# Patient Record
Sex: Male | Born: 1950
Health system: Southern US, Community
[De-identification: ages and names within clinical notes are randomized; demographics above are authoritative.]

## PROBLEM LIST (undated history)

## (undated) DIAGNOSIS — M1990 Unspecified osteoarthritis, unspecified site: Secondary | ICD-10-CM

## (undated) DIAGNOSIS — M7540 Impingement syndrome of unspecified shoulder: Secondary | ICD-10-CM

## (undated) DIAGNOSIS — E890 Postprocedural hypothyroidism: Secondary | ICD-10-CM

## (undated) DIAGNOSIS — G20 Parkinson's disease: Secondary | ICD-10-CM

## (undated) DIAGNOSIS — I10 Essential (primary) hypertension: Secondary | ICD-10-CM

## (undated) DIAGNOSIS — C73 Malignant neoplasm of thyroid gland: Secondary | ICD-10-CM

## (undated) DIAGNOSIS — E785 Hyperlipidemia, unspecified: Secondary | ICD-10-CM

## (undated) DIAGNOSIS — G3185 Corticobasal degeneration: Secondary | ICD-10-CM

## (undated) DIAGNOSIS — G20A1 Parkinson's disease without dyskinesia, without mention of fluctuations: Secondary | ICD-10-CM

## (undated) HISTORY — DX: Postprocedural hypothyroidism: E89.0

## (undated) HISTORY — PX: RETINAL DETACHMENT SURGERY: SHX105

## (undated) HISTORY — PX: CATARACT EXTRACTION: SUR2

## (undated) HISTORY — PX: APPENDECTOMY: SHX54

## (undated) HISTORY — DX: Impingement syndrome of unspecified shoulder: M75.40

## (undated) HISTORY — DX: Malignant neoplasm of thyroid gland: C73

## (undated) HISTORY — DX: Essential (primary) hypertension: I10

## (undated) HISTORY — DX: Hyperlipidemia, unspecified: E78.5

## (undated) HISTORY — DX: Corticobasal degeneration: G31.85

---

## 2010-10-13 ENCOUNTER — Ambulatory Visit: Payer: Self-pay | Admitting: Family Medicine

## 2012-01-24 ENCOUNTER — Ambulatory Visit: Payer: Self-pay | Admitting: Gastroenterology

## 2012-01-24 HISTORY — PX: COLONOSCOPY: SHX174

## 2012-01-25 LAB — PATHOLOGY REPORT

## 2012-03-20 ENCOUNTER — Ambulatory Visit: Payer: Self-pay | Admitting: Family Medicine

## 2012-03-21 ENCOUNTER — Ambulatory Visit: Payer: Self-pay | Admitting: Family Medicine

## 2012-04-03 ENCOUNTER — Ambulatory Visit: Payer: Self-pay | Admitting: Family Medicine

## 2012-04-04 ENCOUNTER — Ambulatory Visit: Payer: Self-pay | Admitting: Family Medicine

## 2014-06-09 ENCOUNTER — Ambulatory Visit: Payer: Self-pay | Admitting: Physician Assistant

## 2014-06-09 ENCOUNTER — Ambulatory Visit: Payer: Self-pay | Admitting: Family Medicine

## 2014-12-26 ENCOUNTER — Other Ambulatory Visit: Payer: Self-pay | Admitting: Family Medicine

## 2014-12-26 DIAGNOSIS — E785 Hyperlipidemia, unspecified: Secondary | ICD-10-CM

## 2014-12-26 DIAGNOSIS — I10 Essential (primary) hypertension: Secondary | ICD-10-CM

## 2014-12-27 ENCOUNTER — Encounter: Payer: Self-pay | Admitting: Family Medicine

## 2014-12-31 ENCOUNTER — Ambulatory Visit (INDEPENDENT_AMBULATORY_CARE_PROVIDER_SITE_OTHER): Payer: Managed Care, Other (non HMO) | Admitting: Family Medicine

## 2014-12-31 ENCOUNTER — Encounter: Payer: Self-pay | Admitting: Family Medicine

## 2014-12-31 VITALS — BP 120/70 | HR 78 | Ht 70.0 in | Wt 175.0 lb

## 2014-12-31 DIAGNOSIS — Z87438 Personal history of other diseases of male genital organs: Secondary | ICD-10-CM

## 2014-12-31 DIAGNOSIS — I10 Essential (primary) hypertension: Secondary | ICD-10-CM | POA: Diagnosis not present

## 2014-12-31 DIAGNOSIS — E785 Hyperlipidemia, unspecified: Secondary | ICD-10-CM

## 2014-12-31 DIAGNOSIS — R58 Hemorrhage, not elsewhere classified: Secondary | ICD-10-CM

## 2014-12-31 DIAGNOSIS — R69 Illness, unspecified: Secondary | ICD-10-CM

## 2014-12-31 DIAGNOSIS — Z1212 Encounter for screening for malignant neoplasm of rectum: Secondary | ICD-10-CM | POA: Diagnosis not present

## 2014-12-31 DIAGNOSIS — Z23 Encounter for immunization: Secondary | ICD-10-CM | POA: Diagnosis not present

## 2014-12-31 LAB — HEMOCCULT GUIAC POC 1CARD (OFFICE): FECAL OCCULT BLD: NEGATIVE

## 2014-12-31 MED ORDER — ATORVASTATIN CALCIUM 20 MG PO TABS: 20.0000 mg | ORAL_TABLET | Freq: Every day | ORAL | Status: DC

## 2014-12-31 MED ORDER — METOPROLOL SUCCINATE ER 100 MG PO TB24: 100.0000 mg | ORAL_TABLET | Freq: Every day | ORAL | Status: DC

## 2014-12-31 MED ORDER — VALSARTAN-HYDROCHLOROTHIAZIDE 80-12.5 MG PO TABS: 1.0000 | ORAL_TABLET | Freq: Every day | ORAL | Status: DC

## 2014-12-31 NOTE — Progress Notes (Signed)
Name: Travis Robertson   MRN: [id number]    DOB: 03/21/1951   Date:12/31/2014       Progress Note  Subjective  Chief Complaint  Chief Complaint  Patient presents with  . Annual Exam  . Hypertension  . Hyperlipidemia    Hypertension This is a recurrent problem. The current episode started more than 1 year ago. The problem has been gradually improving since onset. The problem is controlled. Pertinent negatives include no anxiety, blurred vision, chest pain, headaches, malaise/fatigue, neck pain, orthopnea, palpitations, peripheral edema, PND, shortness of breath or sweats. There are no associated agents to hypertension. Risk factors for coronary artery disease include dyslipidemia, obesity, male gender and smoking/tobacco exposure. Past treatments include angiotensin blockers, beta blockers and diuretics. The current treatment provides moderate improvement. There are no compliance problems.  There is no history of angina, kidney disease, CAD/MI, CVA, heart failure, left ventricular hypertrophy, PVD, renovascular disease or retinopathy. There is no history of chronic renal disease.  Hyperlipidemia This is a recurrent problem. The current episode started more than 1 year ago. The problem is controlled. Recent lipid tests were reviewed and are normal. Exacerbating diseases include obesity. He has no history of chronic renal disease, diabetes, hypothyroidism or liver disease. Factors aggravating his hyperlipidemia include thiazides. Pertinent negatives include no chest pain, focal sensory loss, focal weakness, leg pain, myalgias or shortness of breath. Current antihyperlipidemic treatment includes statins. The current treatment provides mild improvement of lipids. There are no compliance problems.     No problem-specific assessment & plan notes found for this encounter.   Past Medical History  Diagnosis Date  . Hyperlipidemia   . Hypertension     Past Surgical History  Procedure Laterality  Date  . Appendectomy    . Retinal detachment surgery Bilateral   . Cataract extraction Bilateral   . Colonoscopy  2015    cleared for 5 yrs- [facility]    History reviewed. No pertinent family history.  Social History   Social History  . Marital Status: Married    Spouse Name: N/A  . Number of Children: N/A  . Years of Education: N/A   Occupational History  . Not on file.   Social History Main Topics  . Smoking status: Former Research scientist (life sciences)  . Smokeless tobacco: Not on file  . Alcohol Use: 0.0 oz/week    0 Standard drinks or equivalent per week  . Drug Use: No  . Sexual Activity: No   Other Topics Concern  . Not on file   Social History Narrative  . No narrative on file    No Known Allergies   Review of Systems  Constitutional: Negative for fever, chills, weight loss and malaise/fatigue.  HENT: Negative for ear discharge, ear pain and sore throat.   Eyes: Negative for blurred vision.  Respiratory: Negative for cough, sputum production, shortness of breath and wheezing.   Cardiovascular: Negative for chest pain, palpitations, orthopnea, leg swelling and PND.  Gastrointestinal: Negative for heartburn, nausea, abdominal pain, diarrhea, constipation, blood in stool and melena.  Genitourinary: Negative for dysuria, urgency, frequency and hematuria.  Musculoskeletal: Negative for myalgias, back pain, joint pain and neck pain.  Skin: Negative for rash.  Neurological: Negative for dizziness, tingling, sensory change, focal weakness and headaches.  Endo/Heme/Allergies: Negative for environmental allergies and polydipsia. Does not bruise/bleed easily.  Psychiatric/Behavioral: Negative for depression and suicidal ideas. The patient is not nervous/anxious and does not have insomnia.      Objective  Filed Vitals:  12/31/14 0837  BP: 120/70  Pulse: 78  Height: 5\' 10"  (1.778 m)  Weight: 175 lb (79.379 kg)    Physical Exam  Constitutional: He is oriented to person, place, and  time and well-developed, well-nourished, and in no distress.  HENT:  Head: Normocephalic.  Right Ear: External ear normal.  Left Ear: External ear normal.  Nose: Nose normal.  Mouth/Throat: Oropharynx is clear and moist.  Eyes: Conjunctivae and EOM are normal. Pupils are equal, round, and reactive to light. Right eye exhibits no discharge. Left eye exhibits no discharge. No scleral icterus.  Neck: Normal range of motion. Neck supple. No JVD present. No tracheal deviation present. No thyromegaly present.  Cardiovascular: Normal rate, regular rhythm, normal heart sounds and intact distal pulses.  Exam reveals no gallop and no friction rub.   No murmur heard. Pulmonary/Chest: Breath sounds normal. No respiratory distress. He has no wheezes. He has no rales.  Abdominal: Soft. Bowel sounds are normal. He exhibits no mass. There is no hepatosplenomegaly. There is no tenderness. There is no rebound, no guarding and no CVA tenderness.  Genitourinary: Prostate normal. Guaiac positive stool.  Musculoskeletal: Normal range of motion. He exhibits no edema or tenderness.  Lymphadenopathy:    He has no cervical adenopathy.  Neurological: He is alert and oriented to person, place, and time. He has normal sensation, normal strength, normal reflexes and intact cranial nerves. No cranial nerve deficit.  Skin: Skin is warm. Ecchymosis noted. No rash noted.  Psychiatric: Mood and affect normal.      Assessment & Plan  Problem List Items Addressed This Visit      Cardiovascular and Mediastinum   Essential hypertension - Primary   Relevant Medications   aspirin 81 MG tablet   atorvastatin (LIPITOR) 20 MG tablet   metoprolol succinate (TOPROL-XL) 100 MG 24 hr tablet   valsartan-hydrochlorothiazide (DIOVAN-HCT) 80-12.5 MG per tablet   Other Relevant Orders   Renal Function Panel     Other   Hyperlipidemia   Relevant Medications   aspirin 81 MG tablet   atorvastatin (LIPITOR) 20 MG tablet    metoprolol succinate (TOPROL-XL) 100 MG 24 hr tablet   valsartan-hydrochlorothiazide (DIOVAN-HCT) 80-12.5 MG per tablet   Other Relevant Orders   Lipid Profile   Taking multiple medications for chronic disease    Other Visit Diagnoses    Ecchymosis        Relevant Orders    CBC w/Diff/Platelet    History of BPH        Relevant Orders    PSA    Need for influenza vaccination        Relevant Orders    Flu Vaccine QUAD 36+ mos PF IM (Fluarix & Fluzone Quad PF) (Completed)    Screening for rectal cancer        Relevant Orders    POCT Occult Blood Stool (Completed)         Dr. Hashim Eichhorst [facility]  12/31/2014

## 2015-01-01 LAB — RENAL FUNCTION PANEL
Albumin: 4.6 g/dL (ref 3.6–4.8)
BUN / CREAT RATIO: 17 (ref 10–22)
BUN: 16 mg/dL (ref 8–27)
CALCIUM: 9.4 mg/dL (ref 8.6–10.2)
CHLORIDE: 95 mmol/L — AB (ref 97–108)
CO2: 27 mmol/L (ref 18–29)
CREATININE: 0.93 mg/dL (ref 0.76–1.27)
GFR calc Af Amer: 100 mL/min/{1.73_m2} (ref >59)
GFR calc non Af Amer: 86 mL/min/{1.73_m2} (ref >59)
Glucose: 88 mg/dL (ref 65–99)
PHOSPHORUS: 3 mg/dL (ref 2.5–4.5)
POTASSIUM: 4.4 mmol/L (ref 3.5–5.2)
SODIUM: 138 mmol/L (ref 134–144)

## 2015-01-01 LAB — LIPID PANEL
CHOLESTEROL TOTAL: 166 mg/dL (ref 100–199)
Chol/HDL Ratio: 2.4 ratio units (ref 0.0–5.0)
HDL: 68 mg/dL (ref >39)
LDL CALC: 85 mg/dL (ref 0–99)
TRIGLYCERIDES: 63 mg/dL (ref 0–149)
VLDL Cholesterol Cal: 13 mg/dL (ref 5–40)

## 2015-01-01 LAB — CBC WITH DIFFERENTIAL/PLATELET
BASOS ABS: 0.1 10*3/uL (ref 0.0–0.2)
Basos: 1 %
EOS (ABSOLUTE): 0.5 10*3/uL — AB (ref 0.0–0.4)
Eos: 6 %
HEMATOCRIT: 41 % (ref 37.5–51.0)
Hemoglobin: 13.9 g/dL (ref 12.6–17.7)
IMMATURE GRANULOCYTES: 0 %
Immature Grans (Abs): 0 10*3/uL (ref 0.0–0.1)
LYMPHS ABS: 1.8 10*3/uL (ref 0.7–3.1)
Lymphs: 22 %
MCH: 33.3 pg — ABNORMAL HIGH (ref 26.6–33.0)
MCHC: 33.9 g/dL (ref 31.5–35.7)
MCV: 98 fL — ABNORMAL HIGH (ref 79–97)
MONOS ABS: 0.5 10*3/uL (ref 0.1–0.9)
Monocytes: 6 %
NEUTROS PCT: 65 %
Neutrophils Absolute: 5.3 10*3/uL (ref 1.4–7.0)
PLATELETS: 257 10*3/uL (ref 150–379)
RBC: 4.18 x10E6/uL (ref 4.14–5.80)
RDW: 13.3 % (ref 12.3–15.4)
WBC: 8.2 10*3/uL (ref 3.4–10.8)

## 2015-01-01 LAB — PSA: Prostate Specific Ag, Serum: 0.2 ng/mL (ref 0.0–4.0)

## 2015-06-25 ENCOUNTER — Other Ambulatory Visit: Payer: Self-pay | Admitting: Family Medicine

## 2015-06-26 ENCOUNTER — Other Ambulatory Visit: Payer: Self-pay

## 2015-07-15 ENCOUNTER — Encounter: Payer: Self-pay | Admitting: Family Medicine

## 2015-07-15 ENCOUNTER — Ambulatory Visit (INDEPENDENT_AMBULATORY_CARE_PROVIDER_SITE_OTHER): Payer: Managed Care, Other (non HMO) | Admitting: Family Medicine

## 2015-07-15 VITALS — BP 120/70 | HR 64 | Ht 70.0 in | Wt 171.0 lb

## 2015-07-15 DIAGNOSIS — I10 Essential (primary) hypertension: Secondary | ICD-10-CM

## 2015-07-15 DIAGNOSIS — E785 Hyperlipidemia, unspecified: Secondary | ICD-10-CM | POA: Diagnosis not present

## 2015-07-15 DIAGNOSIS — J0100 Acute maxillary sinusitis, unspecified: Secondary | ICD-10-CM

## 2015-07-15 MED ORDER — AMOXICILLIN 500 MG PO CAPS: 500.0000 mg | ORAL_CAPSULE | Freq: Three times a day (TID) | ORAL | Status: DC

## 2015-07-15 MED ORDER — METOPROLOL SUCCINATE ER 100 MG PO TB24: 100.0000 mg | ORAL_TABLET | Freq: Every day | ORAL | Status: DC

## 2015-07-15 MED ORDER — ATORVASTATIN CALCIUM 20 MG PO TABS: 20.0000 mg | ORAL_TABLET | Freq: Every day | ORAL | Status: DC

## 2015-07-15 MED ORDER — VALSARTAN-HYDROCHLOROTHIAZIDE 80-12.5 MG PO TABS: 1.0000 | ORAL_TABLET | Freq: Every day | ORAL | Status: DC

## 2015-07-15 NOTE — Progress Notes (Signed)
Name: Travis Robertson   MRN: [id number]    DOB: 1951/04/09   Date:07/15/2015       Progress Note  Subjective  Chief Complaint  Chief Complaint  Patient presents with  . Hypertension  . Hyperlipidemia  . Sinusitis    nasal production, cough with brown production    Hypertension This is a chronic problem. The current episode started more than 1 year ago. The problem is unchanged. The problem is controlled. Pertinent negatives include no anxiety, blurred vision, chest pain, headaches, malaise/fatigue, neck pain, orthopnea, palpitations, peripheral edema, PND, shortness of breath or sweats. There are no associated agents to hypertension. Past treatments include angiotensin blockers, beta blockers and diuretics. The current treatment provides moderate improvement. There are no compliance problems.  There is no history of angina, kidney disease, CAD/MI, CVA, heart failure, left ventricular hypertrophy, PVD, renovascular disease or retinopathy. There is no history of chronic renal disease or a hypertension causing med.  Hyperlipidemia This is a chronic problem. The problem is controlled. Recent lipid tests were reviewed and are normal. He has no history of chronic renal disease. Pertinent negatives include no chest pain, focal weakness, myalgias or shortness of breath. The current treatment provides moderate improvement of lipids. There are no compliance problems.  Risk factors for coronary artery disease include dyslipidemia and hypertension.  Sinusitis This is a new problem. The current episode started 1 to 4 weeks ago. The problem has been gradually improving since onset. There has been no fever. He is experiencing no pain. Associated symptoms include congestion, coughing and sinus pressure. Pertinent negatives include no chills, diaphoresis, ear pain, headaches, neck pain, shortness of breath, sneezing, sore throat or swollen glands. (Brownish discharge) Past treatments include nothing. The  treatment provided mild relief.    No problem-specific assessment & plan notes found for this encounter.   Past Medical History  Diagnosis Date  . Hyperlipidemia   . Hypertension     Past Surgical History  Procedure Laterality Date  . Appendectomy    . Retinal detachment surgery Bilateral   . Cataract extraction Bilateral   . Colonoscopy  2015    cleared for 5 yrs- [facility]    History reviewed. No pertinent family history.  Social History   Social History  . Marital Status: Married    Spouse Name: N/A  . Number of Children: N/A  . Years of Education: N/A   Occupational History  . Not on file.   Social History Main Topics  . Smoking status: Former Research scientist (life sciences)  . Smokeless tobacco: Not on file  . Alcohol Use: 0.0 oz/week    0 Standard drinks or equivalent per week  . Drug Use: No  . Sexual Activity: No   Other Topics Concern  . Not on file   Social History Narrative    No Known Allergies   Review of Systems  Constitutional: Negative for fever, chills, weight loss, malaise/fatigue and diaphoresis.  HENT: Positive for congestion and sinus pressure. Negative for ear discharge, ear pain, sneezing and sore throat.   Eyes: Negative for blurred vision.  Respiratory: Positive for cough. Negative for sputum production, shortness of breath and wheezing.   Cardiovascular: Negative for chest pain, palpitations, orthopnea, leg swelling and PND.  Gastrointestinal: Negative for heartburn, nausea, abdominal pain, diarrhea, constipation, blood in stool and melena.  Genitourinary: Negative for dysuria, urgency, frequency and hematuria.  Musculoskeletal: Negative for myalgias, back pain, joint pain and neck pain.  Skin: Negative for rash.  Neurological: Negative  for dizziness, tingling, sensory change, focal weakness and headaches.  Endo/Heme/Allergies: Negative for environmental allergies and polydipsia. Does not bruise/bleed easily.  Psychiatric/Behavioral: Negative for  depression and suicidal ideas. The patient is not nervous/anxious and does not have insomnia.      Objective  Filed Vitals:   07/15/15 0856  BP: 120/70  Pulse: 64  Height: 5\' 10"  (1.778 m)  Weight: 171 lb (77.565 kg)    Physical Exam  Constitutional: He is oriented to person, place, and time and well-developed, well-nourished, and in no distress.  HENT:  Head: Normocephalic.  Right Ear: External ear normal.  Left Ear: External ear normal.  Nose: Nose normal.  Mouth/Throat: Oropharynx is clear and moist.  Eyes: Conjunctivae and EOM are normal. Pupils are equal, round, and reactive to light. Right eye exhibits no discharge. Left eye exhibits no discharge. No scleral icterus.  Neck: Normal range of motion. Neck supple. No JVD present. No tracheal deviation present. No thyromegaly present.  Cardiovascular: Normal rate, regular rhythm, normal heart sounds and intact distal pulses.  Exam reveals no gallop and no friction rub.   No murmur heard. Pulmonary/Chest: Breath sounds normal. No respiratory distress. He has no wheezes. He has no rales.  Abdominal: Soft. Bowel sounds are normal. He exhibits no mass. There is no hepatosplenomegaly. There is no tenderness. There is no rebound, no guarding and no CVA tenderness.  Musculoskeletal: Normal range of motion. He exhibits no edema or tenderness.  Lymphadenopathy:    He has no cervical adenopathy.  Neurological: He is alert and oriented to person, place, and time. He has normal sensation, normal strength, normal reflexes and intact cranial nerves. No cranial nerve deficit.  Skin: Skin is warm. No rash noted.  Psychiatric: Mood and affect normal.  Nursing note and vitals reviewed.     Assessment & Plan  Problem List Items Addressed This Visit      Cardiovascular and Mediastinum   Essential hypertension - Primary   Relevant Medications   metoprolol succinate (TOPROL-XL) 100 MG 24 hr tablet   valsartan-hydrochlorothiazide  (DIOVAN-HCT) 80-12.5 MG tablet   atorvastatin (LIPITOR) 20 MG tablet   Other Relevant Orders   Renal Function Panel     Other   Hyperlipidemia   Relevant Medications   metoprolol succinate (TOPROL-XL) 100 MG 24 hr tablet   valsartan-hydrochlorothiazide (DIOVAN-HCT) 80-12.5 MG tablet   atorvastatin (LIPITOR) 20 MG tablet   Other Relevant Orders   Lipid Profile    Other Visit Diagnoses    Acute maxillary sinusitis, recurrence not specified        Relevant Medications    amoxicillin (AMOXIL) 500 MG capsule         Dr. Deanna Jones [facility]  07/15/2015

## 2015-07-16 LAB — RENAL FUNCTION PANEL
Albumin: 4.3 g/dL (ref 3.6–4.8)
BUN / CREAT RATIO: 14 (ref 10–22)
BUN: 14 mg/dL (ref 8–27)
CHLORIDE: 97 mmol/L (ref 96–106)
CO2: 20 mmol/L (ref 18–29)
CREATININE: 0.97 mg/dL (ref 0.76–1.27)
Calcium: 8.9 mg/dL (ref 8.6–10.2)
GFR, EST AFRICAN AMERICAN: 95 mL/min/{1.73_m2} (ref >59)
GFR, EST NON AFRICAN AMERICAN: 82 mL/min/{1.73_m2} (ref >59)
Glucose: 74 mg/dL (ref 65–99)
Phosphorus: 3.6 mg/dL (ref 2.5–4.5)
Potassium: 5 mmol/L (ref 3.5–5.2)
Sodium: 141 mmol/L (ref 134–144)

## 2015-07-16 LAB — LIPID PANEL
CHOLESTEROL TOTAL: 152 mg/dL (ref 100–199)
Chol/HDL Ratio: 2.8 ratio units (ref 0.0–5.0)
HDL: 54 mg/dL (ref >39)
LDL CALC: 84 mg/dL (ref 0–99)
Triglycerides: 68 mg/dL (ref 0–149)
VLDL Cholesterol Cal: 14 mg/dL (ref 5–40)

## 2015-09-09 ENCOUNTER — Other Ambulatory Visit: Payer: Self-pay | Admitting: Family Medicine

## 2016-01-27 ENCOUNTER — Ambulatory Visit (INDEPENDENT_AMBULATORY_CARE_PROVIDER_SITE_OTHER): Payer: Managed Care, Other (non HMO) | Admitting: Family Medicine

## 2016-01-27 ENCOUNTER — Encounter: Payer: Self-pay | Admitting: Family Medicine

## 2016-01-27 VITALS — BP 122/74 | HR 54 | Ht 68.0 in | Wt 169.0 lb

## 2016-01-27 DIAGNOSIS — Z0000 Encounter for general adult medical examination without abnormal findings: Secondary | ICD-10-CM

## 2016-01-27 DIAGNOSIS — Z23 Encounter for immunization: Secondary | ICD-10-CM

## 2016-01-27 DIAGNOSIS — I10 Essential (primary) hypertension: Secondary | ICD-10-CM | POA: Diagnosis not present

## 2016-01-27 DIAGNOSIS — Z1211 Encounter for screening for malignant neoplasm of colon: Secondary | ICD-10-CM | POA: Diagnosis not present

## 2016-01-27 DIAGNOSIS — E785 Hyperlipidemia, unspecified: Secondary | ICD-10-CM

## 2016-01-27 DIAGNOSIS — L570 Actinic keratosis: Secondary | ICD-10-CM | POA: Diagnosis not present

## 2016-01-27 LAB — HEMOCCULT GUIAC POC 1CARD (OFFICE): Fecal Occult Blood, POC: NEGATIVE

## 2016-01-27 MED ORDER — METOPROLOL SUCCINATE ER 100 MG PO TB24: 100.0000 mg | ORAL_TABLET | Freq: Every day | ORAL | 1 refills | Status: DC

## 2016-01-27 MED ORDER — VALSARTAN-HYDROCHLOROTHIAZIDE 80-12.5 MG PO TABS: 1.0000 | ORAL_TABLET | Freq: Every day | ORAL | 1 refills | Status: DC

## 2016-01-27 NOTE — Addendum Note (Signed)
Addended by: Fredderick Severance on: 01/27/2016 02:17 PM   Modules accepted: Orders

## 2016-01-27 NOTE — Progress Notes (Signed)
Name: Travis Robertson   MRN: [id number]    DOB: 1950/06/06   Date:01/27/2016       Progress Note  Subjective  Chief Complaint  Chief Complaint  Patient presents with  . Annual Exam    no issues    Patient presents for annual physical exam.   Hypertension  This is a chronic problem. The current episode started more than 1 year ago. The problem has been gradually improving since onset. The problem is controlled. Pertinent negatives include no anxiety, blurred vision, chest pain, headaches, malaise/fatigue, neck pain, orthopnea, palpitations, peripheral edema, PND, shortness of breath or sweats. There are no associated agents to hypertension. Risk factors for coronary artery disease include dyslipidemia, male gender, smoking/tobacco exposure and post-menopausal state. Past treatments include beta blockers, angiotensin blockers and diuretics. The current treatment provides moderate improvement. There are no compliance problems.  There is no history of angina, kidney disease, CAD/MI, CVA, heart failure, left ventricular hypertrophy, PVD, renovascular disease or retinopathy. There is no history of chronic renal disease or a hypertension causing med.  Hyperlipidemia  This is a chronic problem. The current episode started more than 1 year ago. The problem is controlled. Recent lipid tests were reviewed and are normal. He has no history of chronic renal disease, diabetes, hypothyroidism, liver disease or obesity. There are no known factors aggravating his hyperlipidemia. Pertinent negatives include no chest pain, focal weakness, myalgias or shortness of breath. He is currently on no antihyperlipidemic treatment. The current treatment provides significant improvement of lipids. There are no compliance problems.  Risk factors for coronary artery disease include dyslipidemia, hypertension and male sex.    No problem-specific Assessment & Plan notes found for this encounter.   Past Medical History:   Diagnosis Date  . Hyperlipidemia   . Hypertension     Past Surgical History:  Procedure Laterality Date  . APPENDECTOMY    . CATARACT EXTRACTION Bilateral   . COLONOSCOPY  2015   cleared for 5 yrs- [facility]  . RETINAL DETACHMENT SURGERY Bilateral     History reviewed. No pertinent family history.  Social History   Social History  . Marital status: Married    Spouse name: N/A  . Number of children: N/A  . Years of education: N/A   Occupational History  . Not on file.   Social History Main Topics  . Smoking status: Former Research scientist (life sciences)  . Smokeless tobacco: Not on file  . Alcohol use 0.0 oz/week  . Drug use: No  . Sexual activity: No   Other Topics Concern  . Not on file   Social History Narrative  . No narrative on file    No Known Allergies   Review of Systems  Constitutional: Negative for chills, diaphoresis, fever, malaise/fatigue and weight loss.  HENT: Negative for congestion, ear discharge, ear pain, nosebleeds and sore throat.   Eyes: Negative for blurred vision, double vision, photophobia, pain, discharge and redness.  Respiratory: Negative for cough, hemoptysis, sputum production, shortness of breath, wheezing and stridor.   Cardiovascular: Negative for chest pain, palpitations, orthopnea, claudication, leg swelling and PND.  Gastrointestinal: Negative for abdominal pain, blood in stool, constipation, diarrhea, heartburn, melena, nausea and vomiting.  Genitourinary: Negative for dysuria, frequency, hematuria and urgency.  Musculoskeletal: Negative for back pain, joint pain, myalgias and neck pain.  Skin: Negative for itching and rash.  Neurological: Negative for dizziness, tingling, tremors, sensory change, speech change, focal weakness, seizures, loss of consciousness, weakness and headaches.  Endo/Heme/Allergies: Negative  for environmental allergies and polydipsia. Does not bruise/bleed easily.  Psychiatric/Behavioral: Negative for depression and suicidal  ideas. The patient is not nervous/anxious and does not have insomnia.      Objective  Vitals:   01/27/16 0822  BP: 122/74  Pulse: (!) 54  Weight: 169 lb (76.7 kg)  Height: 5\' 8"  (1.727 m)    Physical Exam  Constitutional: He is oriented to person, place, and time and well-developed, well-nourished, and in no distress.  HENT:  Head: Normocephalic.  Right Ear: Tympanic membrane, external ear and ear canal normal.  Left Ear: Tympanic membrane, external ear and ear canal normal.  Nose: Nose normal.  Mouth/Throat: Uvula is midline and oropharynx is clear and moist. No posterior oropharyngeal edema or posterior oropharyngeal erythema.  Eyes: Conjunctivae and EOM are normal. Pupils are equal, round, and reactive to light. Right eye exhibits no discharge. Left eye exhibits no discharge. No scleral icterus.  Neck: Normal range of motion. Neck supple. No JVD present. No tracheal deviation present. No thyromegaly present.  Cardiovascular: Normal rate, regular rhythm, S1 normal, S2 normal and intact distal pulses.  PMI is not displaced.  Exam reveals no gallop, no S3, no S4 and no friction rub.   No murmur heard. Pulmonary/Chest: Breath sounds normal. No respiratory distress. He has no wheezes. He has no rales.  Abdominal: Soft. Bowel sounds are normal. He exhibits no mass. There is no hepatosplenomegaly. There is no tenderness. There is no rebound, no guarding and no CVA tenderness.  Genitourinary: Rectum normal, prostate normal, testes/scrotum normal and penis normal.  Genitourinary Comments: Testicular atrophy  Musculoskeletal: Normal range of motion. He exhibits no edema or tenderness.  Lymphadenopathy:    He has no cervical adenopathy.  Neurological: He is alert and oriented to person, place, and time. He has normal sensation, normal strength, normal reflexes and intact cranial nerves. No cranial nerve deficit.  Skin: Skin is warm, dry and intact. No rash noted.  Psychiatric: Mood and  affect normal.  Nursing note and vitals reviewed.     Assessment & Plan  Problem List Items Addressed This Visit      Cardiovascular and Mediastinum   Essential hypertension   Relevant Medications   metoprolol succinate (TOPROL-XL) 100 MG 24 hr tablet   valsartan-hydrochlorothiazide (DIOVAN-HCT) 80-12.5 MG tablet     Other   Hyperlipidemia   Relevant Medications   metoprolol succinate (TOPROL-XL) 100 MG 24 hr tablet   valsartan-hydrochlorothiazide (DIOVAN-HCT) 80-12.5 MG tablet    Other Visit Diagnoses    Annual physical exam    -  Primary   lab 30 days/lipid/renal   Relevant Orders   Pneumococcal conjugate vaccine 13-valent (Completed)   POCT Occult Blood Stool (Completed)   Colon cancer screening       Relevant Orders   POCT Occult Blood Stool (Completed)     I spent 30 minutes with this patient, More than 50% of that time was spent in face to face education, counseling and care coordination. We gave a written Rx for Zostavax to pt to take to pharmacy, pt was told to stop Atorvastatin and will have chol rechecked in 30 days. As well as he will receive the TDAP at that time.   Dr. [facility]  01/27/16

## 2016-03-08 ENCOUNTER — Other Ambulatory Visit (INDEPENDENT_AMBULATORY_CARE_PROVIDER_SITE_OTHER): Payer: Managed Care, Other (non HMO)

## 2016-03-08 DIAGNOSIS — R351 Nocturia: Secondary | ICD-10-CM

## 2016-03-08 DIAGNOSIS — E785 Hyperlipidemia, unspecified: Secondary | ICD-10-CM

## 2016-03-08 DIAGNOSIS — Z23 Encounter for immunization: Secondary | ICD-10-CM | POA: Diagnosis not present

## 2016-03-09 ENCOUNTER — Other Ambulatory Visit: Payer: Self-pay

## 2016-03-09 DIAGNOSIS — E785 Hyperlipidemia, unspecified: Secondary | ICD-10-CM

## 2016-03-09 DIAGNOSIS — I10 Essential (primary) hypertension: Secondary | ICD-10-CM

## 2016-03-09 LAB — RENAL FUNCTION PANEL
ALBUMIN: 4.7 g/dL (ref 3.6–4.8)
BUN/Creatinine Ratio: 20 (ref 10–24)
BUN: 20 mg/dL (ref 8–27)
CALCIUM: 9.1 mg/dL (ref 8.6–10.2)
CHLORIDE: 97 mmol/L (ref 96–106)
CO2: 26 mmol/L (ref 18–29)
Creatinine, Ser: 0.99 mg/dL (ref 0.76–1.27)
GFR calc Af Amer: 92 mL/min/{1.73_m2} (ref >59)
GFR calc non Af Amer: 80 mL/min/{1.73_m2} (ref >59)
Glucose: 94 mg/dL (ref 65–99)
PHOSPHORUS: 3.8 mg/dL (ref 2.5–4.5)
Potassium: 4.7 mmol/L (ref 3.5–5.2)
Sodium: 138 mmol/L (ref 134–144)

## 2016-03-09 LAB — LIPID PANEL
CHOLESTEROL TOTAL: 255 mg/dL — AB (ref 100–199)
Chol/HDL Ratio: 3.8 ratio units (ref 0.0–5.0)
HDL: 68 mg/dL (ref >39)
LDL Calculated: 169 mg/dL — ABNORMAL HIGH (ref 0–99)
TRIGLYCERIDES: 91 mg/dL (ref 0–149)
VLDL Cholesterol Cal: 18 mg/dL (ref 5–40)

## 2016-03-09 LAB — PSA: PROSTATE SPECIFIC AG, SERUM: 0.1 ng/mL (ref 0.0–4.0)

## 2016-03-09 MED ORDER — METOPROLOL SUCCINATE ER 100 MG PO TB24: 100.0000 mg | ORAL_TABLET | Freq: Every day | ORAL | 1 refills | Status: DC

## 2016-03-09 MED ORDER — ATORVASTATIN CALCIUM 10 MG PO TABS: 10.0000 mg | ORAL_TABLET | Freq: Every day | ORAL | 1 refills | Status: DC

## 2016-03-09 MED ORDER — VALSARTAN-HYDROCHLOROTHIAZIDE 80-12.5 MG PO TABS: 1.0000 | ORAL_TABLET | Freq: Every day | ORAL | 1 refills | Status: DC

## 2016-04-05 ENCOUNTER — Other Ambulatory Visit: Payer: Self-pay

## 2016-09-01 ENCOUNTER — Ambulatory Visit (INDEPENDENT_AMBULATORY_CARE_PROVIDER_SITE_OTHER): Payer: Medicare Other | Admitting: Family Medicine

## 2016-09-01 ENCOUNTER — Encounter: Payer: Self-pay | Admitting: Family Medicine

## 2016-09-01 VITALS — BP 138/80 | HR 80 | Ht 68.0 in | Wt 163.0 lb

## 2016-09-01 DIAGNOSIS — E785 Hyperlipidemia, unspecified: Secondary | ICD-10-CM | POA: Diagnosis not present

## 2016-09-01 DIAGNOSIS — Z1159 Encounter for screening for other viral diseases: Secondary | ICD-10-CM

## 2016-09-01 DIAGNOSIS — R270 Ataxia, unspecified: Secondary | ICD-10-CM

## 2016-09-01 DIAGNOSIS — I10 Essential (primary) hypertension: Secondary | ICD-10-CM | POA: Diagnosis not present

## 2016-09-01 DIAGNOSIS — H6123 Impacted cerumen, bilateral: Secondary | ICD-10-CM

## 2016-09-01 DIAGNOSIS — Z114 Encounter for screening for human immunodeficiency virus [HIV]: Secondary | ICD-10-CM

## 2016-09-01 DIAGNOSIS — C44319 Basal cell carcinoma of skin of other parts of face: Secondary | ICD-10-CM

## 2016-09-01 DIAGNOSIS — C4401 Basal cell carcinoma of skin of lip: Secondary | ICD-10-CM

## 2016-09-01 MED ORDER — CARBAMIDE PEROXIDE 6.5 % OT SOLN: 5.0000 [drp] | Freq: Two times a day (BID) | OTIC | 0 refills | Status: DC

## 2016-09-01 MED ORDER — ATORVASTATIN CALCIUM 10 MG PO TABS: 10.0000 mg | ORAL_TABLET | Freq: Every day | ORAL | 1 refills | Status: DC

## 2016-09-01 MED ORDER — METOPROLOL SUCCINATE ER 100 MG PO TB24: 100.0000 mg | ORAL_TABLET | Freq: Every day | ORAL | 1 refills | Status: DC

## 2016-09-01 MED ORDER — VALSARTAN-HYDROCHLOROTHIAZIDE 80-12.5 MG PO TABS: 1.0000 | ORAL_TABLET | Freq: Every day | ORAL | 1 refills | Status: DC

## 2016-09-01 NOTE — Progress Notes (Signed)
Name: Travis Robertson   MRN: [id number]    DOB: June 13, 1950   Date:09/01/2016       Progress Note  Subjective  Chief Complaint  Chief Complaint  Patient presents with  . Hypertension  . Hyperlipidemia    1 Place on left cheek for several years no change color or size.  2 placr on right lower lip/noted 1.5 years 3    Hypertension  This is a chronic problem. The current episode started more than 1 year ago. The problem is unchanged. The problem is controlled. Pertinent negatives include no anxiety, blurred vision, chest pain, headaches, malaise/fatigue, neck pain, orthopnea, palpitations, peripheral edema, PND, shortness of breath or sweats. There are no associated agents to hypertension. Risk factors for coronary artery disease include dyslipidemia, male gender and smoking/tobacco exposure. There is no history of chronic renal disease, a hypertension causing med or renovascular disease.  Hyperlipidemia  This is a chronic problem. The current episode started more than 1 year ago. The problem is controlled. Recent lipid tests were reviewed and are normal. He has no history of chronic renal disease, diabetes, hypothyroidism, liver disease, obesity or nephrotic syndrome. Factors aggravating his hyperlipidemia include thiazides. Pertinent negatives include no chest pain, focal sensory loss, focal weakness, leg pain, myalgias or shortness of breath. Current antihyperlipidemic treatment includes statins. There are no compliance problems.  Risk factors for coronary artery disease include hypertension, dyslipidemia and male sex.  Neurologic Problem  The patient's primary symptoms include a loss of balance. The patient's pertinent negatives include no altered mental status, clumsiness, focal sensory loss, focal weakness, memory loss, near-syncope, slurred speech, syncope, visual change or weakness. This is a recurrent problem. The current episode started more than 1 year ago. The neurological problem  developed gradually. The problem has been gradually worsening since onset. Pertinent negatives include no abdominal pain, auditory change, aura, back pain, bladder incontinence, bowel incontinence, chest pain, confusion, diaphoresis, dizziness, fatigue, fever, headaches, light-headedness, nausea, neck pain, palpitations, shortness of breath, vertigo or vomiting. Past treatments include nothing. The treatment provided moderate relief. There is no history of a bleeding disorder, a clotting disorder, a CVA, dementia, head trauma, liver disease, mood changes or seizures.    No problem-specific Assessment & Plan notes found for this encounter.   Past Medical History:  Diagnosis Date  . Hyperlipidemia   . Hypertension     Past Surgical History:  Procedure Laterality Date  . APPENDECTOMY    . CATARACT EXTRACTION Bilateral   . COLONOSCOPY  2015   cleared for 5 yrs- [facility]  . RETINAL DETACHMENT SURGERY Bilateral     No family history on file.  Social History   Social History  . Marital status: Married    Spouse name: N/A  . Number of children: N/A  . Years of education: N/A   Occupational History  . Not on file.   Social History Main Topics  . Smoking status: Former Research scientist (life sciences)  . Smokeless tobacco: Never Used  . Alcohol use 0.0 oz/week  . Drug use: No  . Sexual activity: No   Other Topics Concern  . Not on file   Social History Narrative  . No narrative on file    No Known Allergies  Outpatient Medications Prior to Visit  Medication Sig Dispense Refill  . aspirin 81 MG tablet Take 81 mg by mouth daily.    Marland Kitchen atorvastatin (LIPITOR) 10 MG tablet Take 1 tablet (10 mg total) by mouth daily. 90 tablet 1  .  metoprolol succinate (TOPROL-XL) 100 MG 24 hr tablet Take 1 tablet (100 mg total) by mouth daily. Take with or immediately following a meal. 90 tablet 1  . valsartan-hydrochlorothiazide (DIOVAN-HCT) 80-12.5 MG tablet Take 1 tablet by mouth daily. 90 tablet 1   No  facility-administered medications prior to visit.     Review of Systems  Constitutional: Negative for chills, diaphoresis, fatigue, fever, malaise/fatigue and weight loss.  HENT: Negative for ear discharge, ear pain and sore throat.   Eyes: Negative for blurred vision.  Respiratory: Negative for cough, sputum production, shortness of breath and wheezing.   Cardiovascular: Negative for chest pain, palpitations, orthopnea, leg swelling, PND and near-syncope.  Gastrointestinal: Negative for abdominal pain, blood in stool, bowel incontinence, constipation, diarrhea, heartburn, melena, nausea and vomiting.  Genitourinary: Negative for bladder incontinence, dysuria, frequency, hematuria and urgency.  Musculoskeletal: Negative for back pain, joint pain, myalgias and neck pain.  Skin: Negative for rash.  Neurological: Positive for loss of balance. Negative for dizziness, vertigo, tingling, tremors, sensory change, speech change, focal weakness, seizures, loss of consciousness, syncope, weakness, light-headedness and headaches.  Endo/Heme/Allergies: Negative for environmental allergies and polydipsia. Does not bruise/bleed easily.  Psychiatric/Behavioral: Negative for confusion, depression, memory loss and suicidal ideas. The patient is not nervous/anxious and does not have insomnia.      Objective  Vitals:   09/01/16 0815  BP: 138/80  Pulse: 80  Weight: 163 lb (73.9 kg)  Height: 5\' 8"  (1.727 m)    Physical Exam  Constitutional: He is oriented to person, place, and time and well-developed, well-nourished, and in no distress.  HENT:  Head: Normocephalic.  Right Ear: External ear normal.  Left Ear: External ear normal.  Nose: Nose normal.  Mouth/Throat: Oropharynx is clear and moist.  Bilateral cerumen impaction  Eyes: Conjunctivae and EOM are normal. Pupils are equal, round, and reactive to light. Right eye exhibits no discharge. Left eye exhibits no discharge. No scleral icterus.  Neck:  Normal range of motion. Neck supple. No JVD present. No tracheal deviation present. No thyromegaly present.  Cardiovascular: Normal rate, regular rhythm, normal heart sounds and intact distal pulses.  Exam reveals no gallop and no friction rub.   No murmur heard. Pulmonary/Chest: Breath sounds normal. No respiratory distress. He has no wheezes. He has no rales. He exhibits no tenderness.  Abdominal: Soft. Bowel sounds are normal. He exhibits no mass. There is no hepatosplenomegaly. There is no tenderness. There is no rebound, no guarding and no CVA tenderness.  Musculoskeletal: Normal range of motion. He exhibits no edema or tenderness.  Lymphadenopathy:    He has no cervical adenopathy.  Neurological: He is alert and oriented to person, place, and time. He has normal sensation, normal strength, normal reflexes and intact cranial nerves. No cranial nerve deficit.  Wide base gait/ unable to step up  Skin: Skin is warm. Lesion noted. No rash noted. No erythema. No pallor.  keritinization left face/ papular lesion right lower lip  Psychiatric: Mood and affect normal.  Nursing note and vitals reviewed.     Assessment & Plan  Problem List Items Addressed This Visit      Cardiovascular and Mediastinum   Essential hypertension   Relevant Medications   atorvastatin (LIPITOR) 10 MG tablet   metoprolol succinate (TOPROL-XL) 100 MG 24 hr tablet   valsartan-hydrochlorothiazide (DIOVAN-HCT) 80-12.5 MG tablet   Other Relevant Orders   Renal function panel     Other   Hyperlipidemia   Relevant Medications   atorvastatin (  LIPITOR) 10 MG tablet   metoprolol succinate (TOPROL-XL) 100 MG 24 hr tablet   valsartan-hydrochlorothiazide (DIOVAN-HCT) 80-12.5 MG tablet   Other Relevant Orders   Lipid Profile    Other Visit Diagnoses    Basal cell carcinoma (BCC) of skin of other part of face    -  Primary   Relevant Orders   Ambulatory referral to Dermatology   Basal cell carcinoma (BCC) of lower  lip       Relevant Orders   Ambulatory referral to Dermatology   Ataxia       Relevant Orders   Ambulatory referral to Neurology   Bilateral impacted cerumen       2 wks/ irragation   Relevant Medications   carbamide peroxide (DEBROX) 6.5 % otic solution   Need for hepatitis C screening test       Relevant Orders   Hepatitis C antibody   Encounter for screening for HIV       Relevant Orders   HIV antibody      Meds ordered this encounter  Medications  . atorvastatin (LIPITOR) 10 MG tablet    Sig: Take 1 tablet (10 mg total) by mouth daily.    Dispense:  90 tablet    Refill:  1  . metoprolol succinate (TOPROL-XL) 100 MG 24 hr tablet    Sig: Take 1 tablet (100 mg total) by mouth daily. Take with or immediately following a meal.    Dispense:  90 tablet    Refill:  1  . valsartan-hydrochlorothiazide (DIOVAN-HCT) 80-12.5 MG tablet    Sig: Take 1 tablet by mouth daily.    Dispense:  90 tablet    Refill:  1  . carbamide peroxide (DEBROX) 6.5 % otic solution    Sig: Place 5 drops into both ears 2 (two) times daily.    Dispense:  15 mL    Refill:  0      Dr. Macon [facility]  09/01/16

## 2016-09-02 DIAGNOSIS — D485 Neoplasm of uncertain behavior of skin: Secondary | ICD-10-CM | POA: Diagnosis not present

## 2016-09-02 DIAGNOSIS — L570 Actinic keratosis: Secondary | ICD-10-CM | POA: Diagnosis not present

## 2016-09-02 DIAGNOSIS — C44329 Squamous cell carcinoma of skin of other parts of face: Secondary | ICD-10-CM | POA: Diagnosis not present

## 2016-09-02 LAB — RENAL FUNCTION PANEL
Albumin: 4.6 g/dL (ref 3.6–4.8)
BUN / CREAT RATIO: 24 (ref 10–24)
BUN: 21 mg/dL (ref 8–27)
CALCIUM: 9.5 mg/dL (ref 8.6–10.2)
CO2: 24 mmol/L (ref 18–29)
CREATININE: 0.89 mg/dL (ref 0.76–1.27)
Chloride: 98 mmol/L (ref 96–106)
GFR calc Af Amer: 104 mL/min/{1.73_m2} (ref >59)
GFR calc non Af Amer: 90 mL/min/{1.73_m2} (ref >59)
Glucose: 93 mg/dL (ref 65–99)
Phosphorus: 4 mg/dL (ref 2.5–4.5)
Potassium: 4.3 mmol/L (ref 3.5–5.2)
Sodium: 141 mmol/L (ref 134–144)

## 2016-09-02 LAB — LIPID PANEL
CHOLESTEROL TOTAL: 168 mg/dL (ref 100–199)
Chol/HDL Ratio: 2.8 ratio (ref 0.0–5.0)
HDL: 60 mg/dL (ref >39)
LDL CALC: 92 mg/dL (ref 0–99)
TRIGLYCERIDES: 80 mg/dL (ref 0–149)
VLDL CHOLESTEROL CAL: 16 mg/dL (ref 5–40)

## 2016-09-02 LAB — HIV ANTIBODY (ROUTINE TESTING W REFLEX): HIV Screen 4th Generation wRfx: NONREACTIVE

## 2016-09-02 LAB — HEPATITIS C ANTIBODY

## 2016-09-09 ENCOUNTER — Ambulatory Visit (INDEPENDENT_AMBULATORY_CARE_PROVIDER_SITE_OTHER): Payer: Medicare Other | Admitting: Family Medicine

## 2016-09-09 ENCOUNTER — Encounter: Payer: Self-pay | Admitting: Family Medicine

## 2016-09-09 VITALS — BP 134/80 | HR 72 | Ht 68.0 in | Wt 164.0 lb

## 2016-09-09 DIAGNOSIS — H6123 Impacted cerumen, bilateral: Secondary | ICD-10-CM

## 2016-09-09 NOTE — Progress Notes (Signed)
Name: Travis Robertson   MRN: [id number]    DOB: [birth year]   Date:09/09/2016       Progress Note  Subjective  Chief Complaint  Chief Complaint  Patient presents with  . decrease hearing    needs ears washed out    Patient presents for irragation to remove cerumen.    No problem-specific Assessment & Plan notes found for this encounter.   Past Medical History:  Diagnosis Date  . Hyperlipidemia   . Hypertension     Past Surgical History:  Procedure Laterality Date  . APPENDECTOMY    . CATARACT EXTRACTION Bilateral   . COLONOSCOPY  2015   cleared for 5 yrs- [facility]  . RETINAL DETACHMENT SURGERY Bilateral     No family history on file.  Social History   Social History  . Marital status: Married    Spouse name: N/A  . Number of children: N/A  . Years of education: N/A   Occupational History  . Not on file.   Social History Main Topics  . Smoking status: Former Research scientist (life sciences)  . Smokeless tobacco: Never Used  . Alcohol use 0.0 oz/week  . Drug use: No  . Sexual activity: No   Other Topics Concern  . Not on file   Social History Narrative  . No narrative on file    No Known Allergies  Outpatient Medications Prior to Visit  Medication Sig Dispense Refill  . aspirin 81 MG tablet Take 81 mg by mouth daily.    Marland Kitchen atorvastatin (LIPITOR) 10 MG tablet Take 1 tablet (10 mg total) by mouth daily. 90 tablet 1  . carbamide peroxide (DEBROX) 6.5 % otic solution Place 5 drops into both ears 2 (two) times daily. 15 mL 0  . metoprolol succinate (TOPROL-XL) 100 MG 24 hr tablet Take 1 tablet (100 mg total) by mouth daily. Take with or immediately following a meal. 90 tablet 1  . valsartan-hydrochlorothiazide (DIOVAN-HCT) 80-12.5 MG tablet Take 1 tablet by mouth daily. 90 tablet 1   No facility-administered medications prior to visit.     Review of Systems  Constitutional: Negative for chills and fever.  HENT: Positive for hearing loss. Negative for tinnitus.    Neurological: Negative for dizziness.     Objective  Vitals:   09/09/16 1401  BP: 134/80  Pulse: 72  Weight: 164 lb (74.4 kg)  Height: 5\' 8"  (1.727 m)    Physical Exam  Constitutional: He is oriented to person, place, and time and well-developed, well-nourished, and in no distress.  HENT:  Head: Normocephalic.  Right Ear: External ear normal. A foreign body is present.  Left Ear: External ear normal. A foreign body is present.  Nose: Nose normal.  Mouth/Throat: Oropharynx is clear and moist.  Cerumen removed with irragation  Eyes: Conjunctivae and EOM are normal. Pupils are equal, round, and reactive to light.  Neck: Normal range of motion. Neck supple.  Cardiovascular: Normal rate, regular rhythm, normal heart sounds and intact distal pulses.   Pulmonary/Chest: Effort normal and breath sounds normal.  Abdominal: Soft. Bowel sounds are normal. There is no hepatosplenomegaly. There is no CVA tenderness.  Musculoskeletal: Normal range of motion.  Neurological: He is alert and oriented to person, place, and time. He has normal sensation, normal strength, normal reflexes and intact cranial nerves.  Skin: Skin is warm. No rash noted.  Psychiatric: Mood and affect normal.  Nursing note and vitals reviewed.     Assessment & Plan  Problem List  Items Addressed This Visit    None    Visit Diagnoses    Bilateral impacted cerumen    -  Primary      No orders of the defined types were placed in this encounter.     Dr. [facility]  09/09/16

## 2016-09-22 DIAGNOSIS — C44329 Squamous cell carcinoma of skin of other parts of face: Secondary | ICD-10-CM | POA: Diagnosis not present

## 2016-09-30 DIAGNOSIS — L570 Actinic keratosis: Secondary | ICD-10-CM | POA: Diagnosis not present

## 2016-10-11 DIAGNOSIS — F0280 Dementia in other diseases classified elsewhere without behavioral disturbance: Secondary | ICD-10-CM | POA: Diagnosis not present

## 2016-10-11 DIAGNOSIS — R42 Dizziness and giddiness: Secondary | ICD-10-CM | POA: Diagnosis not present

## 2016-10-11 DIAGNOSIS — E538 Deficiency of other specified B group vitamins: Secondary | ICD-10-CM | POA: Diagnosis not present

## 2016-10-11 DIAGNOSIS — G300 Alzheimer's disease with early onset: Secondary | ICD-10-CM | POA: Diagnosis not present

## 2016-10-11 DIAGNOSIS — R270 Ataxia, unspecified: Secondary | ICD-10-CM | POA: Diagnosis not present

## 2016-10-12 DIAGNOSIS — R270 Ataxia, unspecified: Secondary | ICD-10-CM | POA: Insufficient documentation

## 2016-10-12 DIAGNOSIS — R42 Dizziness and giddiness: Secondary | ICD-10-CM | POA: Insufficient documentation

## 2016-10-12 DIAGNOSIS — E538 Deficiency of other specified B group vitamins: Secondary | ICD-10-CM | POA: Insufficient documentation

## 2016-10-20 ENCOUNTER — Other Ambulatory Visit: Payer: Self-pay | Admitting: Neurology

## 2016-10-20 DIAGNOSIS — L821 Other seborrheic keratosis: Secondary | ICD-10-CM | POA: Diagnosis not present

## 2016-10-20 DIAGNOSIS — R42 Dizziness and giddiness: Secondary | ICD-10-CM

## 2016-10-20 DIAGNOSIS — D229 Melanocytic nevi, unspecified: Secondary | ICD-10-CM | POA: Diagnosis not present

## 2016-10-20 DIAGNOSIS — L570 Actinic keratosis: Secondary | ICD-10-CM | POA: Diagnosis not present

## 2016-10-20 DIAGNOSIS — R270 Ataxia, unspecified: Secondary | ICD-10-CM

## 2016-10-22 DIAGNOSIS — R531 Weakness: Secondary | ICD-10-CM | POA: Diagnosis not present

## 2016-10-26 ENCOUNTER — Encounter: Payer: Self-pay | Admitting: Physical Therapy

## 2016-10-26 ENCOUNTER — Ambulatory Visit: Payer: Medicare Other | Attending: Neurology | Admitting: Physical Therapy

## 2016-10-26 DIAGNOSIS — R2681 Unsteadiness on feet: Secondary | ICD-10-CM | POA: Insufficient documentation

## 2016-10-26 DIAGNOSIS — R269 Unspecified abnormalities of gait and mobility: Secondary | ICD-10-CM

## 2016-10-26 DIAGNOSIS — R2689 Other abnormalities of gait and mobility: Secondary | ICD-10-CM

## 2016-10-26 DIAGNOSIS — M62561 Muscle wasting and atrophy, not elsewhere classified, right lower leg: Secondary | ICD-10-CM | POA: Diagnosis not present

## 2016-10-26 NOTE — Therapy (Signed)
[facility] [address] [place], [place], [address] Phone: [phone]   Fax:  [phone]  Physical Therapy Evaluation  Patient Details  Name: Travis Robertson MRN: [id number] Date of Birth: [birth year] Referring Provider: Dr. Melrose Nakayama  Encounter Date: 10/26/2016      PT End of Session - 10/26/16 1358    Visit Number 1   Number of Visits 8   Date for PT Re-Evaluation 11/23/16   Authorization - Visit Number 1   Authorization - Number of Visits 10   PT Start Time 2542   PT Stop Time 1128   PT Time Calculation (min) 65 min   Equipment Utilized During Treatment Gait belt   Activity Tolerance Patient tolerated treatment well   Behavior During Therapy [facility] for tasks assessed/performed      Past Medical History:  Diagnosis Date  . Hyperlipidemia   . Hypertension     Past Surgical History:  Procedure Laterality Date  . APPENDECTOMY    . CATARACT EXTRACTION Bilateral   . COLONOSCOPY  2015   cleared for 5 yrs- [facility]  . RETINAL DETACHMENT SURGERY Bilateral     There were no vitals filed for this visit.       Subjective Assessment - 10/26/16 1123    Subjective Patient reports unsteadiness in gait and balance with walking.  Pt. walks 3-4 miles 5-6x/week.  Pt. reports difficulty maintaining a straight path and states he sways more recently.  No falls reported.  No tremors noted.   Patient reports normal EMG and has an MRI on July 10.    Limitations Walking   Patient Stated Goals Increase balance/ gait endurance.     Currently in Pain? No/denies       Outcome Measures:    Berg Balance Test: 53/56 6 Minute Walk Test: 1520ft 5 Times Sit to Stand: 12.93s Timed Up and Go: 8.61s   Objective measurements completed on examination: See above findings.    See HEP      PT Education - 10/26/16 1124    Education provided Yes   Education Details HEP of SLS, tandem stance, and step ups. Educated on safety during HEP    Person(s) Educated Patient   Methods Explanation;Demonstration;Handout   Comprehension Verbalized understanding             PT Long Term Goals - 10/26/16 1141      PT LONG TERM GOAL #1   Title Pt. will increase LEFS to >70 out of 80 to improve functional mobility.     Baseline LEFS on 7/3 is 58/80.     Time 4   Period Weeks   Status New     PT LONG TERM GOAL #2   Title Patient will increase his 6MWT to 1875 ft in order to increase functional mobility and ability to participate in community activities   Baseline 1595 feet on 7/3   Time 4   Period Weeks   Status New     PT LONG TERM GOAL #3   Title Patient independent with HEP to decrease his 5 times sit to stand time to 10s in order to decrease his fall risk   Baseline 12.93s   Time 4   Period Weeks   Status New     PT LONG TERM GOAL #4   Title Pt. will ambulate with more normalized gait pattern/ increase R arm swing with consistent heel strike to improve mobility/ decrease fall risk.  Baseline No R arm swing.  Self-reported walking issues on uneven terrain.     Time 4   Period Weeks   Status New          Plan - 10-30-16 1137    Clinical Impression Statement Patient is a 66 y/o male who presents with unsteadiness in gait and decreased dynamic standing balance.  Pt. reports no pain or recent falls.  Patient presents with 5/5 MMT strength, but has noticeable atrophy of right calf.   Patient demonstrates decreased R arm swing and also states he has noticed swallowing issues that have started within the last 6 months.  Pt. has noticed difficulty with walking on uneven terrain during daily walking.  Berg balance: 53/56.  LEFS: 58 out of 80.  Patient will benefit from skilled physical therapy in order to increase his dynamic standing balance activity and independence with gait.      History and Personal Factors relevant to plan of care: Patient states mother struggled with esophageal problems and he thinks he has the  same issue she does.   Clinical Presentation Stable   Clinical Decision Making Moderate   Rehab Potential Good   PT Frequency 2x / week   PT Duration 4 weeks   PT Treatment/Interventions ADLs/Self Care Home Management;Neuromuscular re-education;Balance training;Therapeutic exercise;Therapeutic activities;Functional mobility training;Patient/family education;Stair training;Manual techniques;Gait training   PT Next Visit Plan balance exercises   PT Home Exercise Plan SLS, tandem stance, step ups to stair   Consulted and Agree with Plan of Care Patient      Patient will benefit from skilled therapeutic intervention in order to improve the following deficits and impairments:  Abnormal gait, Decreased balance, Decreased coordination, Decreased strength, Decreased mobility, Decreased activity tolerance, Decreased endurance  Visit Diagnosis: Unsteadiness on feet  Gait difficulty  Impairment of balance  Atrophy of calf muscles on right      G-Codes - 10/30/16 1408    Functional Assessment Tool Used (Outpatient Only) Clinical impression/ LEFS/ Berg/ gait difficulty   Functional Limitation Mobility: Walking and moving around   Mobility: Walking and Moving Around Current Status (W9794) At least 1 percent but less than 20 percent impaired, limited or restricted   Mobility: Walking and Moving Around Goal Status (I0165) 0 percent impaired, limited or restricted       Problem List Patient Active Problem List   Diagnosis Date Noted  . Essential hypertension 12/31/2014  . Hyperlipidemia 12/31/2014  . Taking multiple medications for chronic disease 12/31/2014   Pura Spice, PT, DPT # [id number] 10/27/2016, 1:15 PM  [facility] [address] [place], [place], [address] Phone: [phone]   Fax:  [phone]  Name: Travis Robertson MRN: [id number] Date of Birth: Apr 05, 1951

## 2016-10-28 DIAGNOSIS — R531 Weakness: Secondary | ICD-10-CM | POA: Insufficient documentation

## 2016-10-30 ENCOUNTER — Encounter: Payer: Self-pay | Admitting: Physical Therapy

## 2016-11-01 ENCOUNTER — Encounter: Payer: Self-pay | Admitting: Physical Therapy

## 2016-11-01 ENCOUNTER — Ambulatory Visit: Payer: Medicare Other | Admitting: Physical Therapy

## 2016-11-01 DIAGNOSIS — R2681 Unsteadiness on feet: Secondary | ICD-10-CM | POA: Diagnosis not present

## 2016-11-01 DIAGNOSIS — M62561 Muscle wasting and atrophy, not elsewhere classified, right lower leg: Secondary | ICD-10-CM

## 2016-11-01 DIAGNOSIS — R269 Unspecified abnormalities of gait and mobility: Secondary | ICD-10-CM | POA: Diagnosis not present

## 2016-11-01 DIAGNOSIS — R2689 Other abnormalities of gait and mobility: Secondary | ICD-10-CM | POA: Diagnosis not present

## 2016-11-01 NOTE — Therapy (Signed)
[facility] [address] [place], [place], [address] Phone: [phone]   Fax:  [phone]  Physical Therapy Treatment  Patient Details  Name: Travis Robertson MRN: [id number] Date of Birth: 18-Mar-1951 Referring Provider: Dr. Melrose Nakayama  Encounter Date: 11/01/2016      PT End of Session - 11/01/16 1136    Visit Number 2   Number of Visits 8   Date for PT Re-Evaluation 11/23/16   Authorization - Visit Number 2   Authorization - Number of Visits 10   PT Start Time 1028   PT Stop Time 1129   PT Time Calculation (min) 61 min   Equipment Utilized During Treatment Gait belt   Activity Tolerance Patient tolerated treatment well   Behavior During Therapy [facility] for tasks assessed/performed      Past Medical History:  Diagnosis Date  . Hyperlipidemia   . Hypertension     Past Surgical History:  Procedure Laterality Date  . APPENDECTOMY    . CATARACT EXTRACTION Bilateral   . COLONOSCOPY  2015   cleared for 5 yrs- [facility]  . RETINAL DETACHMENT SURGERY Bilateral     There were no vitals filed for this visit.      Subjective Assessment - 11/01/16 1045    Subjective Pt. states he walked several miles this morning.  No balance issues/ falls but notices a drifting/ sway while walking on sidewalk.  Pt. has MRI scheduled for tomorrow.     Limitations Walking   Patient Stated Goals Increase balance/ gait endurance.     Currently in Pain? No/denies       Therex:  Scifit L6 10 min. B UE/LE (warm-up/no charge).     Neuro.mm.:  Airex (NBOS with head turns/ extension/ EC).  Tandem stance on land/ Airex (added head turns)- increase  difficulty with R LE posterior to L.        SLS on land L/R >50 sec. (Reviewed HEP).      SLS on Airex L/R (10-15 sec.)- slight difficulty.      Tandem gait with Airex beam in //-bars (min. To no UE assist).  Lateral step pattern in //-bars with no UE   assist.     Reverse BOSU wt. Shifting L/R  (difficulty).      Hallway activities (high marching/ alt. UE and LE (difficulty with sequencing)/ braiding/ EC walking with   min. Assist from PT.      Walking in hallway/ outside working on technique (no R arm swing noted).            PT Long Term Goals - 10/26/16 1141      PT LONG TERM GOAL #1   Title Pt. will increase LEFS to >70 out of 80 to improve functional mobility.     Baseline LEFS on 7/3 is 58/80.     Time 4   Period Weeks   Status New     PT LONG TERM GOAL #2   Title Patient will increase his 6MWT to 1875 ft in order to increase functional mobility and ability to participate in community activities   Baseline 1595 feet on 7/3   Time 4   Period Weeks   Status New     PT LONG TERM GOAL #3   Title Patient independent with HEP to decrease his 5 times sit to stand time to 10s in order to decrease his fall risk   Baseline 12.93s   Time 4   Period  Weeks   Status New     PT LONG TERM GOAL #4   Title Pt. will ambulate with more normalized gait pattern/ increase R arm swing with consistent heel strike to improve mobility/ decrease fall risk.     Baseline No R arm swing.  Self-reported walking issues on uneven terrain.     Time 4   Period Weeks   Status New           Plan - 11/01/16 1137    Clinical Impression Statement Pt. demonstrates ability to tolerate dynamic standing balance activities and required verbal and tactile cueing to maintain correct position during tandem stance and BOSU weight shifts. Pt. tolerated standing on LLE more than RLE, and states his RLE feels "more tired" than his LLE. Pt. showed increased difficulty with airex tandem walking and required BUE use to continue. Pt. will continue to benefit from skilled PT in order to improve dynamic standing balance activities and decrease fall risk.   Clinical Presentation Stable   Clinical Decision Making Moderate   Rehab Potential Good   PT Frequency 2x / week   PT Duration 4 weeks   PT  Treatment/Interventions ADLs/Self Care Home Management;Neuromuscular re-education;Balance training;Therapeutic exercise;Therapeutic activities;Functional mobility training;Patient/family education;Stair training;Manual techniques;Gait training   PT Next Visit Plan balance exercises/ discuss MRI   PT Home Exercise Plan SLS, tandem stance, step ups to stair   Consulted and Agree with Plan of Care Patient      Patient will benefit from skilled therapeutic intervention in order to improve the following deficits and impairments:  Abnormal gait, Decreased balance, Decreased coordination, Decreased strength, Decreased mobility, Decreased activity tolerance, Decreased endurance  Visit Diagnosis: Gait difficulty  Unsteadiness on feet  Impairment of balance  Atrophy of calf muscles on right     Problem List Patient Active Problem List   Diagnosis Date Noted  . Essential hypertension 12/31/2014  . Hyperlipidemia 12/31/2014  . Taking multiple medications for chronic disease 12/31/2014   Pura Spice, PT, DPT # [id number] 11/01/2016, 1:14 PM  [facility] [address] [place], [place], [address] Phone: [phone]   Fax:  [phone]  Name: Travis Robertson MRN: [id number] Date of Birth: August 01, 1950

## 2016-11-02 ENCOUNTER — Ambulatory Visit: Payer: Medicare Other

## 2016-11-02 ENCOUNTER — Ambulatory Visit
Admission: RE | Admit: 2016-11-02 | Discharge: 2016-11-02 | Disposition: A | Discharge status: Home / Self Care | Payer: Medicare Other | Source: Ambulatory Visit | Attending: Neurology | Admitting: Neurology

## 2016-11-02 DIAGNOSIS — R42 Dizziness and giddiness: Secondary | ICD-10-CM | POA: Diagnosis not present

## 2016-11-02 DIAGNOSIS — R270 Ataxia, unspecified: Secondary | ICD-10-CM | POA: Diagnosis not present

## 2016-11-02 DIAGNOSIS — J328 Other chronic sinusitis: Secondary | ICD-10-CM | POA: Insufficient documentation

## 2016-11-02 DIAGNOSIS — R600 Localized edema: Secondary | ICD-10-CM | POA: Diagnosis not present

## 2016-11-04 ENCOUNTER — Ambulatory Visit: Payer: Medicare Other | Admitting: Physical Therapy

## 2016-11-04 ENCOUNTER — Encounter: Payer: Self-pay | Admitting: Physical Therapy

## 2016-11-04 DIAGNOSIS — R2689 Other abnormalities of gait and mobility: Secondary | ICD-10-CM | POA: Diagnosis not present

## 2016-11-04 DIAGNOSIS — R269 Unspecified abnormalities of gait and mobility: Secondary | ICD-10-CM

## 2016-11-04 DIAGNOSIS — R2681 Unsteadiness on feet: Secondary | ICD-10-CM | POA: Diagnosis not present

## 2016-11-04 DIAGNOSIS — M62561 Muscle wasting and atrophy, not elsewhere classified, right lower leg: Secondary | ICD-10-CM

## 2016-11-04 NOTE — Therapy (Signed)
[facility] [address] [place], [place], [address] Phone: [phone]   Fax:  [phone]  Physical Therapy Treatment  Patient Details  Name: Travis Robertson MRN: [id number] Date of Birth: [birth year] Referring Provider: Dr. Melrose Nakayama  Encounter Date: 11/04/2016      PT End of Session - 11/04/16 1159    Visit Number 3   Number of Visits 8   Date for PT Re-Evaluation 11/23/16   Authorization - Visit Number 3   Authorization - Number of Visits 10   PT Start Time 1027   PT Stop Time 1114   PT Time Calculation (min) 47 min   Equipment Utilized During Treatment Gait belt   Activity Tolerance Patient tolerated treatment well   Behavior During Therapy [facility] for tasks assessed/performed      Past Medical History:  Diagnosis Date  . Hyperlipidemia   . Hypertension     Past Surgical History:  Procedure Laterality Date  . APPENDECTOMY    . CATARACT EXTRACTION Bilateral   . COLONOSCOPY  2015   cleared for 5 yrs- K C Docs  . RETINAL DETACHMENT SURGERY Bilateral     There were no vitals filed for this visit.      Subjective Assessment - 11/04/16 1156    Subjective Pt. reports he walked 5 miles this morning and feels really off today. He thinks some of his balance issues and feeling "off" is happening due to stress. Pt. denies pain and recent falls.   MRI results are negative (see imaging clinical impression).     Limitations Walking   Patient Stated Goals Increase balance/ gait endurance.     Currently in Pain? No/denies   Multiple Pain Sites No      Neuro Re-ed: Obstacle course x2 with airex step-ups, tandem walking on airex beam, stepping over balance boards, and carrying 7lb box Tandem walking on airex beam x5 laps Tandem stance on airex beam 5x30s Tandem stance on airex beam with head turns to R and L x3 minutes. Pt. Demonstrated several losses of balance only occurring with pt's head turned to L. Outside walking  with head turns R and L, and up and down x428ft Resisted walking x50lbs forward, backward, L and R x7 each direction Stair training x45min. Pt. Demonstrated fear of falling during descending stairs and stated he doesn't trust his legs. Single leg squat x84min Stair training x23min. After single leg squats, pt. Demonstrated increased ability to descend stairs without using hand rails.        PT Education - 11/04/16 1158    Education provided Yes   Education Details HEP of single leg squat for eccentric quad activation   Person(s) Educated Patient   Methods Explanation;Demonstration   Comprehension Verbalized understanding;Returned demonstration;Verbal cues required             PT Long Term Goals - 10/26/16 1141      PT LONG TERM GOAL #1   Title Pt. will increase LEFS to >70 out of 80 to improve functional mobility.     Baseline LEFS on 7/3 is 58/80.     Time 4   Period Weeks   Status New     PT LONG TERM GOAL #2   Title Patient will increase his 6MWT to 1875 ft in order to increase functional mobility and ability to participate in community activities   Baseline 1595 feet on 7/3   Time 4   Period Weeks   Status New  PT LONG TERM GOAL #3   Title Patient independent with HEP to decrease his 5 times sit to stand time to 10s in order to decrease his fall risk   Baseline 12.93s   Time 4   Period Weeks   Status New     PT LONG TERM GOAL #4   Title Pt. will ambulate with more normalized gait pattern/ increase R arm swing with consistent heel strike to improve mobility/ decrease fall risk.     Baseline No R arm swing.  Self-reported walking issues on uneven terrain.     Time 4   Period Weeks   Status New               Plan - 11/04/16 1200    Clinical Impression Statement Pt. demonstrates improved balance with ability to tolerate tandem walking on airex balance beam with fewer losses of balance over time. Pt. demonstrated difficulty with head turns while walking  and had minor deviations in gait while turning head to left. PT noted every loss of balance during head turns was during head turns to left. Pt. also demonstrated difficulty with descending steps without using rails, but showed major improvement after practicing single leg squats to increase eccentric quadriceps activation. Pt. tolerated treatment well and required only one rest break.    Clinical Presentation Stable   Rehab Potential Good   PT Frequency 2x / week   PT Duration 4 weeks   PT Treatment/Interventions ADLs/Self Care Home Management;Neuromuscular re-education;Balance training;Therapeutic exercise;Therapeutic activities;Functional mobility training;Patient/family education;Stair training;Manual techniques;Gait training   PT Next Visit Plan balance exercises   PT Home Exercise Plan SLS, tandem stance, step ups to stair   Consulted and Agree with Plan of Care Patient      Patient will benefit from skilled therapeutic intervention in order to improve the following deficits and impairments:  Abnormal gait, Decreased balance, Decreased coordination, Decreased strength, Decreased mobility, Decreased activity tolerance, Decreased endurance  Visit Diagnosis: Gait difficulty  Unsteadiness on feet  Impairment of balance  Atrophy of calf muscles on right     Problem List Patient Active Problem List   Diagnosis Date Noted  . Essential hypertension 12/31/2014  . Hyperlipidemia 12/31/2014  . Taking multiple medications for chronic disease 12/31/2014   Pura Spice, PT, DPT # [id number] Betsy Coder, SPT 11/05/2016, 10:49 AM  [facility] [address] [place], [place], [address] Phone: [phone]   Fax:  [phone]  Name: Trino Higinbotham MRN: [id number] Date of Birth: October 22, 1950

## 2016-11-08 ENCOUNTER — Encounter: Payer: Self-pay | Admitting: Physical Therapy

## 2016-11-08 ENCOUNTER — Ambulatory Visit: Payer: Medicare Other | Admitting: Physical Therapy

## 2016-11-08 DIAGNOSIS — M62561 Muscle wasting and atrophy, not elsewhere classified, right lower leg: Secondary | ICD-10-CM | POA: Diagnosis not present

## 2016-11-08 DIAGNOSIS — R269 Unspecified abnormalities of gait and mobility: Secondary | ICD-10-CM

## 2016-11-08 DIAGNOSIS — R2689 Other abnormalities of gait and mobility: Secondary | ICD-10-CM | POA: Diagnosis not present

## 2016-11-08 DIAGNOSIS — R2681 Unsteadiness on feet: Secondary | ICD-10-CM

## 2016-11-08 NOTE — Therapy (Signed)
[place] [facility] [address] [place], [place], [address] Phone: [phone]   Fax:  [phone]  Physical Therapy Treatment  Patient Details  Name: Travis Robertson MRN: [id number] Date of Birth: 1950/10/16 Referring Provider: Dr. Melrose Nakayama  Encounter Date: 11/08/2016      PT End of Session - 11/08/16 1726    Visit Number 4   Number of Visits 8   Date for PT Re-Evaluation 11/23/16   Authorization - Visit Number 4   Authorization - Number of Visits 10   PT Start Time 0350   PT Stop Time 1116   PT Time Calculation (min) 45 min   Equipment Utilized During Treatment Gait belt   Activity Tolerance Patient tolerated treatment well   Behavior During Therapy [facility] for tasks assessed/performed      Past Medical History:  Diagnosis Date  . Hyperlipidemia   . Hypertension     Past Surgical History:  Procedure Laterality Date  . APPENDECTOMY    . CATARACT EXTRACTION Bilateral   . COLONOSCOPY  2015   cleared for 5 yrs- K C Docs  . RETINAL DETACHMENT SURGERY Bilateral     There were no vitals filed for this visit.      Subjective Assessment - 11/08/16 1725    Subjective Pt. presents to PT in good spirits, denies pain and recent falls. Pt. states he still thinks something is "off" but does feel some better. He notices symptoms have not resolved but believes they are improving.   Limitations Walking   Patient Stated Goals Increase balance/ gait endurance.     Currently in Pain? No/denies   Multiple Pain Sites No       Treatment:  SciFit elliptical L6 10 min (warm up/not billed) Gait training 117ft Bilateral calf raises in // bars x25 Single leg calf raises in // bars x25 each leg; required use of UEs Squatting on airex foam x20 Tandem walking on airex balance beam x67min Tandem stance on airex balance beam x60min Stair training x74min Single leg squat on 6in step x20 each leg         PT Education - 11/08/16 1726    Education provided Yes   Education Details continuance of HEP   Person(s) Educated Patient   Methods Explanation   Comprehension Verbalized understanding;Returned demonstration             PT Long Term Goals - 10/26/16 1141      PT LONG TERM GOAL #1   Title Pt. will increase LEFS to >70 out of 80 to improve functional mobility.     Baseline LEFS on 7/3 is 58/80.     Time 4   Period Weeks   Status New     PT LONG TERM GOAL #2   Title Patient will increase his 6MWT to 1875 ft in order to increase functional mobility and ability to participate in community activities   Baseline 1595 feet on 7/3   Time 4   Period Weeks   Status New     PT LONG TERM GOAL #3   Title Patient independent with HEP to decrease his 5 times sit to stand time to 10s in order to decrease his fall risk   Baseline 12.93s   Time 4   Period Weeks   Status New     PT LONG TERM GOAL #4   Title Pt. will ambulate with more normalized gait pattern/ increase R arm swing with consistent heel strike to  improve mobility/ decrease fall risk.     Baseline No R arm swing.  Self-reported walking issues on uneven terrain.     Time 4   Period Weeks   Status New            Plan - 11/08/16 1727    Clinical Impression Statement Pt. demonstrates improved performance in all activities with practice, and continues to show improvement in tandem walking on airex balance beam. Pt. demonstrates increased ability to descend stairs without use of handrail but states he has to pause before the first step to "get his mind right." Pt. able to tolerate treatment without a rest break.   Clinical Presentation Stable   Clinical Decision Making Moderate   Rehab Potential Good   PT Frequency 2x / week   PT Duration 4 weeks   PT Treatment/Interventions ADLs/Self Care Home Management;Neuromuscular re-education;Balance training;Therapeutic exercise;Therapeutic activities;Functional mobility training;Patient/family education;Stair  training;Manual techniques;Gait training   PT Next Visit Plan balance exercises   PT Home Exercise Plan SLS, tandem stance, step ups to stair   Consulted and Agree with Plan of Care Patient      Patient will benefit from skilled therapeutic intervention in order to improve the following deficits and impairments:  Abnormal gait, Decreased balance, Decreased coordination, Decreased strength, Decreased mobility, Decreased activity tolerance, Decreased endurance  Visit Diagnosis: Gait difficulty  Unsteadiness on feet  Impairment of balance  Atrophy of calf muscles on right     Problem List Patient Active Problem List   Diagnosis Date Noted  . Essential hypertension 12/31/2014  . Hyperlipidemia 12/31/2014  . Taking multiple medications for chronic disease 12/31/2014   Pura Spice, PT, DPT # [id number] Betsy Coder, SPT 11/09/2016, 8:28 AM  [place] [facility] [address] [place], [place], [address] Phone: [phone]   Fax:  [phone]  Name: Travis Robertson MRN: [id number] Date of Birth: 1951-01-02

## 2016-11-11 ENCOUNTER — Ambulatory Visit: Payer: Medicare Other | Admitting: Physical Therapy

## 2016-11-11 ENCOUNTER — Encounter: Payer: Self-pay | Admitting: Physical Therapy

## 2016-11-11 DIAGNOSIS — R2689 Other abnormalities of gait and mobility: Secondary | ICD-10-CM | POA: Diagnosis not present

## 2016-11-11 DIAGNOSIS — M62561 Muscle wasting and atrophy, not elsewhere classified, right lower leg: Secondary | ICD-10-CM

## 2016-11-11 DIAGNOSIS — R2681 Unsteadiness on feet: Secondary | ICD-10-CM | POA: Diagnosis not present

## 2016-11-11 DIAGNOSIS — R269 Unspecified abnormalities of gait and mobility: Secondary | ICD-10-CM

## 2016-11-11 NOTE — Therapy (Signed)
[place] [facility] [address] [place], [place], [address] Phone: [phone]   Fax:  [phone]  Physical Therapy Treatment  Patient Details  Name: Travis Robertson MRN: [id number] Date of Birth: 1951/04/20 Referring Provider: Dr. Melrose Nakayama  Encounter Date: 11/11/2016      PT End of Session - 11/11/16 1437    Visit Number 5   Number of Visits 8   Date for PT Re-Evaluation 11/23/16   Authorization - Visit Number 5   Authorization - Number of Visits 10   PT Start Time 1324   PT Stop Time 1108   PT Time Calculation (min) 50 min   Equipment Utilized During Treatment Gait belt   Activity Tolerance Patient tolerated treatment well   Behavior During Therapy [facility] for tasks assessed/performed      Past Medical History:  Diagnosis Date  . Hyperlipidemia   . Hypertension     Past Surgical History:  Procedure Laterality Date  . APPENDECTOMY    . CATARACT EXTRACTION Bilateral   . COLONOSCOPY  2015   cleared for 5 yrs- K C Docs  . RETINAL DETACHMENT SURGERY Bilateral     There were no vitals filed for this visit.      Subjective Assessment - 11/11/16 1423    Subjective Pt. presents to PT with no pain and denies recent falls. Pt. reports he felt really well after the previous tx session but it only lasted for that day and he felt back to his "new normal" after Tuesday. Pt. stated his neurologist posted his MRI results on mychart and everything was normal.     Limitations Walking   Patient Stated Goals Increase balance/ gait endurance.     Currently in Pain? No/denies   Multiple Pain Sites No      Neuro Re-ed:  SciFit elliptical L6 x61min (not billed) Single leg calf raises x25 each LE Squats 15x2 Rebounder x20. Pt required verbal cueing for correct positioning Walking in hallway while finding letters/numbers x5 laps Marching in place with opp UE in // bars Marching with opp UE in hallway Agility ladder step over step every  rung x10 lengths Agility ladder step over step skipping rung x10 lengths. Pt required verbal cueing to maintain correct position         PT Education - 11/11/16 1428    Education provided Yes   Education Details see HEP   Person(s) Educated Patient   Methods Explanation   Comprehension Verbalized understanding             PT Long Term Goals - 10/26/16 1141      PT LONG TERM GOAL #1   Title Pt. will increase LEFS to >70 out of 80 to improve functional mobility.     Baseline LEFS on 7/3 is 58/80.     Time 4   Period Weeks   Status New     PT LONG TERM GOAL #2   Title Patient will increase his 6MWT to 1875 ft in order to increase functional mobility and ability to participate in community activities   Baseline 1595 feet on 7/3   Time 4   Period Weeks   Status New     PT LONG TERM GOAL #3   Title Patient independent with HEP to decrease his 5 times sit to stand time to 10s in order to decrease his fall risk   Baseline 12.93s   Time 4   Period Weeks   Status New  PT LONG TERM GOAL #4   Title Pt. will ambulate with more normalized gait pattern/ increase R arm swing with consistent heel strike to improve mobility/ decrease fall risk.     Baseline No R arm swing.  Self-reported walking issues on uneven terrain.     Time 4   Period Weeks   Status New            Plan - 11/11/16 1438    Clinical Impression Statement Pt. demonstrates improved performance during dynamic standing balance activities but still shows difficulty with coordination tasks. Pt required verbal cueing during opposite arm and opposite leg marching to maintain correct form. Pt demonstrated decreased ability to correctly perform agility ladder stepping, and required verbal cueing to perform in the correct manner. Pt. demonstrated difficulty with rebound tossing and catching, and was unable to perform single leg rebound as desired. Pt. is hopeful his new HEP will increase his ability to perform  coordination tasks.   Clinical Presentation Stable   Clinical Decision Making Moderate   Rehab Potential Good   PT Frequency 2x / week   PT Duration 4 weeks   PT Treatment/Interventions ADLs/Self Care Home Management;Neuromuscular re-education;Balance training;Therapeutic exercise;Therapeutic activities;Functional mobility training;Patient/family education;Stair training;Manual techniques;Gait training   PT Next Visit Plan balance exercises   PT Home Exercise Plan SLS, tandem stance, step ups to stair   Consulted and Agree with Plan of Care Patient      Patient will benefit from skilled therapeutic intervention in order to improve the following deficits and impairments:  Abnormal gait, Decreased balance, Decreased coordination, Decreased strength, Decreased mobility, Decreased activity tolerance, Decreased endurance  Visit Diagnosis: Gait difficulty  Unsteadiness on feet  Impairment of balance  Atrophy of calf muscles on right     Problem List Patient Active Problem List   Diagnosis Date Noted  . Essential hypertension 12/31/2014  . Hyperlipidemia 12/31/2014  . Taking multiple medications for chronic disease 12/31/2014   Pura Spice, PT, DPT # [place], SPT 11/11/2016, 3:37 PM  [facility] [address] [place], [place], [address] Phone: [phone]   Fax:  [phone]  Name: Travis Robertson MRN: [id number] Date of Birth: 1951-02-20

## 2016-11-15 ENCOUNTER — Encounter: Payer: Self-pay | Admitting: Physical Therapy

## 2016-11-15 ENCOUNTER — Ambulatory Visit: Payer: Medicare Other | Admitting: Physical Therapy

## 2016-11-15 DIAGNOSIS — M62561 Muscle wasting and atrophy, not elsewhere classified, right lower leg: Secondary | ICD-10-CM

## 2016-11-15 DIAGNOSIS — R2689 Other abnormalities of gait and mobility: Secondary | ICD-10-CM | POA: Diagnosis not present

## 2016-11-15 DIAGNOSIS — R269 Unspecified abnormalities of gait and mobility: Secondary | ICD-10-CM | POA: Diagnosis not present

## 2016-11-15 DIAGNOSIS — R2681 Unsteadiness on feet: Secondary | ICD-10-CM

## 2016-11-15 NOTE — Therapy (Signed)
[place] [address] [place], [place], [address] Phone: [phone]   Fax:  [phone]  Physical Therapy Treatment  Patient Details  Name: Travis Robertson MRN: [id number] Date of Birth: January 11, 1951 Referring Provider: Dr. Melrose Nakayama  Encounter Date: 11/15/2016      PT End of Session - 11/15/16 1201    Visit Number 6   Number of Visits 8   Date for PT Re-Evaluation 11/23/16   Authorization - Visit Number 6   Authorization - Number of Visits 10   PT Start Time 1021   PT Stop Time 1054   PT Time Calculation (min) 33 min   Equipment Utilized During Treatment Gait belt   Activity Tolerance Patient tolerated treatment well   Behavior During Therapy [facility] for tasks assessed/performed      Past Medical History:  Diagnosis Date  . Hyperlipidemia   . Hypertension     Past Surgical History:  Procedure Laterality Date  . APPENDECTOMY    . CATARACT EXTRACTION Bilateral   . COLONOSCOPY  2015   cleared for 5 yrs- K C Docs  . RETINAL DETACHMENT SURGERY Bilateral     There were no vitals filed for this visit.      Subjective Assessment - 11/15/16 1200    Subjective Pt. presents to PT with no pain and denies falls. Pt. states he had stressful phone calls to make this morning and he noticed his symptoms were worse leading up to and during those calls. Pt. reports his anxiety and stress levels influence his symptoms.    Limitations Walking   Patient Stated Goals Increase balance/ gait endurance.     Currently in Pain? No/denies   Multiple Pain Sites No      Neuro Re-ed Elliptical x48min Rebounder feet apart x20 throws Rebounder feet together x20 throws Rebounder tandem stance with RLE in front, LLE in front x10 each Agility ladder step over step: fwd, sideways, and backwards x5 lengths each Agility ladder step over step skipping a rung: fwd, sideways x5 lengths each Stair climbing with emphasis on reciprocal pattern. Pt  demonstrated increased difficulty with descent and paused before first step Dynamic standing balance star touching x5 BLE Single leg calf raises x15 BLE       PT Education - 11/15/16 1201    Education provided Yes   Education Details reviewed HEP   Person(s) Educated Patient   Methods Explanation   Comprehension Verbalized understanding;Returned demonstration             PT Long Term Goals - 10/26/16 1141      PT LONG TERM GOAL #1   Title Pt. will increase LEFS to >70 out of 80 to improve functional mobility.     Baseline LEFS on 7/3 is 58/80.     Time 4   Period Weeks   Status New     PT LONG TERM GOAL #2   Title Patient will increase his 6MWT to 1875 ft in order to increase functional mobility and ability to participate in community activities   Baseline 1595 feet on 7/3   Time 4   Period Weeks   Status New     PT LONG TERM GOAL #3   Title Patient independent with HEP to decrease his 5 times sit to stand time to 10s in order to decrease his fall risk   Baseline 12.93s   Time 4   Period Weeks   Status New     PT  LONG TERM GOAL #4   Title Pt. will ambulate with more normalized gait pattern/ increase R arm swing with consistent heel strike to improve mobility/ decrease fall risk.     Baseline No R arm swing.  Self-reported walking issues on uneven terrain.     Time 4   Period Weeks   Status New               Plan - 11/15/16 1203    Clinical Impression Statement Pt. demonstrated improved static and dynamic standing balance activities and required fewer rest breaks during this tc session. Pt showed improvement in opposite arm/opposite leg marching compared to previous tx session. Pt. required verbal cueing to maintain correct position during agility ladder exercises, and also required verbal cueing for foot placement during dynamic balance rebounding.   Clinical Presentation Stable   Clinical Decision Making Moderate   Rehab Potential Good   PT Frequency  2x / week   PT Duration 4 weeks   PT Treatment/Interventions ADLs/Self Care Home Management;Neuromuscular re-education;Balance training;Therapeutic exercise;Therapeutic activities;Functional mobility training;Patient/family education;Stair training;Manual techniques;Gait training   PT Next Visit Plan balance exercises.  CHECK GOALS/ DISCUSS DISCHARGE vs. RECERT   PT Home Exercise Plan SLS, tandem stance, step ups to stair   Consulted and Agree with Plan of Care Patient      Patient will benefit from skilled therapeutic intervention in order to improve the following deficits and impairments:  Abnormal gait, Decreased balance, Decreased coordination, Decreased strength, Decreased mobility, Decreased activity tolerance, Decreased endurance  Visit Diagnosis: Gait difficulty  Unsteadiness on feet  Impairment of balance  Atrophy of calf muscles on right     Problem List Patient Active Problem List   Diagnosis Date Noted  . Essential hypertension 12/31/2014  . Hyperlipidemia 12/31/2014  . Taking multiple medications for chronic disease 12/31/2014   Pura Spice, PT, DPT # [id number] Betsy Coder, SPT 11/15/2016, 3:33 PM  [facility] [address] [place], [place], [address] Phone: [phone]   Fax:  [phone]  Name: Travis Robertson MRN: [id number] Date of Birth: 03-22-1951

## 2016-11-18 ENCOUNTER — Other Ambulatory Visit: Payer: Self-pay

## 2016-11-18 ENCOUNTER — Ambulatory Visit: Payer: Medicare Other | Admitting: Physical Therapy

## 2016-11-18 ENCOUNTER — Encounter: Payer: Self-pay | Admitting: Physical Therapy

## 2016-11-18 DIAGNOSIS — R2681 Unsteadiness on feet: Secondary | ICD-10-CM

## 2016-11-18 DIAGNOSIS — M62561 Muscle wasting and atrophy, not elsewhere classified, right lower leg: Secondary | ICD-10-CM | POA: Diagnosis not present

## 2016-11-18 DIAGNOSIS — R2689 Other abnormalities of gait and mobility: Secondary | ICD-10-CM

## 2016-11-18 DIAGNOSIS — R269 Unspecified abnormalities of gait and mobility: Secondary | ICD-10-CM | POA: Diagnosis not present

## 2016-11-18 MED ORDER — LOSARTAN POTASSIUM-HCTZ 50-12.5 MG PO TABS: 1.0000 | ORAL_TABLET | Freq: Every day | ORAL | 0 refills | Status: DC

## 2016-11-18 NOTE — Therapy (Signed)
[facility] [address] [place], [place], [address] Phone: [phone]   Fax:  [phone]  Physical Therapy Treatment  Patient Details  Name: Travis Robertson MRN: [id number] Date of Birth: 06/13/1950 Referring Provider: Dr. Melrose Nakayama  Encounter Date: 11/18/2016      PT End of Session - 11/18/16 1205    Visit Number 7   Number of Visits 8   Date for PT Re-Evaluation 11/23/16   Authorization - Visit Number 7   Authorization - Number of Visits 10   PT Start Time 1019   PT Stop Time 1046   PT Time Calculation (min) 27 min   Equipment Utilized During Treatment Gait belt   Activity Tolerance Patient tolerated treatment well   Behavior During Therapy [facility] for tasks assessed/performed      Past Medical History:  Diagnosis Date  . Hyperlipidemia   . Hypertension     Past Surgical History:  Procedure Laterality Date  . APPENDECTOMY    . CATARACT EXTRACTION Bilateral   . COLONOSCOPY  2015   cleared for 5 yrs- [facility]  . RETINAL DETACHMENT SURGERY Bilateral     There were no vitals filed for this visit.      Subjective Assessment - 11/18/16 1203    Subjective Pt denies pain and recent falls. Pt states his tandem walking, marching, and morning walking program are going well and have improved since beginning PT. Pt is in agreement with not scheduling another PT appt at this time, and will report back in two weeks to update PT.   Limitations Walking   Patient Stated Goals Increase balance/ gait endurance.     Currently in Pain? No/denies   Multiple Pain Sites No      Neuro Re-ed:  SciFit elliptical x86min L6 (no charge/warm up) Stair climbing x10. Pt demonstrates improvement in both ascending and descending stairs with no UE support Step over step skipping rung on agility ladder x5 laps Resisted walking fwd/bwd/side/side x7 each Tandem walking in // bars x6 lengths Marching with opp UE touching LE in // bars x6  lengths      PT Education - 11/18/16 1204    Education provided Yes   Education Details plan of care, home program   Person(s) Educated Patient   Methods Explanation   Comprehension Verbalized understanding;Returned demonstration             PT Long Term Goals - 11/19/16 1250      PT LONG TERM GOAL #1   Title Pt. will increase LEFS to >70 out of 80 to improve functional mobility.     Baseline LEFS on 7/3 is 58/80.     Time 4   Period Weeks   Status On-going     PT LONG TERM GOAL #2   Title Patient will increase his 6MWT to 1875 ft in order to increase functional mobility and ability to participate in community activities   Baseline 1595 feet on 7/3   Time 4   Period Weeks   Status On-going     PT LONG TERM GOAL #3   Title Patient independent with HEP to decrease his 5 times sit to stand time to 10s in order to decrease his fall risk   Baseline 9.98 sec.     Time 4   Period Weeks   Status Achieved     PT LONG TERM GOAL #4   Title Pt. will ambulate with more normalized gait pattern/ increase  R arm swing with consistent heel strike to improve mobility/ decrease fall risk.     Baseline marked improvement in gait pattern.     Time 4   Period Weeks   Status Achieved               Plan - 2016-12-15 1207    Clinical Impression Statement Pt demonstrates increased ability to tolerate dynamic standing balance activities and has show marked improvement in tandem walking, marching with opp UE touching LE, as well as stair climbing. Pt reports increase in function and will be discharged to home program.   Clinical Presentation Stable   Clinical Decision Making Moderate   Rehab Potential Good   PT Frequency 2x / week   PT Duration 4 weeks   PT Treatment/Interventions ADLs/Self Care Home Management;Neuromuscular re-education;Balance training;Therapeutic exercise;Therapeutic activities;Functional mobility training;Patient/family education;Stair training;Manual  techniques;Gait training   PT Next Visit Plan Discharge   PT Home Exercise Plan SLS, tandem stance, step ups to stair   Consulted and Agree with Plan of Care Patient      Patient will benefit from skilled therapeutic intervention in order to improve the following deficits and impairments:  Abnormal gait, Decreased balance, Decreased coordination, Decreased strength, Decreased mobility, Decreased activity tolerance, Decreased endurance  Visit Diagnosis: Gait difficulty  Unsteadiness on feet  Impairment of balance  Atrophy of calf muscles on right       G-Codes - 2016-12-15 1251    Functional Assessment Tool Used (Outpatient Only) Clinical impression/ LEFS/ Berg/ gait difficulty   Functional Limitation Mobility: Walking and moving around   Mobility: Walking and Moving Around Current Status (Q2229) 0 percent impaired, limited or restricted   Mobility: Walking and Moving Around Goal Status (N9892) 0 percent impaired, limited or restricted   Mobility: Walking and Moving Around Discharge Status [id number]) 0 percent impaired, limited or restricted      Problem List Patient Active Problem List   Diagnosis Date Noted  . Essential hypertension 12/31/2014  . Hyperlipidemia 12/31/2014  . Taking multiple medications for chronic disease 12/31/2014   Pura Spice, PT, DPT # [id number] Betsy Coder, SPT 11/19/2016, 12:52 PM  [place] [facility] [address] [place], [place], [address] Phone: [phone]   Fax:  [phone]  Name: Travis Robertson MRN: [id number] Date of Birth: 06-25-1950

## 2016-12-23 DIAGNOSIS — R413 Other amnesia: Secondary | ICD-10-CM | POA: Insufficient documentation

## 2016-12-23 DIAGNOSIS — R270 Ataxia, unspecified: Secondary | ICD-10-CM | POA: Diagnosis not present

## 2016-12-23 DIAGNOSIS — E538 Deficiency of other specified B group vitamins: Secondary | ICD-10-CM | POA: Diagnosis not present

## 2016-12-23 DIAGNOSIS — R42 Dizziness and giddiness: Secondary | ICD-10-CM | POA: Diagnosis not present

## 2017-01-18 ENCOUNTER — Encounter: Payer: Self-pay | Admitting: Family Medicine

## 2017-01-18 ENCOUNTER — Ambulatory Visit (INDEPENDENT_AMBULATORY_CARE_PROVIDER_SITE_OTHER): Payer: Medicare Other | Admitting: Family Medicine

## 2017-01-18 VITALS — BP 142/80 | HR 64 | Ht 68.0 in | Wt 164.0 lb

## 2017-01-18 DIAGNOSIS — F330 Major depressive disorder, recurrent, mild: Secondary | ICD-10-CM | POA: Diagnosis not present

## 2017-01-18 DIAGNOSIS — Z23 Encounter for immunization: Secondary | ICD-10-CM | POA: Diagnosis not present

## 2017-01-18 DIAGNOSIS — F419 Anxiety disorder, unspecified: Secondary | ICD-10-CM | POA: Diagnosis not present

## 2017-01-18 MED ORDER — SERTRALINE HCL 50 MG PO TABS: 50.0000 mg | ORAL_TABLET | Freq: Every day | ORAL | 3 refills | Status: DC

## 2017-01-18 MED ORDER — ALPRAZOLAM 0.25 MG PO TABS: 0.2500 mg | ORAL_TABLET | Freq: Two times a day (BID) | ORAL | 1 refills | Status: DC | PRN

## 2017-01-18 NOTE — Progress Notes (Signed)
Name: Travis Robertson   MRN: [id number]    DOB: 12/16/1950   Date:01/18/2017       Progress Note  Subjective  Chief Complaint  Chief Complaint  Patient presents with  . Depression    is waiting to go to [facility] for further eval for second opinion on neurological issues- Oct 29th. Worried about possible parkinson's disease- needs something in the meantime while waiting on appt    Depression         This is a new problem.  The current episode started more than 1 month ago.   The onset quality is gradual.   The problem has been gradually worsening since onset.  Associated symptoms include decreased concentration, insomnia, irritable and sad.  Associated symptoms include no helplessness, no hopelessness, no decreased interest, no appetite change, no myalgias, no headaches and no suicidal ideas.     The symptoms are aggravated by medication.  Past treatments include nothing.  Previous treatment provided mild relief.   No problem-specific Assessment & Plan notes found for this encounter.   Past Medical History:  Diagnosis Date  . Hyperlipidemia   . Hypertension     Past Surgical History:  Procedure Laterality Date  . APPENDECTOMY    . CATARACT EXTRACTION Bilateral   . COLONOSCOPY  2015   cleared for 5 yrs- [facility]  . RETINAL DETACHMENT SURGERY Bilateral     No family history on file.  Social History   Social History  . Marital status: Married    Spouse name: N/A  . Number of children: N/A  . Years of education: N/A   Occupational History  . Not on file.   Social History Main Topics  . Smoking status: Former Research scientist (life sciences)  . Smokeless tobacco: Never Used  . Alcohol use 0.0 oz/week  . Drug use: No  . Sexual activity: No   Other Topics Concern  . Not on file   Social History Narrative  . No narrative on file    No Known Allergies  Outpatient Medications Prior to Visit  Medication Sig Dispense Refill  . aspirin 81 MG tablet Take 81 mg by mouth daily.    Marland Kitchen  atorvastatin (LIPITOR) 10 MG tablet Take 1 tablet (10 mg total) by mouth daily. 90 tablet 1  . losartan-hydrochlorothiazide (HYZAAR) 50-12.5 MG tablet Take 1 tablet by mouth daily. 90 tablet 0  . metoprolol succinate (TOPROL-XL) 100 MG 24 hr tablet Take 1 tablet (100 mg total) by mouth daily. Take with or immediately following a meal. 90 tablet 1  . carbamide peroxide (DEBROX) 6.5 % otic solution Place 5 drops into both ears 2 (two) times daily. 15 mL 0   No facility-administered medications prior to visit.     Review of Systems  Constitutional: Negative for appetite change, chills, fever, malaise/fatigue and weight loss.  HENT: Negative for ear discharge, ear pain and sore throat.   Eyes: Negative for blurred vision.  Respiratory: Negative for cough, sputum production, shortness of breath and wheezing.   Cardiovascular: Negative for chest pain, palpitations and leg swelling.  Gastrointestinal: Negative for abdominal pain, blood in stool, constipation, diarrhea, heartburn, melena and nausea.  Genitourinary: Negative for dysuria, frequency, hematuria and urgency.  Musculoskeletal: Negative for back pain, joint pain, myalgias and neck pain.  Skin: Negative for rash.  Neurological: Negative for dizziness, tingling, sensory change, focal weakness and headaches.  Endo/Heme/Allergies: Negative for environmental allergies and polydipsia. Does not bruise/bleed easily.  Psychiatric/Behavioral: Positive for decreased concentration and  depression. Negative for suicidal ideas. The patient has insomnia. The patient is not nervous/anxious.      Objective  Vitals:   01/18/17 1527  BP: (!) 142/80  Pulse: 64  Weight: 164 lb (74.4 kg)  Height: 5\' 8"  (1.727 m)    Physical Exam  Constitutional: He is oriented to person, place, and time and well-developed, well-nourished, and in no distress. He is irritable.  HENT:  Head: Normocephalic.  Right Ear: External ear normal.  Left Ear: External ear  normal.  Nose: Nose normal.  Mouth/Throat: Oropharynx is clear and moist.  Eyes: Pupils are equal, round, and reactive to light. Conjunctivae and EOM are normal. Right eye exhibits no discharge. Left eye exhibits no discharge. No scleral icterus.  Neck: Normal range of motion. Neck supple. No JVD present. No tracheal deviation present. No thyromegaly present.  Cardiovascular: Normal rate, regular rhythm, normal heart sounds and intact distal pulses.  Exam reveals no gallop and no friction rub.   No murmur heard. Pulmonary/Chest: Breath sounds normal. No respiratory distress. He has no wheezes. He has no rales.  Abdominal: Soft. Bowel sounds are normal. He exhibits no mass. There is no hepatosplenomegaly. There is no tenderness. There is no rebound, no guarding and no CVA tenderness.  Musculoskeletal: Normal range of motion. He exhibits no edema or tenderness.  Lymphadenopathy:    He has no cervical adenopathy.  Neurological: He is alert and oriented to person, place, and time. He has normal sensation, normal strength, normal reflexes and intact cranial nerves. No cranial nerve deficit.  Skin: Skin is warm. No rash noted.  Psychiatric: Mood and affect normal.  Nursing note and vitals reviewed.     Assessment & Plan  Problem List Items Addressed This Visit    None    Visit Diagnoses    Mild episode of recurrent major depressive disorder ([place])    -  Primary   Relevant Medications   sertraline (ZOLOFT) 50 MG tablet   ALPRAZolam (XANAX) 0.25 MG tablet   Anxiety       Relevant Medications   sertraline (ZOLOFT) 50 MG tablet   ALPRAZolam (XANAX) 0.25 MG tablet   Influenza vaccine needed       Relevant Orders   Flu vaccine HIGH DOSE PF (Completed)      Meds ordered this encounter  Medications  . sertraline (ZOLOFT) 50 MG tablet    Sig: Take 1 tablet (50 mg total) by mouth daily.    Dispense:  30 tablet    Refill:  3    One half tablet q day for 8 days then 1 a day  . ALPRAZolam  (XANAX) 0.25 MG tablet    Sig: Take 1 tablet (0.25 mg total) by mouth 2 (two) times daily as needed for anxiety.    Dispense:  40 tablet    Refill:  1  I spent 30 minutes with this patient, More than 50% of that time was spent in face to face education, counseling and care coordination.    Dr. [facility]  01/18/17

## 2017-02-11 ENCOUNTER — Other Ambulatory Visit: Payer: Self-pay | Admitting: Family Medicine

## 2017-02-17 ENCOUNTER — Ambulatory Visit: Payer: Medicare Other | Admitting: Family Medicine

## 2017-02-21 DIAGNOSIS — G20 Parkinson's disease: Secondary | ICD-10-CM | POA: Diagnosis not present

## 2017-02-21 DIAGNOSIS — F419 Anxiety disorder, unspecified: Secondary | ICD-10-CM | POA: Diagnosis not present

## 2017-02-23 ENCOUNTER — Telehealth: Payer: Self-pay | Admitting: Family Medicine

## 2017-02-23 NOTE — Telephone Encounter (Signed)
Called pt to sched for AWV-I  with Nurse Health Advisor.C/b #  [phone]  Jill Alexanders

## 2017-02-24 ENCOUNTER — Ambulatory Visit: Payer: Medicare Other | Admitting: Family Medicine

## 2017-02-25 ENCOUNTER — Encounter: Payer: Self-pay | Admitting: Family Medicine

## 2017-02-25 ENCOUNTER — Ambulatory Visit (INDEPENDENT_AMBULATORY_CARE_PROVIDER_SITE_OTHER): Payer: Medicare Other | Admitting: Family Medicine

## 2017-02-25 VITALS — BP 128/80 | HR 72 | Ht 68.0 in | Wt 167.0 lb

## 2017-02-25 DIAGNOSIS — Z23 Encounter for immunization: Secondary | ICD-10-CM | POA: Diagnosis not present

## 2017-02-25 MED ORDER — SERTRALINE HCL 50 MG PO TABS: 50.0000 mg | ORAL_TABLET | Freq: Every day | ORAL | 6 refills | Status: DC

## 2017-02-25 NOTE — Progress Notes (Signed)
Name: Travis Robertson   MRN: [id number]    DOB: [birth year]   Date:02/25/2017       Progress Note  Subjective  Chief Complaint  Chief Complaint  Patient presents with  . Follow-up    saw neurologist that has referred him to parkinson's/ body movement specialist next week. was told to stop the Alprazolam due to risk for falls    Depression         This is a new problem.  The current episode started more than 1 year ago.   The onset quality is gradual.   The problem occurs daily.  The problem has been gradually improving since onset.  Associated symptoms include insomnia and irritable.  Associated symptoms include no decreased concentration, no fatigue, no helplessness, no hopelessness, no restlessness, no decreased interest, no myalgias, no headaches, no indigestion, not sad and no suicidal ideas.  Past treatments include SSRIs - Selective serotonin reuptake inhibitors.  Compliance with treatment is good.  Previous treatment provided no relief relief.  Past medical history includes anxiety.   Anxiety  Presents for follow-up visit. Symptoms include depressed mood, insomnia, nervous/anxious behavior and panic. Patient reports no chest pain, decreased concentration, dizziness, excessive worry, irritability, nausea, palpitations, restlessness, shortness of breath or suicidal ideas. Symptoms occur occasionally. The severity of symptoms is mild.      No problem-specific Assessment & Plan notes found for this encounter.   Past Medical History:  Diagnosis Date  . Hyperlipidemia   . Hypertension     Past Surgical History:  Procedure Laterality Date  . APPENDECTOMY    . CATARACT EXTRACTION Bilateral   . COLONOSCOPY  2015   cleared for 5 yrs- [facility]  . RETINAL DETACHMENT SURGERY Bilateral     No family history on file.  Social History   Social History  . Marital status: Married    Spouse name: N/A  . Number of children: N/A  . Years of education: N/A   Occupational History  .  Not on file.   Social History Main Topics  . Smoking status: Former Research scientist (life sciences)  . Smokeless tobacco: Never Used  . Alcohol use 0.0 oz/week  . Drug use: No  . Sexual activity: No   Other Topics Concern  . Not on file   Social History Narrative  . No narrative on file    No Known Allergies  Outpatient Medications Prior to Visit  Medication Sig Dispense Refill  . aspirin 81 MG tablet Take 81 mg by mouth daily.    Marland Kitchen atorvastatin (LIPITOR) 10 MG tablet Take 1 tablet (10 mg total) by mouth daily. 90 tablet 1  . losartan-hydrochlorothiazide (HYZAAR) 50-12.5 MG tablet TAKE 1 TABLET BY MOUTH EVERY DAY 90 tablet 0  . metoprolol succinate (TOPROL-XL) 100 MG 24 hr tablet Take 1 tablet (100 mg total) by mouth daily. Take with or immediately following a meal. 90 tablet 1  . ALPRAZolam (XANAX) 0.25 MG tablet Take 1 tablet (0.25 mg total) by mouth 2 (two) times daily as needed for anxiety. 40 tablet 1  . sertraline (ZOLOFT) 50 MG tablet Take 1 tablet (50 mg total) by mouth daily. 30 tablet 3   No facility-administered medications prior to visit.     Review of Systems  Constitutional: Negative for chills, fatigue, fever, irritability, malaise/fatigue and weight loss.  HENT: Negative for ear discharge, ear pain and sore throat.   Eyes: Negative for blurred vision.  Respiratory: Negative for cough, sputum production, shortness of breath and  wheezing.   Cardiovascular: Negative for chest pain, palpitations and leg swelling.  Gastrointestinal: Negative for abdominal pain, blood in stool, constipation, diarrhea, heartburn, melena and nausea.  Genitourinary: Negative for dysuria, frequency, hematuria and urgency.  Musculoskeletal: Negative for back pain, joint pain, myalgias and neck pain.  Skin: Negative for rash.  Neurological: Negative for dizziness, tingling, sensory change, focal weakness and headaches.  Endo/Heme/Allergies: Negative for environmental allergies and polydipsia. Does not  bruise/bleed easily.  Psychiatric/Behavioral: Positive for depression. Negative for decreased concentration and suicidal ideas. The patient is nervous/anxious and has insomnia.      Objective  Vitals:   02/25/17 0931  BP: 128/80  Pulse: 72  Weight: 167 lb (75.8 kg)  Height: 5\' 8"  (1.727 m)    Physical Exam  Constitutional: He is oriented to person, place, and time and well-developed, well-nourished, and in no distress. He is irritable.  HENT:  Head: Normocephalic.  Right Ear: External ear normal.  Left Ear: External ear normal.  Nose: Nose normal.  Mouth/Throat: Oropharynx is clear and moist.  Eyes: Pupils are equal, round, and reactive to light. Conjunctivae and EOM are normal. Right eye exhibits no discharge. Left eye exhibits no discharge. No scleral icterus.  Neck: Normal range of motion. Neck supple. No JVD present. No tracheal deviation present. No thyromegaly present.  Cardiovascular: Normal rate, regular rhythm, normal heart sounds and intact distal pulses.  Exam reveals no gallop and no friction rub.   No murmur heard. Pulmonary/Chest: Breath sounds normal. No respiratory distress. He has no wheezes. He has no rales.  Abdominal: Soft. Bowel sounds are normal. He exhibits no mass. There is no hepatosplenomegaly. There is no tenderness. There is no rebound, no guarding and no CVA tenderness.  Musculoskeletal: Normal range of motion. He exhibits no edema or tenderness.  Lymphadenopathy:    He has no cervical adenopathy.  Neurological: He is alert and oriented to person, place, and time. He has normal sensation, normal strength, normal reflexes and intact cranial nerves. No cranial nerve deficit.  Skin: Skin is warm. No rash noted.  Psychiatric: Mood and affect normal.  Nursing note and vitals reviewed.     Assessment & Plan  Problem List Items Addressed This Visit    None    Visit Diagnoses    Need for 23-polyvalent pneumococcal polysaccharide vaccine    -  Primary    Relevant Medications   sertraline (ZOLOFT) 50 MG tablet   Other Relevant Orders   Pneumococcal polysaccharide vaccine 23-valent greater than or equal to 2yo subcutaneous/IM (Completed)      Meds ordered this encounter  Medications  . sertraline (ZOLOFT) 50 MG tablet    Sig: Take 1 tablet (50 mg total) by mouth daily. May increase to one and a half q day    Dispense:  45 tablet    Refill:  6      Dr. Otilio Miu [facility]  02/25/17

## 2017-03-02 DIAGNOSIS — Z87891 Personal history of nicotine dependence: Secondary | ICD-10-CM | POA: Diagnosis not present

## 2017-03-02 DIAGNOSIS — R260 Ataxic gait: Secondary | ICD-10-CM | POA: Diagnosis not present

## 2017-03-02 DIAGNOSIS — Z79899 Other long term (current) drug therapy: Secondary | ICD-10-CM | POA: Diagnosis not present

## 2017-03-02 DIAGNOSIS — G20 Parkinson's disease: Secondary | ICD-10-CM | POA: Diagnosis not present

## 2017-03-09 DIAGNOSIS — G20 Parkinson's disease: Secondary | ICD-10-CM | POA: Diagnosis not present

## 2017-03-09 DIAGNOSIS — R482 Apraxia: Secondary | ICD-10-CM | POA: Diagnosis not present

## 2017-03-09 DIAGNOSIS — G249 Dystonia, unspecified: Secondary | ICD-10-CM | POA: Diagnosis not present

## 2017-03-28 ENCOUNTER — Other Ambulatory Visit: Payer: Self-pay

## 2017-03-28 ENCOUNTER — Ambulatory Visit: Payer: Medicare Other

## 2017-03-28 ENCOUNTER — Ambulatory Visit
Admission: EM | Admit: 2017-03-28 | Discharge: 2017-03-28 | Disposition: A | Discharge status: Home / Self Care | Payer: Medicare Other | Attending: Family Medicine | Admitting: Family Medicine

## 2017-03-28 DIAGNOSIS — S8265XA Nondisplaced fracture of lateral malleolus of left fibula, initial encounter for closed fracture: Secondary | ICD-10-CM | POA: Diagnosis not present

## 2017-03-28 DIAGNOSIS — Z961 Presence of intraocular lens: Secondary | ICD-10-CM | POA: Diagnosis not present

## 2017-03-28 DIAGNOSIS — M25472 Effusion, left ankle: Secondary | ICD-10-CM | POA: Insufficient documentation

## 2017-03-28 DIAGNOSIS — M25572 Pain in left ankle and joints of left foot: Secondary | ICD-10-CM | POA: Diagnosis present

## 2017-03-28 DIAGNOSIS — S93402A Sprain of unspecified ligament of left ankle, initial encounter: Secondary | ICD-10-CM

## 2017-03-28 DIAGNOSIS — S8262XA Displaced fracture of lateral malleolus of left fibula, initial encounter for closed fracture: Secondary | ICD-10-CM | POA: Diagnosis not present

## 2017-03-28 DIAGNOSIS — X58XXXA Exposure to other specified factors, initial encounter: Secondary | ICD-10-CM | POA: Insufficient documentation

## 2017-03-28 DIAGNOSIS — H338 Other retinal detachments: Secondary | ICD-10-CM | POA: Diagnosis not present

## 2017-03-28 MED ORDER — HYDROCODONE-ACETAMINOPHEN 5-325 MG PO TABS: ORAL_TABLET | ORAL | 0 refills | Status: DC

## 2017-03-28 NOTE — ED Triage Notes (Signed)
Patient reports that he fell down 10-12 steps today around 330pm. Patient states that he did hit his head, left ankle and right shoulder. Patient denies loss of conciousness. Patient states that after the fall he went walking for 1.5 miles. Patient reports that he started noticing pain and being unable to bear weight on ankle around 530pm today. Patient states that ankle has been swelling and painful quickly.

## 2017-03-28 NOTE — ED Provider Notes (Signed)
[facility]    CSN: [id number] Arrival date & time: 03/28/17  1921     History   Chief Complaint Chief Complaint  Patient presents with  . Fall  . Ankle Pain    left    HPI Travis Robertson is a 66 y.o. male.   66 yo male with a c/o increasing left ankle swelling and pain since falling earlier today. States he fell down some steps and may have twisted his ankle. He has been able to bear weight since this happened today but pain and swelling have increased.   The history is provided by the patient.    Past Medical History:  Diagnosis Date  . Hyperlipidemia   . Hypertension     Patient Active Problem List   Diagnosis Date Noted  . Essential hypertension 12/31/2014  . Hyperlipidemia 12/31/2014  . Taking multiple medications for chronic disease 12/31/2014    Past Surgical History:  Procedure Laterality Date  . APPENDECTOMY    . CATARACT EXTRACTION Bilateral   . COLONOSCOPY  2015   cleared for 5 yrs- [facility]  . RETINAL DETACHMENT SURGERY Bilateral        Home Medications    Prior to Admission medications   Medication Sig Start Date End Date Taking? Authorizing Provider  aspirin 81 MG tablet Take 81 mg by mouth daily.   Yes [provider]  atorvastatin (LIPITOR) 10 MG tablet Take 1 tablet (10 mg total) by mouth daily. 09/01/16  Yes Juline Patch, MD  losartan-hydrochlorothiazide (HYZAAR) 50-12.5 MG tablet TAKE 1 TABLET BY MOUTH EVERY DAY 02/11/17  Yes Juline Patch, MD  metoprolol succinate (TOPROL-XL) 100 MG 24 hr tablet Take 1 tablet (100 mg total) by mouth daily. Take with or immediately following a meal. 09/01/16  Yes Juline Patch, MD  sertraline (ZOLOFT) 50 MG tablet Take 1 tablet (50 mg total) by mouth daily. May increase to one and a half q day 02/25/17  Yes Juline Patch, MD  HYDROcodone-acetaminophen (NORCO/VICODIN) 5-325 MG tablet 1-2 tabs po q 8 hours prn 03/28/17   Norval Gable, MD    Family History Family History    Problem Relation Age of Onset  . Dementia Mother   . Stroke Father     Social History Social History   Tobacco Use  . Smoking status: Former Research scientist (life sciences)  . Smokeless tobacco: Never Used  Substance Use Topics  . Alcohol use: Yes    Alcohol/week: 0.0 oz    Comment: occasionally  . Drug use: No     Allergies   Patient has no known allergies.   Review of Systems Review of Systems   Physical Exam Triage Vital Signs ED Triage Vitals  Enc Vitals Group     BP 03/28/17 1952 (!) 174/79     Pulse Rate 03/28/17 1952 72     Resp 03/28/17 1952 18     Temp 03/28/17 1952 98.7 F (37.1 C)     Temp Source 03/28/17 1952 Oral     SpO2 03/28/17 1952 96 %     Weight 03/28/17 1954 164 lb (74.4 kg)     Height 03/28/17 1954 5\' 8"  (1.727 m)     Head Circumference --      Peak Flow --      Pain Score 03/28/17 1954 8     Pain Loc --      Pain Edu? --      Excl. in [place]? --  No data found.  Updated Vital Signs BP (!) 174/79 (BP Location: Left Arm)   Pulse 72   Temp 98.7 F (37.1 C) (Oral)   Resp 18   Ht 5\' 8"  (1.727 m)   Wt 164 lb (74.4 kg)   SpO2 96%   BMI 24.94 kg/m   Visual Acuity Right Eye Distance:   Left Eye Distance:   Bilateral Distance:    Right Eye Near:   Left Eye Near:    Bilateral Near:     Physical Exam  Constitutional: He appears well-developed and well-nourished. No distress.  Musculoskeletal:       Left ankle: He exhibits swelling. He exhibits normal range of motion, no ecchymosis, no deformity, no laceration and normal pulse. Tenderness. Lateral malleolus and AITFL tenderness found. No medial malleolus, no CF ligament, no posterior TFL, no head of 5th metatarsal and no proximal fibula tenderness found. Achilles tendon normal.  Skin: He is not diaphoretic.  Nursing note and vitals reviewed.    UC Treatments / Results  Labs (all labs ordered are listed, but only abnormal results are displayed) Labs Reviewed - No data to display  EKG  EKG  Interpretation None       Radiology Dg Ankle Complete Left  Result Date: 03/28/2017 CLINICAL DATA:  Pt states he injured left ankle walking down steps today when he fell. Most pain lat malleolus with swelling all around ankle. Went walking about a mile later and ankle was hurting worse EXAM: LEFT ANKLE COMPLETE - 3+ VIEW COMPARISON:  None. FINDINGS: There is a subtle lucency along the lateral aspect of the lateral malleolus most consistent with a nondisplaced fracture. There is a large ankle joint effusion. There is no evidence of arthropathy or other focal bone abnormality. Soft tissues are unremarkable. IMPRESSION: 1. Nondisplaced fracture of the lateral malleolus with severe overlying soft tissue swelling. 2. Large ankle joint effusion. Electronically Signed   By: Kathreen Devoid   On: 03/28/2017 20:27    Procedures Procedures (including critical care time)  Medications Ordered in UC Medications - No data to display   Initial Impression / Assessment and Plan / UC Course  I have reviewed the triage vital signs and the nursing notes.  Pertinent labs & imaging results that were available during my care of the patient were reviewed by me and considered in my medical decision making (see chart for details).      Final Clinical Impressions(s) / UC Diagnoses   Final diagnoses:  Nondisplaced fracture of lateral malleolus of left fibula, initial encounter for closed fracture  Sprain of left ankle, unspecified ligament, initial encounter    ED Discharge Orders        Ordered    HYDROcodone-acetaminophen (NORCO/VICODIN) 5-325 MG tablet     03/28/17 2035     1. x-ray results and diagnosis reviewed with patient 2. rx as per orders above; reviewed possible side effects, interactions, risks and benefits  3. Immobilization with cast boot 4. Recommend supportive treatment with otc analgesics prn 5. Follow-up with orthopedist this week   Controlled Substance Prescriptions [place] Controlled  Substance Registry consulted? Not Applicable   Norval Gable, MD 03/28/17 2101

## 2017-03-31 DIAGNOSIS — S8265XA Nondisplaced fracture of lateral malleolus of left fibula, initial encounter for closed fracture: Secondary | ICD-10-CM | POA: Diagnosis not present

## 2017-04-02 ENCOUNTER — Other Ambulatory Visit: Payer: Self-pay | Admitting: Family Medicine

## 2017-04-02 DIAGNOSIS — I10 Essential (primary) hypertension: Secondary | ICD-10-CM

## 2017-04-02 DIAGNOSIS — E785 Hyperlipidemia, unspecified: Secondary | ICD-10-CM

## 2017-04-08 ENCOUNTER — Encounter: Payer: Self-pay | Admitting: Family Medicine

## 2017-04-08 ENCOUNTER — Ambulatory Visit
Admission: RE | Admit: 2017-04-08 | Discharge: 2017-04-08 | Disposition: A | Discharge status: Home / Self Care | Payer: Medicare Other | Source: Ambulatory Visit | Attending: Family Medicine | Admitting: Family Medicine

## 2017-04-08 ENCOUNTER — Ambulatory Visit (INDEPENDENT_AMBULATORY_CARE_PROVIDER_SITE_OTHER): Payer: Medicare Other | Admitting: Family Medicine

## 2017-04-08 VITALS — BP 110/60 | HR 60 | Ht 68.0 in | Wt 163.0 lb

## 2017-04-08 DIAGNOSIS — S4351XA Sprain of right acromioclavicular joint, initial encounter: Secondary | ICD-10-CM

## 2017-04-08 DIAGNOSIS — M25511 Pain in right shoulder: Secondary | ICD-10-CM | POA: Insufficient documentation

## 2017-04-08 MED ORDER — LOSARTAN POTASSIUM-HCTZ 50-12.5 MG PO TABS: 1.0000 | ORAL_TABLET | Freq: Every day | ORAL | 1 refills | Status: DC

## 2017-04-08 MED ORDER — TRAMADOL HCL 50 MG PO TABS: 50.0000 mg | ORAL_TABLET | Freq: Three times a day (TID) | ORAL | 0 refills | Status: DC | PRN

## 2017-04-08 NOTE — Addendum Note (Signed)
Addended by: Juline Patch on: 04/08/2017 04:43 PM   Modules accepted: Orders

## 2017-04-08 NOTE — Progress Notes (Addendum)
Name: Travis Robertson   MRN: [id number]    DOB: [birth year]   Date:04/08/2017       Progress Note  Subjective  Chief Complaint  Chief Complaint  Patient presents with  . Shoulder Pain    R) shoulder pain started 1 week after falling off bed. Goes back to ortho this Tuesday for follow up on ankle- but doesn't want to wait that long for shoulder because it is very sore. Hurts worse when reaching around to put a seatbelt on or pulling pants up. As long as he keeps it "bent" it doesn't hurt    Shoulder Pain   The pain is present in the right shoulder. This is a new problem. The current episode started in the past 7 days. There has been a history of trauma. The problem occurs constantly. The problem has been waxing and waning. The quality of the pain is described as aching. The pain is at a severity of 9/10. The pain is severe. Associated symptoms include a limited range of motion and stiffness. Pertinent negatives include no fever, inability to bear weight, itching, joint locking, joint swelling, numbness or tingling. The symptoms are aggravated by activity (pull seatbelt on). He has tried acetaminophen, NSAIDS and oral narcotics for the symptoms. The treatment provided moderate relief.  Shoulder Injury   The incident occurred at home. The right shoulder is affected. The incident occurred 5 to 7 days ago. The injury mechanism was a fall. The quality of the pain is described as aching. Pertinent negatives include no chest pain, numbness or tingling. The symptoms are aggravated by movement and overhead lifting. The treatment provided moderate relief.    No problem-specific Assessment & Plan notes found for this encounter.   Past Medical History:  Diagnosis Date  . Hyperlipidemia   . Hypertension     Past Surgical History:  Procedure Laterality Date  . APPENDECTOMY    . CATARACT EXTRACTION Bilateral   . COLONOSCOPY  2015   cleared for 5 yrs- [facility]  . RETINAL DETACHMENT SURGERY  Bilateral     Family History  Problem Relation Age of Onset  . Dementia Mother   . Stroke Father     Social History   Socioeconomic History  . Marital status: Married    Spouse name: Not on file  . Number of children: Not on file  . Years of education: Not on file  . Highest education level: Not on file  Social Needs  . Financial resource strain: Not on file  . Food insecurity - worry: Not on file  . Food insecurity - inability: Not on file  . Transportation needs - medical: Not on file  . Transportation needs - non-medical: Not on file  Occupational History  . Not on file  Tobacco Use  . Smoking status: Former Research scientist (life sciences)  . Smokeless tobacco: Never Used  Substance and Sexual Activity  . Alcohol use: Yes    Alcohol/week: 0.0 oz    Comment: occasionally  . Drug use: No  . Sexual activity: No  Other Topics Concern  . Not on file  Social History Narrative  . Not on file    No Known Allergies  Outpatient Medications Prior to Visit  Medication Sig Dispense Refill  . aspirin 81 MG tablet Take 81 mg by mouth daily.    Marland Kitchen atorvastatin (LIPITOR) 10 MG tablet TAKE 1 TABLET DAILY 90 tablet 0  . HYDROcodone-acetaminophen (NORCO/VICODIN) 5-325 MG tablet 1-2 tabs po q 8 hours prn  6 tablet 0  . metoprolol succinate (TOPROL-XL) 100 MG 24 hr tablet TAKE 1 TABLET DAILY. TAKE  WITH OR IMMEDIATELY        FOLLOWING A MEAL. 90 tablet 1  . sertraline (ZOLOFT) 50 MG tablet Take 1 tablet (50 mg total) by mouth daily. May increase to one and a half q day 45 tablet 6  . losartan-hydrochlorothiazide (HYZAAR) 50-12.5 MG tablet TAKE 1 TABLET BY MOUTH EVERY DAY 90 tablet 0   No facility-administered medications prior to visit.     Review of Systems  Constitutional: Negative for chills, fever, malaise/fatigue and weight loss.  HENT: Negative for ear discharge, ear pain and sore throat.   Eyes: Negative for blurred vision.  Respiratory: Negative for cough, sputum production, shortness of breath  and wheezing.   Cardiovascular: Negative for chest pain, palpitations and leg swelling.  Gastrointestinal: Negative for abdominal pain, blood in stool, constipation, diarrhea, heartburn, melena and nausea.  Genitourinary: Negative for dysuria, frequency, hematuria and urgency.  Musculoskeletal: Positive for falls, joint pain and stiffness. Negative for back pain, myalgias and neck pain.  Skin: Negative for itching and rash.  Neurological: Negative for dizziness, tingling, sensory change, focal weakness, numbness and headaches.  Endo/Heme/Allergies: Negative for environmental allergies and polydipsia. Does not bruise/bleed easily.  Psychiatric/Behavioral: Negative for depression and suicidal ideas. The patient is not nervous/anxious and does not have insomnia.      Objective  Vitals:   04/08/17 1539  BP: 110/60  Pulse: 60  Weight: 163 lb (73.9 kg)  Height: 5\' 8"  (1.727 m)    Physical Exam  Constitutional: He is oriented to person, place, and time and well-developed, well-nourished, and in no distress.  HENT:  Head: Normocephalic.  Right Ear: External ear normal.  Left Ear: External ear normal.  Nose: Nose normal.  Mouth/Throat: Oropharynx is clear and moist.  Eyes: Conjunctivae and EOM are normal. Pupils are equal, round, and reactive to light. Right eye exhibits no discharge. Left eye exhibits no discharge. No scleral icterus.  Neck: Normal range of motion. Neck supple. No JVD present. No tracheal deviation present. No thyromegaly present.  Cardiovascular: Normal rate, regular rhythm, normal heart sounds and intact distal pulses. Exam reveals no gallop and no friction rub.  No murmur heard. Pulmonary/Chest: Breath sounds normal. No respiratory distress. He has no wheezes. He has no rales.  Abdominal: Soft. Bowel sounds are normal. He exhibits no distension and no mass. There is no hepatosplenomegaly. There is no tenderness. There is no rebound, no guarding and no CVA tenderness.   Musculoskeletal: He exhibits no edema.       Right shoulder: He exhibits decreased range of motion, tenderness and bony tenderness.  Ecchymosis/tender right acromioclavicular liament  Lymphadenopathy:    He has no cervical adenopathy.  Neurological: He is alert and oriented to person, place, and time. He has normal sensation, normal strength, normal reflexes and intact cranial nerves. No cranial nerve deficit.  Skin: Skin is warm. No rash noted.  Psychiatric: Mood and affect normal.  Nursing note and vitals reviewed.     Assessment & Plan  Problem List Items Addressed This Visit    None    Visit Diagnoses    Sprain of right acromioclavicular ligament, initial encounter    -  Primary   Relevant Medications   traMADol (ULTRAM) 50 MG tablet   Other Relevant Orders   DG Shoulder Right (Completed)   DG Clavicle Right      Meds ordered this encounter  Medications  . losartan-hydrochlorothiazide (HYZAAR) 50-12.5 MG tablet    Sig: Take 1 tablet by mouth daily.    Dispense:  90 tablet    Refill:  1  . traMADol (ULTRAM) 50 MG tablet    Sig: Take 1 tablet (50 mg total) by mouth every 8 (eight) hours as needed.    Dispense:  30 tablet    Refill:  0      Dr. [facility]  04/08/17

## 2017-04-12 DIAGNOSIS — S8265XA Nondisplaced fracture of lateral malleolus of left fibula, initial encounter for closed fracture: Secondary | ICD-10-CM | POA: Diagnosis not present

## 2017-04-12 DIAGNOSIS — M25572 Pain in left ankle and joints of left foot: Secondary | ICD-10-CM | POA: Diagnosis not present

## 2017-04-21 DIAGNOSIS — G3185 Corticobasal degeneration: Secondary | ICD-10-CM | POA: Diagnosis not present

## 2017-04-21 DIAGNOSIS — G20 Parkinson's disease: Secondary | ICD-10-CM | POA: Insufficient documentation

## 2017-04-27 DIAGNOSIS — R41841 Cognitive communication deficit: Secondary | ICD-10-CM | POA: Diagnosis not present

## 2017-04-27 DIAGNOSIS — R471 Dysarthria and anarthria: Secondary | ICD-10-CM | POA: Diagnosis not present

## 2017-05-03 ENCOUNTER — Other Ambulatory Visit: Payer: Self-pay | Admitting: Orthopedic Surgery

## 2017-05-03 DIAGNOSIS — M25511 Pain in right shoulder: Secondary | ICD-10-CM | POA: Diagnosis not present

## 2017-05-03 DIAGNOSIS — M25572 Pain in left ankle and joints of left foot: Secondary | ICD-10-CM | POA: Diagnosis not present

## 2017-05-05 DIAGNOSIS — M25511 Pain in right shoulder: Secondary | ICD-10-CM | POA: Diagnosis not present

## 2017-05-05 DIAGNOSIS — M898X1 Other specified disorders of bone, shoulder: Secondary | ICD-10-CM | POA: Diagnosis not present

## 2017-05-05 DIAGNOSIS — S42031A Displaced fracture of lateral end of right clavicle, initial encounter for closed fracture: Secondary | ICD-10-CM | POA: Diagnosis not present

## 2017-05-10 ENCOUNTER — Ambulatory Visit
Admission: RE | Admit: 2017-05-10 | Discharge: 2017-05-10 | Disposition: A | Discharge status: Home / Self Care | Payer: Medicare Other | Source: Ambulatory Visit | Attending: Orthopedic Surgery | Admitting: Orthopedic Surgery

## 2017-05-10 DIAGNOSIS — X58XXXA Exposure to other specified factors, initial encounter: Secondary | ICD-10-CM | POA: Insufficient documentation

## 2017-05-10 DIAGNOSIS — M75101 Unspecified rotator cuff tear or rupture of right shoulder, not specified as traumatic: Secondary | ICD-10-CM | POA: Insufficient documentation

## 2017-05-10 DIAGNOSIS — M899 Disorder of bone, unspecified: Secondary | ICD-10-CM | POA: Insufficient documentation

## 2017-05-10 DIAGNOSIS — M25511 Pain in right shoulder: Secondary | ICD-10-CM

## 2017-05-10 DIAGNOSIS — S42031A Displaced fracture of lateral end of right clavicle, initial encounter for closed fracture: Secondary | ICD-10-CM | POA: Insufficient documentation

## 2017-05-12 DIAGNOSIS — M25511 Pain in right shoulder: Secondary | ICD-10-CM | POA: Diagnosis not present

## 2017-05-17 DIAGNOSIS — M89511 Osteolysis, right shoulder: Secondary | ICD-10-CM | POA: Diagnosis not present

## 2017-05-17 DIAGNOSIS — D480 Neoplasm of uncertain behavior of bone and articular cartilage: Secondary | ICD-10-CM | POA: Diagnosis not present

## 2017-05-20 DIAGNOSIS — D480 Neoplasm of uncertain behavior of bone and articular cartilage: Secondary | ICD-10-CM | POA: Insufficient documentation

## 2017-05-24 DIAGNOSIS — D480 Neoplasm of uncertain behavior of bone and articular cartilage: Secondary | ICD-10-CM | POA: Diagnosis not present

## 2017-05-24 DIAGNOSIS — Z87891 Personal history of nicotine dependence: Secondary | ICD-10-CM | POA: Diagnosis not present

## 2017-05-24 DIAGNOSIS — I719 Aortic aneurysm of unspecified site, without rupture: Secondary | ICD-10-CM | POA: Diagnosis not present

## 2017-05-24 DIAGNOSIS — Z79899 Other long term (current) drug therapy: Secondary | ICD-10-CM | POA: Diagnosis not present

## 2017-05-24 DIAGNOSIS — S4291XD Fracture of right shoulder girdle, part unspecified, subsequent encounter for fracture with routine healing: Secondary | ICD-10-CM | POA: Diagnosis not present

## 2017-05-24 DIAGNOSIS — Z7982 Long term (current) use of aspirin: Secondary | ICD-10-CM | POA: Diagnosis not present

## 2017-05-24 DIAGNOSIS — S2241XD Multiple fractures of ribs, right side, subsequent encounter for fracture with routine healing: Secondary | ICD-10-CM | POA: Diagnosis not present

## 2017-05-24 DIAGNOSIS — E041 Nontoxic single thyroid nodule: Secondary | ICD-10-CM | POA: Diagnosis not present

## 2017-05-26 DIAGNOSIS — M25511 Pain in right shoulder: Secondary | ICD-10-CM | POA: Diagnosis not present

## 2017-06-16 DIAGNOSIS — S42001D Fracture of unspecified part of right clavicle, subsequent encounter for fracture with routine healing: Secondary | ICD-10-CM | POA: Diagnosis not present

## 2017-07-04 ENCOUNTER — Ambulatory Visit (INDEPENDENT_AMBULATORY_CARE_PROVIDER_SITE_OTHER): Payer: Medicare Other

## 2017-07-04 ENCOUNTER — Other Ambulatory Visit: Payer: Self-pay

## 2017-07-04 VITALS — BP 130/64 | HR 60 | Temp 98.5°F | Resp 12 | Ht 68.0 in | Wt 167.2 lb

## 2017-07-04 DIAGNOSIS — Z1211 Encounter for screening for malignant neoplasm of colon: Secondary | ICD-10-CM

## 2017-07-04 DIAGNOSIS — Z0000 Encounter for general adult medical examination without abnormal findings: Secondary | ICD-10-CM | POA: Diagnosis not present

## 2017-07-04 NOTE — Progress Notes (Signed)
Subjective:   Travis Robertson is a 67 y.o. male who presents for an Initial Medicare Annual Wellness Visit.  Review of Systems  N/A Cardiac Risk Factors include: advanced age (>41men, >2 women);dyslipidemia;hypertension;male gender    Objective:    Today's Vitals   07/04/17 1041  BP: 130/64  Pulse: 60  Resp: 12  Temp: 98.5 F (36.9 C)  TempSrc: Oral  SpO2: 96%  Weight: 167 lb 3.2 oz (75.8 kg)  Height: 5\' 8"  (1.727 m)   Body mass index is 25.42 kg/m.  Advanced Directives 03/28/2017 12/31/2014  Does Patient Have a Medical Advance Directive? Yes No  Type of Paramedic of [place];Living will -    Current Medications (verified) Outpatient Encounter Medications as of 07/04/2017  Medication Sig  . aspirin 81 MG tablet Take 81 mg by mouth daily.  Marland Kitchen atorvastatin (LIPITOR) 10 MG tablet TAKE 1 TABLET DAILY  . carbidopa-levodopa (SINEMET) 25-100 MG tablet Take 3 tablets by mouth 3 (three) times daily.  Marland Kitchen losartan-hydrochlorothiazide (HYZAAR) 50-12.5 MG tablet Take 1 tablet by mouth daily.  . metoprolol succinate (TOPROL-XL) 100 MG 24 hr tablet TAKE 1 TABLET DAILY. TAKE  WITH OR IMMEDIATELY        FOLLOWING A MEAL.  Marland Kitchen sertraline (ZOLOFT) 50 MG tablet Take 1 tablet (50 mg total) by mouth daily. May increase to one and a half q day  . [DISCONTINUED] HYDROcodone-acetaminophen (NORCO/VICODIN) 5-325 MG tablet 1-2 tabs po q 8 hours prn  . [DISCONTINUED] traMADol (ULTRAM) 50 MG tablet Take 1 tablet (50 mg total) by mouth every 8 (eight) hours as needed.   No facility-administered encounter medications on file as of 07/04/2017.     Allergies (verified) Patient has no known allergies.   History: Past Medical History:  Diagnosis Date  . Corticobasal syndrome   . Hyperlipidemia   . Hypertension   . Impingement syndrome of shoulder    Past Surgical History:  Procedure Laterality Date  . APPENDECTOMY    . CATARACT EXTRACTION Bilateral   . COLONOSCOPY   2015   cleared for 5 yrs- [facility]  . RETINAL DETACHMENT SURGERY Bilateral    Family History  Problem Relation Age of Onset  . Dementia Mother   . Stroke Father    Social History   Socioeconomic History  . Marital status: Married    Spouse name: None  . Number of children: 2  . Years of education: None  . Highest education level: Bachelor's degree (e.g., BA, AB, BS)  Social Needs  . Financial resource strain: Not hard at all  . Food insecurity - worry: Never true  . Food insecurity - inability: Never true  . Transportation needs - medical: No  . Transportation needs - non-medical: No  Occupational History  . Occupation: Retired  Tobacco Use  . Smoking status: Former Smoker    Packs/day: 1.50    Years: 40.00    Pack years: 60.00    Types: Cigarettes    Last attempt to quit: 07/2015    Years since quitting: 1.9  . Smokeless tobacco: Never Used  . Tobacco comment: smoking cessation materials not required  Substance and Sexual Activity  . Alcohol use: Yes    Alcohol/week: 8.4 oz    Types: 14 Cans of beer per week  . Drug use: No  . Sexual activity: No  Other Topics Concern  . None  Social History Narrative  . None   Tobacco Counseling Counseling given: No Comment: smoking  cessation materials not required   Clinical Intake:  Pre-visit preparation completed: Yes  Pain : No/denies pain     BMI - recorded: 25.42 Nutritional Status: BMI 25 -29 Overweight Nutritional Risks: None Diabetes: No  How often do you need to have someone help you when you read instructions, pamphlets, or other written materials from your doctor or pharmacy?: 1 - Never  Interpreter Needed?: No  Information entered by :: AEversole, LPN  Activities of Daily Living In your present state of health, do you have any difficulty performing the following activities: 07/04/2017  Hearing? N  Comment denies hearing aids  Vision? Y  Comment wears eyeglasses; blind in R eye  Difficulty  concentrating or making decisions? N  Walking or climbing stairs? Y  Comment Corticobasal syndrome  Dressing or bathing? N  Doing errands, shopping? N  Preparing Food and eating ? N  Comment denies dentures  Using the Toilet? N  In the past six months, have you accidently leaked urine? N  Do you have problems with loss of bowel control? N  Managing your Medications? N  Managing your Finances? N  Housekeeping or managing your Housekeeping? N  Some recent data might be hidden     Immunizations and Health Maintenance Immunization History  Administered Date(s) Administered  . Influenza, High Dose Seasonal PF 01/18/2017  . Influenza,inj,Quad PF,6+ Mos 12/31/2014  . Influenza-Unspecified 01/17/2017  . Pneumococcal Conjugate-13 01/27/2016  . Pneumococcal Polysaccharide-23 02/25/2017  . Tdap 03/08/2016   Health Maintenance Due  Topic Date Due  . COLONOSCOPY  01/23/2017    Patient Care Team: Juline Patch, MD as PCP - General (Family Medicine) Sharolyn Douglas, MD as Consulting Physician (Neurology) Leim Fabry, MD as Consulting Physician (Orthopedic Surgery)  Indicate any recent Medical Services you may have received from other than [facility] providers in the past year (date may be approximate).    Assessment:   This is a routine wellness examination for Travis Robertson.  Hearing/Vision screen Vision Screening Comments: Sees Dr. Everrett Coombe for annual eye exams  Dietary issues and exercise activities discussed: Current Exercise Habits: Home exercise routine, Type of exercise: walking, Time (Minutes): > 60, Frequency (Times/Week): 7, Weekly Exercise (Minutes/Week): 0, Intensity: Mild, Exercise limited by: neurologic condition(s)(Corticobasal syndrome)  Goals    . DIET - INCREASE WATER INTAKE     Recommend to drink at least 6-8 8oz glasses of water per day.      Depression Screen PHQ 2/9 Scores 07/04/2017 07/04/2017 02/25/2017 01/18/2017  PHQ - 2 Score 0 0 1 2  PHQ- 9 Score 0 - 8 11      Fall Risk Fall Risk  07/04/2017 04/08/2017 09/01/2016 07/15/2015 12/31/2014  Falls in the past year? Yes Yes No No No  Number falls in past yr: 2 or more 1 - - -  Injury with Fall? Yes Yes - - -  Comment R shoulder fracture and broke L ankle broke L) ankle - - -  Risk Factor Category  High Fall Risk - - - -  Comment corticobasal syndome - - - -  Risk for fall due to : History of fall(s);Impaired balance/gait;Impaired vision;Medication side effect - - - -  Risk for fall due to: Comment wears eyeglasses; corticobasal syndrome - - - -  Follow up Falls evaluation completed;Education provided;Falls prevention discussed Falls evaluation completed - - -    Is the patient's home free of loose throw rugs in walkways, pet beds, electrical cords, etc?   Yes Does the patient have any  grab bars in the bathroom? Yes  Does the patient use a shower chair when bathing? Yes Does the patient have any stairs in or around the home? Yes If so, are there any handrails?  Yes Does the patient have adequate lighting?  Yes Does the patient use a cane, walker or w/c? No Does the patient use of an elevated toilet seat? Yes  Timed Get Up and Go Performed: Yes. Pt ambulated 10 feet within 19 sec. Gait slow, steady and with a shuffling-like appearance. Pt did not use an assistive device during ambulation. Given new diagnosis of Corticobasal Syndrome, pt is in the process of being scheduled for strengthening exercises with [facility]. Fall risk prevention has been discussed.  Community Resource Referral not required at this time.  Cognitive Function:     6CIT Screen 07/04/2017  What Year? 0 points  What month? 0 points  What time? 0 points  Count back from 20 0 points  Months in reverse 0 points  Repeat phrase 2 points  Total Score 2    Screening Tests Health Maintenance  Topic Date Due  . COLONOSCOPY  01/23/2017  . TETANUS/TDAP  03/08/2026  . INFLUENZA VACCINE  Completed  . Hepatitis C Screening  Completed   . PNA vac Low Risk Adult  Completed    Qualifies for Shingles Vaccine? Yes. Due for Zostavax or Shingrix vaccine. Education has been provided regarding the importance of this vaccine. Pt has been advised to call her insurance company to determine his out of pocket expense. Advised he may also receive this vaccine at his local pharmacy or Health Dept. Verbalized acceptance and understanding.  Cancer Screenings: Lung: Low Dose CT Chest recommended if Age 22-80 years, 30 pack-year currently smoking OR have quit w/in 15years. Patient does qualify. An Epic message has been sent to Burgess Estelle, RN (Oncology Nurse Navigator) regarding the possible need for this exam. Raquel Sarna will review the patient's chart to determine if the patient truly qualifies for the exam. If the patient qualifies, Raquel Sarna will order the Low Dose CT of the chest to facilitate the scheduling of this exam. Colorectal: Completed colonoscopy 01/24/12. Repeat every 5 years. Referral to GI sent to referral coordinator for scheduling purposes. Pt was advised he will receive a call from our office regarding his appt.  Additional Screenings: Hepatitis B/HIV/Syphillis: Does not qualify Hepatitis C Screening: Completed 09/01/16     Plan:  I have personally reviewed and addressed the Medicare Annual Wellness questionnaire and have noted the following in the patient's chart:  A. Medical and social history B. Use of alcohol, tobacco or illicit drugs  C. Current medications and supplements D. Functional ability and status E.  Nutritional status F.  Physical activity G. Advance directives H. List of other physicians I.  Hospitalizations, surgeries, and ER visits in previous 12 months J.  [place] such as hearing and vision if needed, cognitive and depression L. Referrals and appointments - none  In addition, I have reviewed and discussed with patient certain preventive protocols, quality metrics, and best practice  recommendations. A written personalized care plan for preventive services as well as general preventive health recommendations were provided to patient.  Signed,  Aleatha Borer, LPN Nurse Health Advisor  MD Recommendations: Due for Zostavax or Shingrix vaccine. Education has been provided regarding the importance of this vaccine. Pt has been advised to call her insurance company to determine his out of pocket expense. Advised he may also receive this vaccine at his local  pharmacy or Health Dept. Verbalized acceptance and understanding.  Due for colonoscopy. Completed colonoscopy 01/24/12. Repeat every 5 years. Referral to GI sent to referral coordinator for scheduling purposes. Pt was advised he will receive a call from our office regarding his appt.  Lung Cancer Screening: Patient does qualify. An Epic message has been sent to Burgess Estelle, RN (Oncology Nurse Navigator) regarding the possible need for this exam. Raquel Sarna will review the patient's chart to determine if the patient truly qualifies for the exam. If the patient qualifies, Raquel Sarna will order the Low Dose CT of the chest to facilitate the scheduling of this exam.

## 2017-07-04 NOTE — Patient Instructions (Signed)
Travis Robertson , Thank you for taking time to come for your Medicare Wellness Visit. I appreciate your ongoing commitment to your health goals. Please review the following plan we discussed and let me know if I can assist you in the future.   Screening recommendations/referrals: Colorectal Screening: Completed colonoscopy 01/24/12. Repeat every 5 years. Our office will call you about your appointment. Lung Cancer Screening: You will receive a call from Burgess Estelle, RN to arrange for your CT scan.  Vision/Dental Exams: Recommended yearly ophthalmology/optometry visit for glaucoma screening and checkup Recommended yearly dental visit for hygiene and checkup  Vaccinations: Influenza vaccine: Up to date Pneumococcal vaccine: Completed series Tdap vaccine: Up to date Shingles vaccine: Please call your insurance company to determine your out of pocket expense for the Shingrix vaccine. You may also receive this vaccine at your local pharmacy or Health Dept.    Advanced directives: Please bring a copy of your POA (Power of Attorney) and/or Living Will to your next appointment.  Conditions/risks identified: Recommend to drink at least 6-8 8oz glasses of water per day.  Next appointment: Please schedule your Annual Wellness Visit with your Nurse Health Advisor in one year.  Preventive Care 40-64 Years, Male Preventive care refers to lifestyle choices and visits with your health care provider that can promote health and wellness. What does preventive care include?  A yearly physical exam. This is also called an annual well check.  Dental exams once or twice a year.  Routine eye exams. Ask your health care provider how often you should have your eyes checked.  Personal lifestyle choices, including:  Daily care of your teeth and gums.  Regular physical activity.  Eating a healthy diet.  Avoiding tobacco and drug use.  Limiting alcohol use.  Practicing safe sex.  Taking low-dose  aspirin every day starting at age 47. What happens during an annual well check? The services and screenings done by your health care provider during your annual well check will depend on your age, overall health, lifestyle risk factors, and family history of disease. Counseling  Your health care provider may ask you questions about your:  Alcohol use.  Tobacco use.  Drug use.  Emotional well-being.  Home and relationship well-being.  Sexual activity.  Eating habits.  Work and work Statistician. Screening  You may have the following tests or measurements:  Height, weight, and BMI.  Blood pressure.  Lipid and cholesterol levels. These may be checked every 5 years, or more frequently if you are over 108 years old.  Skin check.  Lung cancer screening. You may have this screening every year starting at age 49 if you have a 30-pack-year history of smoking and currently smoke or have quit within the past 15 years.  Fecal occult blood test (FOBT) of the stool. You may have this test every year starting at age 33.  Flexible sigmoidoscopy or colonoscopy. You may have a sigmoidoscopy every 5 years or a colonoscopy every 10 years starting at age 10.  Prostate cancer screening. Recommendations will vary depending on your family history and other risks.  Hepatitis C blood test.  Hepatitis B blood test.  Sexually transmitted disease (STD) testing.  Diabetes screening. This is done by checking your blood sugar (glucose) after you have not eaten for a while (fasting). You may have this done every 1-3 years. Discuss your test results, treatment options, and if necessary, the need for more tests with your health care provider. Vaccines  Your health care provider  may recommend certain vaccines, such as:  Influenza vaccine. This is recommended every year.  Tetanus, diphtheria, and acellular pertussis (Tdap, Td) vaccine. You may need a Td booster every 10 years.  Zoster vaccine. You  may need this after age 76.  Pneumococcal 13-valent conjugate (PCV13) vaccine. You may need this if you have certain conditions and have not been vaccinated.  Pneumococcal polysaccharide (PPSV23) vaccine. You may need one or two doses if you smoke cigarettes or if you have certain conditions. Talk to your health care provider about which screenings and vaccines you need and how often you need them. This information is not intended to replace advice given to you by your health care provider. Make sure you discuss any questions you have with your health care provider. Document Released: 05/09/2015 Document Revised: 12/31/2015 Document Reviewed: 02/11/2015 Elsevier Interactive Patient Education  2017 [place] Prevention in the Home Falls can cause injuries. They can happen to people of all ages. There are many things you can do to make your home safe and to help prevent falls. What can I do on the outside of my home?  Regularly fix the edges of walkways and driveways and fix any cracks.  Remove anything that might make you trip as you walk through a door, such as a raised step or threshold.  Trim any bushes or trees on the path to your home.  Use bright outdoor lighting.  Clear any walking paths of anything that might make someone trip, such as rocks or tools.  Regularly check to see if handrails are loose or broken. Make sure that both sides of any steps have handrails.  Any raised decks and porches should have guardrails on the edges.  Have any leaves, snow, or ice cleared regularly.  Use sand or salt on walking paths during winter.  Clean up any spills in your garage right away. This includes oil or grease spills. What can I do in the bathroom?  Use night lights.  Install grab bars by the toilet and in the tub and shower. Do not use towel bars as grab bars.  Use non-skid mats or decals in the tub or shower.  If you need to sit down in the shower, use a plastic,  non-slip stool.  Keep the floor dry. Clean up any water that spills on the floor as soon as it happens.  Remove soap buildup in the tub or shower regularly.  Attach bath mats securely with double-sided non-slip rug tape.  Do not have throw rugs and other things on the floor that can make you trip. What can I do in the bedroom?  Use night lights.  Make sure that you have a light by your bed that is easy to reach.  Do not use any sheets or blankets that are too big for your bed. They should not hang down onto the floor.  Have a firm chair that has side arms. You can use this for support while you get dressed.  Do not have throw rugs and other things on the floor that can make you trip. What can I do in the kitchen?  Clean up any spills right away.  Avoid walking on wet floors.  Keep items that you use a lot in easy-to-reach places.  If you need to reach something above you, use a strong step stool that has a grab bar.  Keep electrical cords out of the way.  Do not use floor polish or wax that makes  floors slippery. If you must use wax, use non-skid floor wax.  Do not have throw rugs and other things on the floor that can make you trip. What can I do with my stairs?  Do not leave any items on the stairs.  Make sure that there are handrails on both sides of the stairs and use them. Fix handrails that are broken or loose. Make sure that handrails are as long as the stairways.  Check any carpeting to make sure that it is firmly attached to the stairs. Fix any carpet that is loose or worn.  Avoid having throw rugs at the top or bottom of the stairs. If you do have throw rugs, attach them to the floor with carpet tape.  Make sure that you have a light switch at the top of the stairs and the bottom of the stairs. If you do not have them, ask someone to add them for you. What else can I do to help prevent falls?  Wear shoes that:  Do not have high heels.  Have rubber  bottoms.  Are comfortable and fit you well.  Are closed at the toe. Do not wear sandals.  If you use a stepladder:  Make sure that it is fully opened. Do not climb a closed stepladder.  Make sure that both sides of the stepladder are locked into place.  Ask someone to hold it for you, if possible.  Clearly mark and make sure that you can see:  Any grab bars or handrails.  First and last steps.  Where the edge of each step is.  Use tools that help you move around (mobility aids) if they are needed. These include:  Canes.  Walkers.  Scooters.  Crutches.  Turn on the lights when you go into a dark area. Replace any light bulbs as soon as they burn out.  Set up your furniture so you have a clear path. Avoid moving your furniture around.  If any of your floors are uneven, fix them.  If there are any pets around you, be aware of where they are.  Review your medicines with your doctor. Some medicines can make you feel dizzy. This can increase your chance of falling. Ask your doctor what other things that you can do to help prevent falls. This information is not intended to replace advice given to you by your health care provider. Make sure you discuss any questions you have with your health care provider. Document Released: 02/06/2009 Document Revised: 09/18/2015 Document Reviewed: 05/17/2014 Elsevier Interactive Patient Education  2017 [employer].

## 2017-07-05 ENCOUNTER — Other Ambulatory Visit: Payer: Self-pay | Admitting: Family Medicine

## 2017-07-05 DIAGNOSIS — E785 Hyperlipidemia, unspecified: Secondary | ICD-10-CM

## 2017-07-06 ENCOUNTER — Telehealth: Payer: Self-pay | Admitting: *Deleted

## 2017-07-06 ENCOUNTER — Telehealth: Payer: Self-pay

## 2017-07-06 DIAGNOSIS — Z87891 Personal history of nicotine dependence: Secondary | ICD-10-CM

## 2017-07-06 DIAGNOSIS — Z122 Encounter for screening for malignant neoplasm of respiratory organs: Secondary | ICD-10-CM

## 2017-07-06 NOTE — Telephone Encounter (Signed)
Gastroenterology Pre-Procedure Review  Request Date: 07/15/17   Requesting Physician: Dr. Allen Norris   PATIENT REVIEW QUESTIONS: The patient responded to the following health history questions as indicated:    1. Are you having any GI issues? No  2. Do you have a personal history of Polyps? Yes, tubular adenoma 2013 3. Do you have a family history of Colon Cancer or Polyps? No  4. Diabetes Mellitus? No  5. Joint replacements in the past 12 months? No  6. Major health problems in the past 3 months? No  7. Any artificial heart valves, MVP, or defibrillator? No     MEDICATIONS & ALLERGIES:    Patient reports the following regarding taking any anticoagulation/antiplatelet therapy:   Plavix, Coumadin, Eliquis, Xarelto, Lovenox, Pradaxa, Brilinta, or Effient? No  Aspirin? Yes   Patient confirms/reports the following medications:  Current Outpatient Medications  Medication Sig Dispense Refill  . aspirin 81 MG tablet Take 81 mg by mouth daily.    Marland Kitchen atorvastatin (LIPITOR) 10 MG tablet TAKE 1 TABLET DAILY 90 tablet 0  . carbidopa-levodopa (SINEMET) 25-100 MG tablet Take 3 tablets by mouth 3 (three) times daily.    . metoprolol succinate (TOPROL-XL) 100 MG 24 hr tablet TAKE 1 TABLET DAILY. TAKE  WITH OR IMMEDIATELY        FOLLOWING A MEAL. 90 tablet 1  . sertraline (ZOLOFT) 50 MG tablet Take 1 tablet (50 mg total) by mouth daily. May increase to one and a half q day 45 tablet 6  . losartan-hydrochlorothiazide (HYZAAR) 50-12.5 MG tablet Take 1 tablet by mouth daily. 90 tablet 1   No current facility-administered medications for this visit.     Patient confirms/reports the following allergies:  No Known Allergies  No orders of the defined types were placed in this encounter.   AUTHORIZATION INFORMATION Primary Insurance: 1D#: Group #:  Secondary Insurance: 1D#: Group #:  SCHEDULE INFORMATION: Date: 07/15/17 Time: Location: [place]

## 2017-07-06 NOTE — Telephone Encounter (Signed)
Received referral for initial lung cancer screening scan. Contacted patient and obtained smoking history,(former, quit 2017, 60 pack year) as well as answering questions related to screening process. Patient denies signs of lung cancer such as weight loss or hemoptysis. Patient denies comorbidity that would prevent curative treatment if lung cancer were found. Patient is scheduled for shared decision making visit and CT scan on 07/26/17.

## 2017-07-06 NOTE — Telephone Encounter (Signed)
Patient reports CT of chest done at [facility] earlier this year. Verified this and will schedule lung screening 1 year from that date.

## 2017-07-07 ENCOUNTER — Other Ambulatory Visit: Payer: Self-pay

## 2017-07-10 ENCOUNTER — Other Ambulatory Visit: Payer: Self-pay

## 2017-07-10 DIAGNOSIS — Z1211 Encounter for screening for malignant neoplasm of colon: Secondary | ICD-10-CM

## 2017-07-11 NOTE — Discharge Instructions (Signed)
General Anesthesia, Adult, Care After °These instructions provide you with information about caring for yourself after your procedure. Your health care provider may also give you more specific instructions. Your treatment has been planned according to current medical practices, but problems sometimes occur. Call your health care provider if you have any problems or questions after your procedure. °What can I expect after the procedure? °After the procedure, it is common to have: °· Vomiting. °· A sore throat. °· Mental slowness. ° °It is common to feel: °· Nauseous. °· Cold or shivery. °· Sleepy. °· Tired. °· Sore or achy, even in parts of your body where you did not have surgery. ° °Follow these instructions at home: °For at least 24 hours after the procedure: °· Do not: °? Participate in activities where you could fall or become injured. °? Drive. °? Use heavy machinery. °? Drink alcohol. °? Take sleeping pills or medicines that cause drowsiness. °? Make important decisions or sign legal documents. °? Take care of children on your own. °· Rest. °Eating and drinking °· If you vomit, drink water, juice, or soup when you can drink without vomiting. °· Drink enough fluid to keep your urine clear or pale yellow. °· Make sure you have little or no nausea before eating solid foods. °· Follow the diet recommended by your health care provider. °General instructions °· Have a responsible adult stay with you until you are awake and alert. °· Return to your normal activities as told by your health care provider. Ask your health care provider what activities are safe for you. °· Take over-the-counter and prescription medicines only as told by your health care provider. °· If you smoke, do not smoke without supervision. °· Keep all follow-up visits as told by your health care provider. This is important. °Contact a health care provider if: °· You continue to have nausea or vomiting at home, and medicines are not helpful. °· You  cannot drink fluids or start eating again. °· You cannot urinate after 8-12 hours. °· You develop a skin rash. °· You have fever. °· You have increasing redness at the site of your procedure. °Get help right away if: °· You have difficulty breathing. °· You have chest pain. °· You have unexpected bleeding. °· You feel that you are having a life-threatening or urgent problem. °This information is not intended to replace advice given to you by your health care provider. Make sure you discuss any questions you have with your health care provider. °Document Released: 07/19/2000 Document Revised: 09/15/2015 Document Reviewed: 03/27/2015 °Elsevier Interactive Patient Education © 2018 Elsevier Inc. ° °

## 2017-07-13 ENCOUNTER — Other Ambulatory Visit: Payer: Self-pay

## 2017-07-13 ENCOUNTER — Telehealth: Payer: Self-pay | Admitting: Gastroenterology

## 2017-07-13 MED ORDER — NA SULFATE-K SULFATE-MG SULF 17.5-3.13-1.6 GM/177ML PO SOLN: 1.0000 | ORAL | 0 refills | Status: DC

## 2017-07-13 NOTE — Telephone Encounter (Signed)
Sent pt's rx to [facility] per pt request.

## 2017-07-13 NOTE — Telephone Encounter (Signed)
Travis Robertson from [facility] called  To let us know pt wants [place] to call [facility] in [place] to [facility] in rx she states please call pt once its called in

## 2017-07-14 ENCOUNTER — Encounter: Payer: Self-pay | Admitting: Family Medicine

## 2017-07-14 ENCOUNTER — Ambulatory Visit (INDEPENDENT_AMBULATORY_CARE_PROVIDER_SITE_OTHER): Payer: Medicare Other | Admitting: Family Medicine

## 2017-07-14 VITALS — BP 110/78 | HR 80 | Ht 68.0 in | Wt 165.0 lb

## 2017-07-14 DIAGNOSIS — Z0000 Encounter for general adult medical examination without abnormal findings: Secondary | ICD-10-CM | POA: Diagnosis not present

## 2017-07-14 DIAGNOSIS — I2510 Atherosclerotic heart disease of native coronary artery without angina pectoris: Secondary | ICD-10-CM | POA: Diagnosis not present

## 2017-07-14 DIAGNOSIS — F330 Major depressive disorder, recurrent, mild: Secondary | ICD-10-CM

## 2017-07-14 DIAGNOSIS — Z1211 Encounter for screening for malignant neoplasm of colon: Secondary | ICD-10-CM

## 2017-07-14 DIAGNOSIS — I714 Abdominal aortic aneurysm, without rupture, unspecified: Secondary | ICD-10-CM

## 2017-07-14 DIAGNOSIS — I700 Atherosclerosis of aorta: Secondary | ICD-10-CM | POA: Diagnosis not present

## 2017-07-14 DIAGNOSIS — E041 Nontoxic single thyroid nodule: Secondary | ICD-10-CM

## 2017-07-14 DIAGNOSIS — G3185 Corticobasal degeneration: Secondary | ICD-10-CM | POA: Diagnosis not present

## 2017-07-14 LAB — HEMOCCULT GUIAC POC 1CARD (OFFICE): Fecal Occult Blood, POC: NEGATIVE

## 2017-07-14 NOTE — Progress Notes (Signed)
Name: Travis Robertson   MRN: [id number]    DOB: 09/30/1950   Date:07/14/2017       Progress Note  Subjective  Chief Complaint  Chief Complaint  Patient presents with  . Annual Exam    has colonoscopy tomorrow    Patient presents for annual physical exam.   No problem-specific Assessment & Plan notes found for this encounter.   Past Medical History:  Diagnosis Date  . Arthritis    hands  . Corticobasal syndrome   . Hyperlipidemia   . Hypertension   . Impingement syndrome of shoulder     Past Surgical History:  Procedure Laterality Date  . APPENDECTOMY    . CATARACT EXTRACTION Bilateral   . COLONOSCOPY  01/24/2012   cleared for 5 yrs- [facility]  . RETINAL DETACHMENT SURGERY Bilateral     Family History  Problem Relation Age of Onset  . Dementia Mother   . Stroke Father     Social History   Socioeconomic History  . Marital status: Married    Spouse name: Not on file  . Number of children: 2  . Years of education: Not on file  . Highest education level: Bachelor's degree (e.g., BA, AB, BS)  Occupational History  . Occupation: Retired  Scientific laboratory technician  . Financial resource strain: Not hard at all  . Food insecurity:    Worry: Never true    Inability: Never true  . Transportation needs:    Medical: No    Non-medical: No  Tobacco Use  . Smoking status: Former Smoker    Packs/day: 1.50    Years: 40.00    Pack years: 60.00    Types: Cigarettes    Last attempt to quit: 07/2015    Years since quitting: 1.9  . Smokeless tobacco: Never Used  . Tobacco comment: smoking cessation materials not required  Substance and Sexual Activity  . Alcohol use: Yes    Alcohol/week: 8.4 oz    Types: 14 Cans of beer per week  . Drug use: No  . Sexual activity: Never  Lifestyle  . Physical activity:    Days per week: 7 days    Minutes per session: 70 min  . Stress: Not at all  Relationships  . Social connections:    Talks on phone: Patient refused    Gets  together: Patient refused    Attends religious service: Patient refused    Active member of club or organization: Patient refused    Attends meetings of clubs or organizations: Patient refused    Relationship status: Married  . Intimate partner violence:    Fear of current or ex partner: No    Emotionally abused: No    Physically abused: No    Forced sexual activity: No  Other Topics Concern  . Not on file  Social History Narrative  . Not on file    No Known Allergies  Outpatient Medications Prior to Visit  Medication Sig Dispense Refill  . aspirin 81 MG tablet Take 81 mg by mouth daily.    Marland Kitchen atorvastatin (LIPITOR) 10 MG tablet TAKE 1 TABLET DAILY 90 tablet 0  . carbidopa-levodopa (SINEMET) 25-100 MG tablet Take 3 tablets by mouth 3 (three) times daily.    Marland Kitchen losartan-hydrochlorothiazide (HYZAAR) 50-12.5 MG tablet Take 1 tablet by mouth daily. 90 tablet 1  . metoprolol succinate (TOPROL-XL) 100 MG 24 hr tablet TAKE 1 TABLET DAILY. TAKE  WITH OR IMMEDIATELY  FOLLOWING A MEAL. 90 tablet 1  . Na Sulfate-K Sulfate-Mg Sulf (SUPREP BOWEL PREP KIT) 17.5-3.13-1.6 GM/177ML SOLN Take 1 kit by mouth as directed. 1 Bottle 0  . sertraline (ZOLOFT) 50 MG tablet Take 1 tablet (50 mg total) by mouth daily. May increase to one and a half q day 45 tablet 6   No facility-administered medications prior to visit.     Review of Systems  Constitutional: Negative for chills, fever, malaise/fatigue and weight loss.  HENT: Negative for ear discharge, ear pain and sore throat.   Eyes: Negative for blurred vision.  Respiratory: Negative for cough, sputum production, shortness of breath and wheezing.   Cardiovascular: Negative for chest pain, palpitations and leg swelling.  Gastrointestinal: Negative for abdominal pain, blood in stool, constipation, diarrhea, heartburn, melena and nausea.  Genitourinary: Negative for dysuria, frequency, hematuria and urgency.  Musculoskeletal: Negative for back pain,  joint pain, myalgias and neck pain.  Skin: Negative for rash.  Neurological: Negative for dizziness, tingling, sensory change, focal weakness and headaches.  Endo/Heme/Allergies: Negative for environmental allergies and polydipsia. Does not bruise/bleed easily.  Psychiatric/Behavioral: Negative for depression and suicidal ideas. The patient is not nervous/anxious and does not have insomnia.      Objective  Vitals:   07/14/17 0940  BP: 110/78  Pulse: 80  Weight: 165 lb (74.8 kg)  Height: _0  (1.727 m)    Physical Exam  Constitutional: He is oriented to person, place, and time and well-developed, well-nourished, and in no distress.  HENT:  Head: Normocephalic.  Right Ear: External ear normal.  Left Ear: External ear normal.  Nose: Nose normal.  Mouth/Throat: Oropharynx is clear and moist.  Eyes: Pupils are equal, round, and reactive to light. Conjunctivae and EOM are normal. Right eye exhibits no discharge. Left eye exhibits no discharge. No scleral icterus.  Neck: Normal range of motion. Neck supple. No JVD present. No tracheal deviation present. No thyromegaly present.  Cardiovascular: Normal rate, regular rhythm, normal heart sounds and intact distal pulses. Exam reveals no gallop and no friction rub.  No murmur heard. Pulmonary/Chest: Breath sounds normal. No respiratory distress. He has no wheezes. He has no rales.  Abdominal: Soft. Bowel sounds are normal. He exhibits no mass. There is no hepatosplenomegaly. There is no tenderness. There is no rebound, no guarding and no CVA tenderness.  Musculoskeletal: Normal range of motion. He exhibits no edema or tenderness.  Lymphadenopathy:    He has no cervical adenopathy.  Neurological: He is alert and oriented to person, place, and time. He has normal sensation, normal strength, normal reflexes and intact cranial nerves. No cranial nerve deficit.  Skin: Skin is warm. No rash noted.  Psychiatric: Mood and affect normal.  Nursing  note and vitals reviewed.     Assessment & Plan  Problem List Items Addressed This Visit      Other   Mild episode of recurrent major depressive disorder ([place])    Other Visit Diagnoses    Annual physical exam    -  Primary   Relevant Orders   POCT occult blood stool (Completed)   Thyroid nodule       Relevant Orders   Ambulatory referral to Endocrinology   Atherosclerosis of coronary artery of native heart, angina presence unspecified, unspecified vessel or lesion type       Aortic atherosclerosis (HCC)       Abdominal aortic aneurysm (AAA) without rupture ([place])       3,3 cm/stable   Corticobasal  syndrome       fall risk   Colon cancer screening       Relevant Orders   POCT occult blood stool (Completed)      No orders of the defined types were placed in this encounter.     Dr. Macon [facility]  07/14/17

## 2017-07-15 ENCOUNTER — Ambulatory Visit
Admission: RE | Admit: 2017-07-15 | Discharge: 2017-07-15 | Disposition: A | Discharge status: Home / Self Care | Payer: Medicare Other | Source: Ambulatory Visit | Attending: Gastroenterology | Admitting: Gastroenterology

## 2017-07-15 ENCOUNTER — Encounter
Admission: RE | Disposition: A | Discharge status: Home / Self Care | Payer: Self-pay | Source: Ambulatory Visit | Attending: Gastroenterology

## 2017-07-15 ENCOUNTER — Ambulatory Visit: Payer: Medicare Other | Admitting: Anesthesiology

## 2017-07-15 DIAGNOSIS — Z1211 Encounter for screening for malignant neoplasm of colon: Secondary | ICD-10-CM | POA: Insufficient documentation

## 2017-07-15 DIAGNOSIS — Z7982 Long term (current) use of aspirin: Secondary | ICD-10-CM | POA: Diagnosis not present

## 2017-07-15 DIAGNOSIS — Z87891 Personal history of nicotine dependence: Secondary | ICD-10-CM | POA: Insufficient documentation

## 2017-07-15 DIAGNOSIS — K635 Polyp of colon: Secondary | ICD-10-CM | POA: Diagnosis not present

## 2017-07-15 DIAGNOSIS — M19042 Primary osteoarthritis, left hand: Secondary | ICD-10-CM | POA: Diagnosis not present

## 2017-07-15 DIAGNOSIS — Z860100 Personal history of colon polyps, unspecified: Secondary | ICD-10-CM

## 2017-07-15 DIAGNOSIS — D122 Benign neoplasm of ascending colon: Secondary | ICD-10-CM | POA: Insufficient documentation

## 2017-07-15 DIAGNOSIS — I10 Essential (primary) hypertension: Secondary | ICD-10-CM | POA: Diagnosis not present

## 2017-07-15 DIAGNOSIS — D125 Benign neoplasm of sigmoid colon: Secondary | ICD-10-CM | POA: Diagnosis not present

## 2017-07-15 DIAGNOSIS — M19041 Primary osteoarthritis, right hand: Secondary | ICD-10-CM | POA: Diagnosis not present

## 2017-07-15 DIAGNOSIS — E785 Hyperlipidemia, unspecified: Secondary | ICD-10-CM | POA: Diagnosis not present

## 2017-07-15 DIAGNOSIS — D124 Benign neoplasm of descending colon: Secondary | ICD-10-CM

## 2017-07-15 DIAGNOSIS — Z79899 Other long term (current) drug therapy: Secondary | ICD-10-CM | POA: Diagnosis not present

## 2017-07-15 DIAGNOSIS — K621 Rectal polyp: Secondary | ICD-10-CM

## 2017-07-15 HISTORY — PX: POLYPECTOMY: SHX149

## 2017-07-15 HISTORY — PX: COLONOSCOPY WITH PROPOFOL: SHX5780

## 2017-07-15 HISTORY — DX: Unspecified osteoarthritis, unspecified site: M19.90

## 2017-07-15 SURGERY — COLONOSCOPY WITH PROPOFOL
Anesthesia: General

## 2017-07-15 MED ORDER — LACTATED RINGERS IV SOLN
INTRAVENOUS | Status: DC
  Administered 2017-07-15 (×2): via INTRAVENOUS

## 2017-07-15 MED ORDER — ACETAMINOPHEN 325 MG PO TABS: 325.0000 mg | ORAL_TABLET | Freq: Once | ORAL | Status: DC

## 2017-07-15 MED ORDER — PROPOFOL 10 MG/ML IV BOLUS
INTRAVENOUS | Status: DC | PRN
  Administered 2017-07-15: 20 mg via INTRAVENOUS
  Administered 2017-07-15: 50 mg via INTRAVENOUS
  Administered 2017-07-15: 100 mg via INTRAVENOUS
  Administered 2017-07-15: 50 mg via INTRAVENOUS

## 2017-07-15 MED ORDER — ACETAMINOPHEN 160 MG/5ML PO SOLN: 325.0000 mg | Freq: Once | ORAL | Status: DC

## 2017-07-15 MED ORDER — LIDOCAINE HCL (CARDIAC) 20 MG/ML IV SOLN
INTRAVENOUS | Status: DC | PRN
  Administered 2017-07-15: 30 mg via INTRAVENOUS

## 2017-07-15 SURGICAL SUPPLY — 24 items
CANISTER SUCT 1200ML W/VALVE (MISCELLANEOUS) ×4 IMPLANT
CLIP HMST 235XBRD CATH ROT (MISCELLANEOUS) IMPLANT
CLIP RESOLUTION 360 11X235 (MISCELLANEOUS)
ELECT REM PT RETURN 9FT ADLT (ELECTROSURGICAL)
ELECTRODE REM PT RTRN 9FT ADLT (ELECTROSURGICAL) IMPLANT
FCP ESCP3.2XJMB 240X2.8X (MISCELLANEOUS)
FORCEPS BIOP RAD 4 LRG CAP 4 (CUTTING FORCEPS) ×4 IMPLANT
FORCEPS BIOP RJ4 240 W/NDL (MISCELLANEOUS)
FORCEPS ESCP3.2XJMB 240X2.8X (MISCELLANEOUS) IMPLANT
GOWN CVR UNV OPN BCK APRN NK (MISCELLANEOUS) ×4 IMPLANT
GOWN ISOL THUMB LOOP REG UNIV (MISCELLANEOUS) ×4
INJECTOR VARIJECT VIN23 (MISCELLANEOUS) IMPLANT
KIT DEFENDO VALVE AND CONN (KITS) IMPLANT
KIT ENDO PROCEDURE OLY (KITS) ×4 IMPLANT
MARKER SPOT ENDO TATTOO 5ML (MISCELLANEOUS) IMPLANT
PROBE APC STR FIRE (PROBE) IMPLANT
RETRIEVER NET ROTH 2.5X230 LF (MISCELLANEOUS) IMPLANT
SNARE SHORT THROW 13M SML OVAL (MISCELLANEOUS) IMPLANT
SNARE SHORT THROW 30M LRG OVAL (MISCELLANEOUS) IMPLANT
SNARE SNG USE RND 15MM (INSTRUMENTS) IMPLANT
SPOT EX ENDOSCOPIC TATTOO (MISCELLANEOUS)
TRAP ETRAP POLY (MISCELLANEOUS) IMPLANT
VARIJECT INJECTOR VIN23 (MISCELLANEOUS)
WATER STERILE IRR 250ML POUR (IV SOLUTION) ×4 IMPLANT

## 2017-07-15 NOTE — Transfer of Care (Signed)
Immediate Anesthesia Transfer of Care Note  Patient: Travis Robertson  Procedure(s) Performed: COLONOSCOPY WITH PROPOFOL (N/A ) POLYPECTOMY INTESTINAL  Patient Location: PACU  Anesthesia Type: General  Level of Consciousness: awake, alert  and patient cooperative  Airway and Oxygen Therapy: Patient Spontanous Breathing and Patient connected to supplemental oxygen  Post-op Assessment: Post-op Vital signs reviewed, Patient's Cardiovascular Status Stable, Respiratory Function Stable, Patent Airway and No signs of Nausea or vomiting  Post-op Vital Signs: Reviewed and stable  Complications: No apparent anesthesia complications

## 2017-07-15 NOTE — Anesthesia Preprocedure Evaluation (Signed)
Anesthesia Evaluation  Patient identified by MRN, date of birth, ID band Patient awake    Reviewed: Allergy & Precautions, H&P , NPO status , Patient's Chart, lab work & pertinent test results  Airway Mallampati: II  TM Distance: >3 FB Neck ROM: full    Dental no notable dental hx.    Pulmonary former smoker,    Pulmonary exam normal breath sounds clear to auscultation       Cardiovascular hypertension, Normal cardiovascular exam Rhythm:regular Rate:Normal     Neuro/Psych PSYCHIATRIC DISORDERS    GI/Hepatic   Endo/Other    Renal/GU      Musculoskeletal   Abdominal   Peds  Hematology   Anesthesia Other Findings   Reproductive/Obstetrics                             Anesthesia Physical Anesthesia Plan  ASA: III  Anesthesia Plan: General   Post-op Pain Management:    Induction: Intravenous  PONV Risk Score and Plan: 2 and Propofol infusion and Treatment may vary due to age or medical condition  Airway Management Planned: Natural Airway  Additional Equipment:   Intra-op Plan:   Post-operative Plan:   Informed Consent: I have reviewed the patients History and Physical, chart, labs and discussed the procedure including the risks, benefits and alternatives for the proposed anesthesia with the patient or authorized representative who has indicated his/her understanding and acceptance.     Plan Discussed with: CRNA  Anesthesia Plan Comments:         Anesthesia Quick Evaluation

## 2017-07-15 NOTE — Anesthesia Procedure Notes (Signed)
Date/Time: 07/15/2017 10:32 AM Performed by: Cameron Ali, CRNA Pre-anesthesia Checklist: Patient identified, Emergency Drugs available, Suction available, Timeout performed and Patient being monitored Patient Re-evaluated:Patient Re-evaluated prior to induction Oxygen Delivery Method: Nasal cannula Placement Confirmation: positive ETCO2

## 2017-07-15 NOTE — H&P (Signed)
Lucilla Lame, MD [address] [place], [place] [address] Phone:[phone] Fax : [phone]  Primary Care Physician:  Juline Patch, MD Primary Gastroenterologist:  Dr. Allen Norris  Pre-Procedure History & Physical: HPI:  Travis Robertson is a 67 y.o. male is here for an colonoscopy.   Past Medical History:  Diagnosis Date  . Arthritis    hands  . Corticobasal syndrome   . Hyperlipidemia   . Hypertension   . Impingement syndrome of shoulder     Past Surgical History:  Procedure Laterality Date  . APPENDECTOMY    . CATARACT EXTRACTION Bilateral   . COLONOSCOPY  01/24/2012   cleared for 5 yrs- [facility]  . RETINAL DETACHMENT SURGERY Bilateral     Prior to Admission medications   Medication Sig Start Date End Date Taking? Authorizing Provider  aspirin 81 MG tablet Take 81 mg by mouth daily.   Yes [provider]  atorvastatin (LIPITOR) 10 MG tablet TAKE 1 TABLET DAILY 07/05/17  Yes Juline Patch, MD  carbidopa-levodopa (SINEMET) 25-100 MG tablet Take 3 tablets by mouth 3 (three) times daily. 04/21/17  Yes [provider]  losartan-hydrochlorothiazide (HYZAAR) 50-12.5 MG tablet Take 1 tablet by mouth daily. 04/08/17  Yes Juline Patch, MD  metoprolol succinate (TOPROL-XL) 100 MG 24 hr tablet TAKE 1 TABLET DAILY. TAKE  WITH OR IMMEDIATELY        FOLLOWING A MEAL. 04/05/17  Yes Juline Patch, MD  sertraline (ZOLOFT) 50 MG tablet Take 1 tablet (50 mg total) by mouth daily. May increase to one and a half q day 02/25/17  Yes Juline Patch, MD  Na Sulfate-K Sulfate-Mg Sulf (SUPREP BOWEL PREP KIT) 17.5-3.13-1.6 GM/177ML SOLN Take 1 kit by mouth as directed. 07/13/17   Lucilla Lame, MD    Allergies as of 07/10/2017  . (No Known Allergies)    Family History  Problem Relation Age of Onset  . Dementia Mother   . Stroke Father     Social History   Socioeconomic History  . Marital status: Married    Spouse name: Not on file  . Number of  children: 2  . Years of education: Not on file  . Highest education level: Bachelor's degree (e.g., BA, AB, BS)  Occupational History  . Occupation: Retired  Scientific laboratory technician  . Financial resource strain: Not hard at all  . Food insecurity:    Worry: Never true    Inability: Never true  . Transportation needs:    Medical: No    Non-medical: No  Tobacco Use  . Smoking status: Former Smoker    Packs/day: 1.50    Years: 40.00    Pack years: 60.00    Types: Cigarettes    Last attempt to quit: 07/2015    Years since quitting: 1.9  . Smokeless tobacco: Never Used  . Tobacco comment: smoking cessation materials not required  Substance and Sexual Activity  . Alcohol use: Yes    Alcohol/week: 8.4 oz    Types: 14 Cans of beer per week  . Drug use: No  . Sexual activity: Never  Lifestyle  . Physical activity:    Days per week: 7 days    Minutes per session: 70 min  . Stress: Not at all  Relationships  . Social connections:    Talks on phone: Patient refused    Gets together: Patient refused    Attends religious service: Patient refused    Active member of  club or organization: Patient refused    Attends meetings of clubs or organizations: Patient refused    Relationship status: Married  . Intimate partner violence:    Fear of current or ex partner: No    Emotionally abused: No    Physically abused: No    Forced sexual activity: No  Other Topics Concern  . Not on file  Social History Narrative  . Not on file    Review of Systems: See HPI, otherwise negative ROS  Physical Exam: BP 114/70   Pulse 65   Temp (!) 97 F (36.1 C)   Ht 5' 8" (1.727 m)   Wt 161 lb 12.8 oz (73.4 kg)   SpO2 100%   BMI 24.60 kg/m  General:   Alert,  pleasant and cooperative in NAD Head:  Normocephalic and atraumatic. Neck:  Supple; no masses or thyromegaly. Lungs:  Clear throughout to auscultation.    Heart:  Regular rate and rhythm. Abdomen:  Soft, nontender and nondistended. Normal bowel  sounds, without guarding, and without rebound.   Neurologic:  Alert and  oriented x4;  grossly normal neurologically.  Impression/Plan: Travis Robertson is here for an colonoscopy to be performed for history of colon polyps  Risks, benefits, limitations, and alternatives regarding  colonoscopy have been reviewed with the patient.  Questions have been answered.  All parties agreeable.   Lucilla Lame, MD  07/15/2017, 9:55 AM

## 2017-07-15 NOTE — Op Note (Signed)
[facility] Patient Name: Travis Robertson Procedure Date: 07/15/2017 10:23 AM MRN: [id number] Account #: [id number] Date of Birth: Dec 06, 1950 Admit Type: Outpatient Age: 67 [facility] ROOM 01 Gender: Male Note Status: Finalized Procedure:            Colonoscopy Indications:          High risk colon cancer surveillance: Personal history                        of colonic polyps Providers:            Lucilla Lame MD, MD Referring MD:         Juline Patch, MD (Referring MD) Medicines:            Propofol per Anesthesia Complications:        No immediate complications. Procedure:            Pre-Anesthesia Assessment:                       - Prior to the procedure, a History and Physical was                        performed, and patient medications and allergies were                        reviewed. The patient's tolerance of previous                        anesthesia was also reviewed. The risks and benefits of                        the procedure and the sedation options and risks were                        discussed with the patient. All questions were                        answered, and informed consent was obtained. Prior                        Anticoagulants: The patient has taken no previous                        anticoagulant or antiplatelet agents. ASA Grade                        Assessment: II - A patient with mild systemic disease.                        After reviewing the risks and benefits, the patient was                        deemed in satisfactory condition to undergo the                        procedure.                       After obtaining informed consent, the colonoscope was  passed under direct vision. Throughout the procedure,                        the patient's blood pressure, pulse, and oxygen                        saturations were monitored continuously. The [place] [id number]) was introduced through the                        anus and advanced to the the cecum, identified by                        appendiceal orifice and ileocecal valve. The                        colonoscopy was performed without difficulty. The                        patient tolerated the procedure well. The quality of                        the bowel preparation was excellent. Findings:      The perianal and digital rectal examinations were normal.      A 2 mm polyp was found in the ascending colon. The polyp was removed       with a cold biopsy forceps. Resection and retrieval were complete.      Two sessile polyps were found in the sigmoid colon. The polyps were 4 to       5 mm in size. These polyps were removed with a cold biopsy forceps.       Resection and retrieval were complete.      Two sessile polyps were found in the rectum. The polyps were 2 to 3 mm       in size. These polyps were removed with a cold biopsy forceps. Resection       and retrieval were complete. Impression:           - One 6 mm polyp in the descending colon, removed with                        a cold snare. Resected and retrieved.                       - Two 4 to 5 mm polyps in the sigmoid colon, removed                        with a cold biopsy forceps. Resected and retrieved.                       - Two 2 to 3 mm polyps in the rectum, removed with a                        cold biopsy forceps. Resected and retrieved. Recommendation:       - Discharge patient to home.                       -  Resume previous diet.                       - Continue present medications.                       - Await pathology results.                       - Repeat colonoscopy in 5 years for surveillance. Procedure Code(s):    --- Professional ---                       [phone], Colonoscopy, flexible; with biopsy, single or                        multiple Diagnosis Code(s):    --- Professional ---                        Z86.010, Personal history of colonic polyps                       D12.4, Benign neoplasm of descending colon                       D12.5, Benign neoplasm of sigmoid colon                       K62.1, Rectal polyp CPT copyright 2016 American Medical Association. All rights reserved. The codes documented in this report are preliminary and upon coder review may  be revised to meet current compliance requirements. Lucilla Lame MD, MD 07/15/2017 10:50:50 AM This report has been signed electronically. Number of Addenda: 0 Note Initiated On: 07/15/2017 10:23 AM Scope Withdrawal Time: 0 hours 8 minutes 34 seconds  Total Procedure Duration: 0 hours 11 minutes 53 seconds       [facility]

## 2017-07-15 NOTE — Anesthesia Postprocedure Evaluation (Signed)
Anesthesia Post Note  Patient: Travis Robertson  Procedure(s) Performed: COLONOSCOPY WITH PROPOFOL (N/A ) POLYPECTOMY INTESTINAL  Patient location during evaluation: PACU Anesthesia Type: General Level of consciousness: awake and alert and oriented Pain management: satisfactory to patient Vital Signs Assessment: post-procedure vital signs reviewed and stable Respiratory status: spontaneous breathing, nonlabored ventilation and respiratory function stable Cardiovascular status: blood pressure returned to baseline and stable Postop Assessment: Adequate PO intake and No signs of nausea or vomiting Anesthetic complications: no    Raliegh Ip

## 2017-07-18 ENCOUNTER — Encounter: Payer: Self-pay | Admitting: Gastroenterology

## 2017-07-21 ENCOUNTER — Other Ambulatory Visit: Payer: Self-pay | Admitting: Family Medicine

## 2017-07-21 DIAGNOSIS — F419 Anxiety disorder, unspecified: Secondary | ICD-10-CM

## 2017-07-21 DIAGNOSIS — F330 Major depressive disorder, recurrent, mild: Secondary | ICD-10-CM

## 2017-07-21 DIAGNOSIS — G2581 Restless legs syndrome: Secondary | ICD-10-CM | POA: Diagnosis not present

## 2017-07-21 DIAGNOSIS — R270 Ataxia, unspecified: Secondary | ICD-10-CM | POA: Diagnosis not present

## 2017-07-21 DIAGNOSIS — R799 Abnormal finding of blood chemistry, unspecified: Secondary | ICD-10-CM | POA: Diagnosis not present

## 2017-07-22 ENCOUNTER — Encounter: Payer: Self-pay | Admitting: Gastroenterology

## 2017-07-25 DIAGNOSIS — R41841 Cognitive communication deficit: Secondary | ICD-10-CM | POA: Diagnosis not present

## 2017-07-25 DIAGNOSIS — G3185 Corticobasal degeneration: Secondary | ICD-10-CM | POA: Diagnosis not present

## 2017-07-25 DIAGNOSIS — R471 Dysarthria and anarthria: Secondary | ICD-10-CM | POA: Diagnosis not present

## 2017-07-25 DIAGNOSIS — R270 Ataxia, unspecified: Secondary | ICD-10-CM | POA: Diagnosis not present

## 2017-07-26 ENCOUNTER — Ambulatory Visit: Payer: Medicare Other | Admitting: Oncology

## 2017-07-26 DIAGNOSIS — R799 Abnormal finding of blood chemistry, unspecified: Secondary | ICD-10-CM | POA: Diagnosis not present

## 2017-07-26 DIAGNOSIS — R41841 Cognitive communication deficit: Secondary | ICD-10-CM | POA: Diagnosis not present

## 2017-07-26 DIAGNOSIS — R471 Dysarthria and anarthria: Secondary | ICD-10-CM | POA: Diagnosis not present

## 2017-07-26 DIAGNOSIS — G2581 Restless legs syndrome: Secondary | ICD-10-CM | POA: Diagnosis not present

## 2017-07-26 DIAGNOSIS — G3185 Corticobasal degeneration: Secondary | ICD-10-CM | POA: Diagnosis not present

## 2017-07-26 DIAGNOSIS — R270 Ataxia, unspecified: Secondary | ICD-10-CM | POA: Diagnosis not present

## 2017-08-03 DIAGNOSIS — R471 Dysarthria and anarthria: Secondary | ICD-10-CM | POA: Diagnosis not present

## 2017-08-03 DIAGNOSIS — R41841 Cognitive communication deficit: Secondary | ICD-10-CM | POA: Diagnosis not present

## 2017-08-03 DIAGNOSIS — G3185 Corticobasal degeneration: Secondary | ICD-10-CM | POA: Diagnosis not present

## 2017-08-03 DIAGNOSIS — R270 Ataxia, unspecified: Secondary | ICD-10-CM | POA: Diagnosis not present

## 2017-08-03 DIAGNOSIS — E041 Nontoxic single thyroid nodule: Secondary | ICD-10-CM | POA: Diagnosis not present

## 2017-08-04 DIAGNOSIS — S42001D Fracture of unspecified part of right clavicle, subsequent encounter for fracture with routine healing: Secondary | ICD-10-CM | POA: Diagnosis not present

## 2017-08-15 DIAGNOSIS — E042 Nontoxic multinodular goiter: Secondary | ICD-10-CM | POA: Diagnosis not present

## 2017-08-15 DIAGNOSIS — E041 Nontoxic single thyroid nodule: Secondary | ICD-10-CM | POA: Diagnosis not present

## 2017-08-16 DIAGNOSIS — S42031D Displaced fracture of lateral end of right clavicle, subsequent encounter for fracture with routine healing: Secondary | ICD-10-CM | POA: Diagnosis not present

## 2017-08-16 DIAGNOSIS — D480 Neoplasm of uncertain behavior of bone and articular cartilage: Secondary | ICD-10-CM | POA: Diagnosis not present

## 2017-08-16 DIAGNOSIS — S42123A Displaced fracture of acromial process, unspecified shoulder, initial encounter for closed fracture: Secondary | ICD-10-CM | POA: Diagnosis not present

## 2017-08-16 DIAGNOSIS — Z87891 Personal history of nicotine dependence: Secondary | ICD-10-CM | POA: Diagnosis not present

## 2017-08-16 DIAGNOSIS — Y33XXXA Other specified events, undetermined intent, initial encounter: Secondary | ICD-10-CM | POA: Diagnosis not present

## 2017-08-16 DIAGNOSIS — S42031A Displaced fracture of lateral end of right clavicle, initial encounter for closed fracture: Secondary | ICD-10-CM | POA: Diagnosis not present

## 2017-08-16 DIAGNOSIS — S42121D Displaced fracture of acromial process, right shoulder, subsequent encounter for fracture with routine healing: Secondary | ICD-10-CM | POA: Diagnosis not present

## 2017-08-16 DIAGNOSIS — S2249XD Multiple fractures of ribs, unspecified side, subsequent encounter for fracture with routine healing: Secondary | ICD-10-CM | POA: Diagnosis not present

## 2017-08-17 DIAGNOSIS — R270 Ataxia, unspecified: Secondary | ICD-10-CM | POA: Diagnosis not present

## 2017-08-17 DIAGNOSIS — R41841 Cognitive communication deficit: Secondary | ICD-10-CM | POA: Diagnosis not present

## 2017-08-17 DIAGNOSIS — R471 Dysarthria and anarthria: Secondary | ICD-10-CM | POA: Diagnosis not present

## 2017-08-17 DIAGNOSIS — G3185 Corticobasal degeneration: Secondary | ICD-10-CM | POA: Diagnosis not present

## 2017-08-24 DIAGNOSIS — C73 Malignant neoplasm of thyroid gland: Secondary | ICD-10-CM | POA: Insufficient documentation

## 2017-08-26 DIAGNOSIS — R471 Dysarthria and anarthria: Secondary | ICD-10-CM | POA: Diagnosis not present

## 2017-08-26 DIAGNOSIS — G3185 Corticobasal degeneration: Secondary | ICD-10-CM | POA: Diagnosis not present

## 2017-08-26 DIAGNOSIS — R269 Unspecified abnormalities of gait and mobility: Secondary | ICD-10-CM | POA: Diagnosis not present

## 2017-08-26 DIAGNOSIS — R270 Ataxia, unspecified: Secondary | ICD-10-CM | POA: Diagnosis not present

## 2017-08-26 DIAGNOSIS — R41841 Cognitive communication deficit: Secondary | ICD-10-CM | POA: Diagnosis not present

## 2017-08-26 DIAGNOSIS — R262 Difficulty in walking, not elsewhere classified: Secondary | ICD-10-CM | POA: Diagnosis not present

## 2017-09-01 DIAGNOSIS — E042 Nontoxic multinodular goiter: Secondary | ICD-10-CM | POA: Diagnosis not present

## 2017-09-02 DIAGNOSIS — G3185 Corticobasal degeneration: Secondary | ICD-10-CM | POA: Diagnosis not present

## 2017-09-02 DIAGNOSIS — R270 Ataxia, unspecified: Secondary | ICD-10-CM | POA: Diagnosis not present

## 2017-09-02 DIAGNOSIS — R41841 Cognitive communication deficit: Secondary | ICD-10-CM | POA: Diagnosis not present

## 2017-09-02 DIAGNOSIS — R471 Dysarthria and anarthria: Secondary | ICD-10-CM | POA: Diagnosis not present

## 2017-09-02 DIAGNOSIS — R269 Unspecified abnormalities of gait and mobility: Secondary | ICD-10-CM | POA: Diagnosis not present

## 2017-09-02 DIAGNOSIS — R262 Difficulty in walking, not elsewhere classified: Secondary | ICD-10-CM | POA: Diagnosis not present

## 2017-09-09 DIAGNOSIS — C73 Malignant neoplasm of thyroid gland: Secondary | ICD-10-CM | POA: Diagnosis not present

## 2017-09-09 DIAGNOSIS — G3185 Corticobasal degeneration: Secondary | ICD-10-CM | POA: Diagnosis not present

## 2017-09-09 DIAGNOSIS — R41841 Cognitive communication deficit: Secondary | ICD-10-CM | POA: Diagnosis not present

## 2017-09-09 DIAGNOSIS — R270 Ataxia, unspecified: Secondary | ICD-10-CM | POA: Diagnosis not present

## 2017-09-09 DIAGNOSIS — R269 Unspecified abnormalities of gait and mobility: Secondary | ICD-10-CM | POA: Diagnosis not present

## 2017-09-09 DIAGNOSIS — R262 Difficulty in walking, not elsewhere classified: Secondary | ICD-10-CM | POA: Diagnosis not present

## 2017-09-09 DIAGNOSIS — R471 Dysarthria and anarthria: Secondary | ICD-10-CM | POA: Diagnosis not present

## 2017-09-14 DIAGNOSIS — I10 Essential (primary) hypertension: Secondary | ICD-10-CM | POA: Diagnosis not present

## 2017-09-14 DIAGNOSIS — E7800 Pure hypercholesterolemia, unspecified: Secondary | ICD-10-CM | POA: Diagnosis not present

## 2017-09-14 DIAGNOSIS — Z6827 Body mass index (BMI) 27.0-27.9, adult: Secondary | ICD-10-CM | POA: Diagnosis not present

## 2017-09-14 DIAGNOSIS — Z87891 Personal history of nicotine dependence: Secondary | ICD-10-CM | POA: Diagnosis not present

## 2017-09-14 DIAGNOSIS — Z7982 Long term (current) use of aspirin: Secondary | ICD-10-CM | POA: Diagnosis not present

## 2017-09-14 DIAGNOSIS — F419 Anxiety disorder, unspecified: Secondary | ICD-10-CM | POA: Diagnosis not present

## 2017-09-14 DIAGNOSIS — C73 Malignant neoplasm of thyroid gland: Secondary | ICD-10-CM | POA: Diagnosis not present

## 2017-09-14 DIAGNOSIS — Z79899 Other long term (current) drug therapy: Secondary | ICD-10-CM | POA: Diagnosis not present

## 2017-09-16 DIAGNOSIS — G3185 Corticobasal degeneration: Secondary | ICD-10-CM | POA: Diagnosis not present

## 2017-09-16 DIAGNOSIS — R262 Difficulty in walking, not elsewhere classified: Secondary | ICD-10-CM | POA: Diagnosis not present

## 2017-09-16 DIAGNOSIS — R471 Dysarthria and anarthria: Secondary | ICD-10-CM | POA: Diagnosis not present

## 2017-09-16 DIAGNOSIS — R270 Ataxia, unspecified: Secondary | ICD-10-CM | POA: Diagnosis not present

## 2017-09-16 DIAGNOSIS — R269 Unspecified abnormalities of gait and mobility: Secondary | ICD-10-CM | POA: Diagnosis not present

## 2017-09-16 DIAGNOSIS — R41841 Cognitive communication deficit: Secondary | ICD-10-CM | POA: Diagnosis not present

## 2017-09-23 DIAGNOSIS — R269 Unspecified abnormalities of gait and mobility: Secondary | ICD-10-CM | POA: Diagnosis not present

## 2017-09-23 DIAGNOSIS — R471 Dysarthria and anarthria: Secondary | ICD-10-CM | POA: Diagnosis not present

## 2017-09-23 DIAGNOSIS — R262 Difficulty in walking, not elsewhere classified: Secondary | ICD-10-CM | POA: Diagnosis not present

## 2017-09-23 DIAGNOSIS — R270 Ataxia, unspecified: Secondary | ICD-10-CM | POA: Diagnosis not present

## 2017-09-23 DIAGNOSIS — G3185 Corticobasal degeneration: Secondary | ICD-10-CM | POA: Diagnosis not present

## 2017-09-23 DIAGNOSIS — R41841 Cognitive communication deficit: Secondary | ICD-10-CM | POA: Diagnosis not present

## 2017-09-30 DIAGNOSIS — R41841 Cognitive communication deficit: Secondary | ICD-10-CM | POA: Diagnosis not present

## 2017-09-30 DIAGNOSIS — R471 Dysarthria and anarthria: Secondary | ICD-10-CM | POA: Diagnosis not present

## 2017-09-30 DIAGNOSIS — R270 Ataxia, unspecified: Secondary | ICD-10-CM | POA: Diagnosis not present

## 2017-09-30 DIAGNOSIS — G3185 Corticobasal degeneration: Secondary | ICD-10-CM | POA: Diagnosis not present

## 2017-09-30 DIAGNOSIS — R262 Difficulty in walking, not elsewhere classified: Secondary | ICD-10-CM | POA: Diagnosis not present

## 2017-10-03 DIAGNOSIS — G3185 Corticobasal degeneration: Secondary | ICD-10-CM | POA: Diagnosis not present

## 2017-10-03 DIAGNOSIS — R471 Dysarthria and anarthria: Secondary | ICD-10-CM | POA: Diagnosis not present

## 2017-10-03 DIAGNOSIS — R262 Difficulty in walking, not elsewhere classified: Secondary | ICD-10-CM | POA: Diagnosis not present

## 2017-10-03 DIAGNOSIS — R270 Ataxia, unspecified: Secondary | ICD-10-CM | POA: Diagnosis not present

## 2017-10-03 DIAGNOSIS — R41841 Cognitive communication deficit: Secondary | ICD-10-CM | POA: Diagnosis not present

## 2017-10-04 ENCOUNTER — Other Ambulatory Visit: Payer: Self-pay | Admitting: Family Medicine

## 2017-10-04 DIAGNOSIS — I10 Essential (primary) hypertension: Secondary | ICD-10-CM

## 2017-10-04 DIAGNOSIS — E785 Hyperlipidemia, unspecified: Secondary | ICD-10-CM

## 2017-10-18 ENCOUNTER — Other Ambulatory Visit: Payer: Self-pay | Admitting: Family Medicine

## 2017-10-18 DIAGNOSIS — F419 Anxiety disorder, unspecified: Secondary | ICD-10-CM

## 2017-10-18 DIAGNOSIS — F330 Major depressive disorder, recurrent, mild: Secondary | ICD-10-CM

## 2017-10-20 DIAGNOSIS — R262 Difficulty in walking, not elsewhere classified: Secondary | ICD-10-CM | POA: Diagnosis not present

## 2017-10-20 DIAGNOSIS — R471 Dysarthria and anarthria: Secondary | ICD-10-CM | POA: Diagnosis not present

## 2017-10-20 DIAGNOSIS — R270 Ataxia, unspecified: Secondary | ICD-10-CM | POA: Diagnosis not present

## 2017-10-20 DIAGNOSIS — G3185 Corticobasal degeneration: Secondary | ICD-10-CM | POA: Diagnosis not present

## 2017-10-20 DIAGNOSIS — R41841 Cognitive communication deficit: Secondary | ICD-10-CM | POA: Diagnosis not present

## 2017-11-02 DIAGNOSIS — Z1283 Encounter for screening for malignant neoplasm of skin: Secondary | ICD-10-CM | POA: Diagnosis not present

## 2017-11-02 DIAGNOSIS — Z859 Personal history of malignant neoplasm, unspecified: Secondary | ICD-10-CM | POA: Diagnosis not present

## 2017-11-02 DIAGNOSIS — Z872 Personal history of diseases of the skin and subcutaneous tissue: Secondary | ICD-10-CM | POA: Diagnosis not present

## 2017-11-02 DIAGNOSIS — L821 Other seborrheic keratosis: Secondary | ICD-10-CM | POA: Diagnosis not present

## 2017-11-02 DIAGNOSIS — L570 Actinic keratosis: Secondary | ICD-10-CM | POA: Diagnosis not present

## 2017-11-02 DIAGNOSIS — L578 Other skin changes due to chronic exposure to nonionizing radiation: Secondary | ICD-10-CM | POA: Diagnosis not present

## 2017-11-11 DIAGNOSIS — I10 Essential (primary) hypertension: Secondary | ICD-10-CM | POA: Diagnosis not present

## 2017-11-11 DIAGNOSIS — Z87891 Personal history of nicotine dependence: Secondary | ICD-10-CM | POA: Diagnosis not present

## 2017-11-11 DIAGNOSIS — E042 Nontoxic multinodular goiter: Secondary | ICD-10-CM | POA: Diagnosis not present

## 2017-11-11 DIAGNOSIS — C73 Malignant neoplasm of thyroid gland: Secondary | ICD-10-CM | POA: Diagnosis not present

## 2017-11-11 DIAGNOSIS — E7800 Pure hypercholesterolemia, unspecified: Secondary | ICD-10-CM | POA: Diagnosis not present

## 2017-11-11 DIAGNOSIS — Z79899 Other long term (current) drug therapy: Secondary | ICD-10-CM | POA: Diagnosis not present

## 2017-11-11 DIAGNOSIS — Z7982 Long term (current) use of aspirin: Secondary | ICD-10-CM | POA: Diagnosis not present

## 2017-11-11 DIAGNOSIS — F32A Depression, unspecified: Secondary | ICD-10-CM | POA: Insufficient documentation

## 2017-11-11 DIAGNOSIS — R5383 Other fatigue: Secondary | ICD-10-CM | POA: Diagnosis not present

## 2017-11-23 DIAGNOSIS — Z9089 Acquired absence of other organs: Secondary | ICD-10-CM | POA: Diagnosis not present

## 2017-11-23 DIAGNOSIS — Z08 Encounter for follow-up examination after completed treatment for malignant neoplasm: Secondary | ICD-10-CM | POA: Diagnosis not present

## 2017-11-23 DIAGNOSIS — Z85850 Personal history of malignant neoplasm of thyroid: Secondary | ICD-10-CM | POA: Diagnosis not present

## 2017-12-16 DIAGNOSIS — C73 Malignant neoplasm of thyroid gland: Secondary | ICD-10-CM | POA: Diagnosis not present

## 2017-12-21 DIAGNOSIS — C73 Malignant neoplasm of thyroid gland: Secondary | ICD-10-CM | POA: Diagnosis not present

## 2018-01-03 DIAGNOSIS — L570 Actinic keratosis: Secondary | ICD-10-CM | POA: Diagnosis not present

## 2018-01-08 ENCOUNTER — Other Ambulatory Visit: Payer: Self-pay | Admitting: Family Medicine

## 2018-01-08 DIAGNOSIS — F419 Anxiety disorder, unspecified: Secondary | ICD-10-CM

## 2018-01-08 DIAGNOSIS — E785 Hyperlipidemia, unspecified: Secondary | ICD-10-CM

## 2018-01-08 DIAGNOSIS — F330 Major depressive disorder, recurrent, mild: Secondary | ICD-10-CM

## 2018-01-08 DIAGNOSIS — I10 Essential (primary) hypertension: Secondary | ICD-10-CM

## 2018-01-12 ENCOUNTER — Other Ambulatory Visit: Payer: Self-pay

## 2018-02-02 DIAGNOSIS — C73 Malignant neoplasm of thyroid gland: Secondary | ICD-10-CM | POA: Diagnosis not present

## 2018-02-14 ENCOUNTER — Ambulatory Visit (INDEPENDENT_AMBULATORY_CARE_PROVIDER_SITE_OTHER): Payer: Medicare Other | Admitting: Family Medicine

## 2018-02-14 ENCOUNTER — Encounter: Payer: Self-pay | Admitting: Family Medicine

## 2018-02-14 VITALS — BP 120/64 | HR 64 | Ht 68.0 in | Wt 161.0 lb

## 2018-02-14 DIAGNOSIS — Z23 Encounter for immunization: Secondary | ICD-10-CM

## 2018-02-14 DIAGNOSIS — I2510 Atherosclerotic heart disease of native coronary artery without angina pectoris: Secondary | ICD-10-CM

## 2018-02-14 DIAGNOSIS — I10 Essential (primary) hypertension: Secondary | ICD-10-CM

## 2018-02-14 DIAGNOSIS — F330 Major depressive disorder, recurrent, mild: Secondary | ICD-10-CM | POA: Diagnosis not present

## 2018-02-14 DIAGNOSIS — F419 Anxiety disorder, unspecified: Secondary | ICD-10-CM | POA: Diagnosis not present

## 2018-02-14 DIAGNOSIS — F329 Major depressive disorder, single episode, unspecified: Secondary | ICD-10-CM

## 2018-02-14 DIAGNOSIS — E785 Hyperlipidemia, unspecified: Secondary | ICD-10-CM | POA: Diagnosis not present

## 2018-02-14 MED ORDER — SERTRALINE HCL 50 MG PO TABS: 50.0000 mg | ORAL_TABLET | Freq: Every day | ORAL | 1 refills | Status: DC

## 2018-02-14 MED ORDER — LOSARTAN POTASSIUM-HCTZ 50-12.5 MG PO TABS: 1.0000 | ORAL_TABLET | Freq: Every day | ORAL | 1 refills | Status: DC

## 2018-02-14 MED ORDER — ATORVASTATIN CALCIUM 10 MG PO TABS: 10.0000 mg | ORAL_TABLET | Freq: Every day | ORAL | 1 refills | Status: DC

## 2018-02-14 MED ORDER — METOPROLOL SUCCINATE ER 100 MG PO TB24: ORAL_TABLET | ORAL | 1 refills | Status: DC

## 2018-02-14 NOTE — Progress Notes (Signed)
Date:  02/14/2018   Name:  Travis Robertson   DOB:  [birth year]   MRN:  [id number]   Chief Complaint: Depression (PHQ=6); Hypertension; Hyperlipidemia; and Flu Vaccine Depression         This is a chronic problem.  The current episode started more than 1 year ago.   The onset quality is gradual.   The problem occurs intermittently.  The problem has been waxing and waning since onset.  Associated symptoms include no decreased concentration, no fatigue, no helplessness, no hopelessness, does not have insomnia, not irritable, no restlessness, no decreased interest, no appetite change, no body aches, no myalgias, no headaches, no indigestion, not sad and no suicidal ideas.     The symptoms are aggravated by nothing.  Past treatments include SSRIs - Selective serotonin reuptake inhibitors.  Compliance with treatment is good.  Previous treatment provided mild relief.   Pertinent negatives include no anxiety. Hypertension  This is a chronic problem. The current episode started more than 1 year ago. The problem is unchanged. The problem is controlled. Pertinent negatives include no anxiety, blurred vision, chest pain, headaches, malaise/fatigue, neck pain, orthopnea, palpitations, peripheral edema, PND, shortness of breath or sweats. There are no associated agents to hypertension. Risk factors for coronary artery disease include dyslipidemia. Past treatments include ACE inhibitors, beta blockers and diuretics. The current treatment provides moderate improvement. There are no compliance problems.  There is no history of angina, kidney disease, CVA, heart failure, left ventricular hypertrophy, PVD or retinopathy. There is no history of chronic renal disease, a hypertension causing med or renovascular disease.  Hyperlipidemia  This is a chronic problem. The current episode started more than 1 year ago. The problem is controlled. Recent lipid tests were reviewed and are normal. He has no history of chronic  renal disease. Factors aggravating his hyperlipidemia include thiazides. Pertinent negatives include no chest pain, focal sensory loss, focal weakness, leg pain, myalgias or shortness of breath. Current antihyperlipidemic treatment includes statins. The current treatment provides moderate improvement of lipids. There are no compliance problems.  Risk factors for coronary artery disease include dyslipidemia and hypertension.     Review of Systems  Constitutional: Negative for appetite change, chills, fatigue, fever, malaise/fatigue and unexpected weight change.  HENT: Negative for drooling, ear discharge, ear pain, facial swelling, hearing loss, nosebleeds, postnasal drip, sneezing, sore throat and trouble swallowing.   Eyes: Negative for blurred vision, photophobia, pain, discharge, redness, itching and visual disturbance.  Respiratory: Negative for cough, choking, chest tightness, shortness of breath and wheezing.   Cardiovascular: Negative for chest pain, palpitations, orthopnea, leg swelling and PND.  Gastrointestinal: Negative for abdominal pain, blood in stool, constipation, diarrhea, nausea, rectal pain and vomiting.  Endocrine: Negative for cold intolerance, heat intolerance, polydipsia, polyphagia and polyuria.  Genitourinary: Negative for decreased urine volume, difficulty urinating, discharge, dysuria, flank pain, frequency, hematuria, penile pain, penile swelling, scrotal swelling, testicular pain and urgency.  Musculoskeletal: Negative for back pain, joint swelling, myalgias, neck pain and neck stiffness.  Skin: Negative for color change and rash.  Allergic/Immunologic: Negative for environmental allergies and immunocompromised state.  Neurological: Negative for dizziness, tremors, focal weakness, seizures, syncope, speech difficulty, weakness, light-headedness, numbness and headaches.  Hematological: Does not bruise/bleed easily.  Psychiatric/Behavioral: Positive for depression.  Negative for agitation, behavioral problems, confusion, decreased concentration, dysphoric mood, hallucinations, self-injury and suicidal ideas. The patient is not nervous/anxious and does not have insomnia.     Patient Active Problem List   Diagnosis  Date Noted  . Personal history of colonic polyps   . Benign neoplasm of descending colon   . Polyp of sigmoid colon   . Rectal polyp   . Mild episode of recurrent major depressive disorder ([place]) 07/14/2017  . Essential hypertension 12/31/2014  . Hyperlipidemia 12/31/2014  . Taking multiple medications for chronic disease 12/31/2014    No Known Allergies  Past Surgical History:  Procedure Laterality Date  . APPENDECTOMY    . CATARACT EXTRACTION Bilateral   . COLONOSCOPY  01/24/2012   cleared for 5 yrs- [facility]  . COLONOSCOPY WITH PROPOFOL N/A 07/15/2017   Procedure: COLONOSCOPY WITH PROPOFOL;  Surgeon: Travis Lame, MD;  Location: [facility];  Service: Endoscopy;  Laterality: N/A;  . POLYPECTOMY  07/15/2017   Procedure: POLYPECTOMY INTESTINAL;  Surgeon: Travis Lame, MD;  Location: [facility];  Service: Endoscopy;;  Ascending colon polyp Sigmoid colon polyp x 2 Rectal polyp x 2  . RETINAL DETACHMENT SURGERY Bilateral     Social History   Tobacco Use  . Smoking status: Former Smoker    Packs/day: 1.50    Years: 40.00    Pack years: 60.00    Types: Cigarettes    Last attempt to quit: 07/2015    Years since quitting: 2.5  . Smokeless tobacco: Never Used  . Tobacco comment: smoking cessation materials not required  Substance Use Topics  . Alcohol use: Yes    Alcohol/week: 14.0 standard drinks    Types: 14 Cans of beer per week  . Drug use: No     Medication list has been reviewed and updated.  Current Meds  Medication Sig  . aspirin 81 MG tablet Take 81 mg by mouth daily.  Marland Kitchen atorvastatin (LIPITOR) 10 MG tablet TAKE 1 TABLET DAILY  . carbidopa-levodopa (SINEMET) 25-100 MG tablet Take 3 tablets by  mouth 3 (three) times daily.  Marland Kitchen levothyroxine (SYNTHROID, LEVOTHROID) 100 MCG tablet Take 100 mcg by mouth daily. Dr Travis Robertson  . losartan-hydrochlorothiazide (HYZAAR) 50-12.5 MG tablet TAKE 1 TABLET DAILY  . metoprolol succinate (TOPROL-XL) 100 MG 24 hr tablet TAKE 1 TABLET DAILY. TAKE  WITH OR IMMEDIATELY        FOLLOWING A MEAL.  Marland Kitchen sertraline (ZOLOFT) 50 MG tablet Take 1 tablet (50 mg total) by mouth daily. May increase to one and a half q day    PHQ 2/9 Scores 02/14/2018 07/04/2017 07/04/2017 02/25/2017  PHQ - 2 Score 0 0 0 1  PHQ- 9 Score 6 0 - 8    Physical Exam  Constitutional: He is oriented to person, place, and time. He is not irritable.  HENT:  Head: Normocephalic.  Right Ear: External ear normal.  Left Ear: External ear normal.  Nose: Nose normal.  Mouth/Throat: Oropharynx is clear and moist.  Eyes: Pupils are equal, round, and reactive to light. Conjunctivae and EOM are normal. Right eye exhibits no discharge. Left eye exhibits no discharge. No scleral icterus.  Neck: Normal range of motion. Neck supple. No JVD present. No tracheal deviation present. No thyromegaly present.  Cardiovascular: Normal rate, regular rhythm, normal heart sounds and intact distal pulses. Exam reveals no gallop and no friction rub.  No murmur heard. Pulmonary/Chest: Breath sounds normal. No respiratory distress. He has no wheezes. He has no rales.  Abdominal: Soft. Bowel sounds are normal. He exhibits no mass. There is no hepatosplenomegaly. There is no tenderness. There is no rebound, no guarding and no CVA tenderness.  Musculoskeletal: Normal range of motion.  He exhibits no edema or tenderness.  Lymphadenopathy:    He has no cervical adenopathy.  Neurological: He is alert and oriented to person, place, and time. He has normal strength and normal reflexes. No cranial nerve deficit.  Skin: Skin is warm. No rash noted.  Nursing note and vitals reviewed.   BP 120/64   Pulse 64   Ht 5\' 8"  (1.727 m)    Wt 161 lb (73 kg)   BMI 24.48 kg/m   Assessment and Plan:  1. Essential hypertension Stable on med- refill metoprolol and losartan- draw renal panel - metoprolol succinate (TOPROL-XL) 100 MG 24 hr tablet; TAKE 1 TABLET DAILY. TAKE  WITH OR IMMEDIATELY        FOLLOWING A MEAL.  Dispense: 90 tablet; Refill: 1 - losartan-hydrochlorothiazide (HYZAAR) 50-12.5 MG tablet; Take 1 tablet by mouth daily.  Dispense: 90 tablet; Refill: 1 - Renal Function Panel  2. Hyperlipidemia, unspecified hyperlipidemia type Stable on med- refill Lipitor - atorvastatin (LIPITOR) 10 MG tablet; Take 1 tablet (10 mg total) by mouth daily.  Dispense: 90 tablet; Refill: 1    3. Anxiety Stable on med- refill sertraline - sertraline (ZOLOFT) 50 MG tablet; Take 1 tablet (50 mg total) by mouth daily. May increase to one and a half q day  Dispense: 90 tablet; Refill: 1  4. Mild episode of recurrent major depressive disorder (HCC) Stable on med- refill sertraline - sertraline (ZOLOFT) 50 MG tablet; Take 1 tablet (50 mg total) by mouth daily. May increase to one and a half q day  Dispense: 90 tablet; Refill: 1  5. Flu vaccine need administered - Flu vaccine HIGH DOSE PF (Fluzone High dose)   Dr. [facility]  02/14/2018

## 2018-02-15 LAB — RENAL FUNCTION PANEL
ALBUMIN: 4.2 g/dL (ref 3.6–4.8)
BUN/Creatinine Ratio: 18 (ref 10–24)
BUN: 18 mg/dL (ref 8–27)
CO2: 23 mmol/L (ref 20–29)
CREATININE: 0.98 mg/dL (ref 0.76–1.27)
Calcium: 9.1 mg/dL (ref 8.6–10.2)
Chloride: 98 mmol/L (ref 96–106)
GFR calc Af Amer: 92 mL/min/{1.73_m2} (ref >59)
GFR, EST NON AFRICAN AMERICAN: 79 mL/min/{1.73_m2} (ref >59)
Glucose: 97 mg/dL (ref 65–99)
PHOSPHORUS: 4.3 mg/dL (ref 2.5–4.5)
Potassium: 4.4 mmol/L (ref 3.5–5.2)
Sodium: 138 mmol/L (ref 134–144)

## 2018-02-20 ENCOUNTER — Encounter: Payer: Self-pay | Admitting: Physical Therapy

## 2018-02-20 ENCOUNTER — Ambulatory Visit: Payer: Medicare Other | Attending: Neurology | Admitting: Physical Therapy

## 2018-02-20 DIAGNOSIS — R2689 Other abnormalities of gait and mobility: Secondary | ICD-10-CM | POA: Insufficient documentation

## 2018-02-20 DIAGNOSIS — R269 Unspecified abnormalities of gait and mobility: Secondary | ICD-10-CM | POA: Diagnosis not present

## 2018-02-20 DIAGNOSIS — R2681 Unsteadiness on feet: Secondary | ICD-10-CM | POA: Diagnosis not present

## 2018-02-20 NOTE — Patient Instructions (Signed)
Access Code: [email or web address]  URL: [email or web address]  Date: 02/20/2018  Prepared by: Eugenio Saenz Nation   Exercises  Tandem Walking - 10 reps - 3 sets - 1x daily - 7x weekly  Single Leg Stance - 10 reps - 3 sets - 1x daily - 7x weekly  Heel Raise - 10 reps - 3 sets - 1x daily - 7x weekly

## 2018-02-20 NOTE — Therapy (Signed)
[facility] [address] [place], [place], [address] Phone: [phone]   Fax:  [phone]  Physical Therapy Evaluation  Patient Details  Name: Travis Robertson MRN: [id number] Date of Birth: 1950/06/04 No data recorded  Encounter Date: 02/20/2018  PT End of Session - 02/20/18 1449    Visit Number  1    Number of Visits  4    Date for PT Re-Evaluation  03/20/18    PT Start Time  1300    PT Stop Time  1432    PT Time Calculation (min)  92 min    Equipment Utilized During Treatment  Gait belt;Other (comment)   SPC, Rolling Walker, Rollator   Activity Tolerance  Patient tolerated treatment well    Behavior During Therapy  WFL for tasks assessed/performed       Past Medical History:  Diagnosis Date  . Arthritis    hands  . Corticobasal syndrome ([facility])   . Hyperlipidemia   . Hypertension   . Impingement syndrome of shoulder     Past Surgical History:  Procedure Laterality Date  . APPENDECTOMY    . CATARACT EXTRACTION Bilateral   . COLONOSCOPY  01/24/2012   cleared for 5 yrs- [facility]  . COLONOSCOPY WITH PROPOFOL N/A 07/15/2017   Procedure: COLONOSCOPY WITH PROPOFOL;  Surgeon: Lucilla Lame, MD;  Location: [place];  Service: Endoscopy;  Laterality: N/A;  . POLYPECTOMY  07/15/2017   Procedure: POLYPECTOMY INTESTINAL;  Surgeon: Lucilla Lame, MD;  Location: [place];  Service: Endoscopy;;  Ascending colon polyp Sigmoid colon polyp x 2 Rectal polyp x 2  . RETINAL DETACHMENT SURGERY Bilateral     There were no vitals filed for this visit.   Subjective Assessment - 02/20/18 1459    Subjective  Pt. reports he broke his ankle in December of 2018, and collarbone in January, 2019 from falls.  Pt. reports he has fallen 2-3 times in past 6 months.  Pt. has been seen in clinic for same condition, however Parkinson's symptoms have progressed since last visit and pt. would like to learn to use a form of  assistive device, specifically a walker. Pt. notes he was walking 5-6 miles a day when at this clinic 1.5 years ago, and has not been walking at all since about 3 months ago.  Pt. goals are centered around being more functionally able to walk around community.  Pt. notes he was diagnosed with thyroid cancer since last visit as well.     Patient is accompained by:  Family member    Pertinent History  Known to clinic, diagnosed with thyroid cancer since last visit to clinic, progression of Parkinsonism sx's.    Limitations  Walking    Patient Stated Goals  Increase balance/ gait endurance.      Currently in Pain?  No/denies    Multiple Pain Sites  No        SUBJECTIVE Chief complaint:  Balance/Gait with assistive device Onset: One year ago, February 23, 2018 Imaging: None Recent changes in overall health/medication: No Directional pattern for falls: Forward Prior history of physical therapy for balance: Yes Follow-up appointment with MD: Neurologist appointment in December.   Pt. reports he broke his ankle in December of 2018, and collarbone in January, 2019 from falls.  Pt. reports he has fallen 2-3 times in past 6 months.  Pt. has been seen in clinic for same condition, however Parkinson's symptoms have progressed since last visit  and pt. would like to learn to use a form of assistive device, specifically a walker. Pt. notes he was walking 5-6 miles a day when at this clinic 1.5 years ago, and has not been walking at all since about 3 months ago.  Pt. goals are centered around being more functionally able to walk around community.  Pt. notes he was diagnosed with thyroid cancer since last visit as well.     OBJECTIVE  MUSCULOSKELETAL: Tremor: Absent Bulk: Normal Tone: Normal, no clonus  Posture No gross abnormalities noted in standing or seated posture  Gait Shuffling of gait when walking from waiting room into the hallway.  Strength R/L 5/5 Hip flexion 5/5 Hip external  rotation 5/5 Hip internal rotation 5/5 Hip extension  5/5 Hip abduction 5/5 Hip adduction 5/5 Knee extension 5/5 Knee flexion 5/5 Ankle Plantarflexion 5/5 Ankle Dorsiflexion   NEUROLOGICAL:  Mental Status Patient is oriented to person, place and time.  Recent memory is intact.  Remote memory is intact.  Attention span and concentration are intact.  Expressive speech is intact.  Patient's fund of knowledge is within normal limits for educational level.  Cranial Nerves Visual acuity and visual fields are intact  Extraocular muscles are intact  Facial sensation is intact bilaterally  Facial strength is intact bilaterally  Hearing is normal as tested by gross conversation Palate elevates midline, normal phonation  Shoulder shrug strength is intact  Tongue protrudes midline    Reflexes R/L 2+/2+ Knee Jerk (L3/4) 2+/2+ Ankle Jerk (S1/2)  Coordination/Cerebellar Finger to Nose: Abnormal on Right Heel to Shin: Abnormal on Right Rapid alternating movements: Abnormal on Right Finger Opposition: Abnormal on Right   FUNCTIONAL OUTCOME MEASURES   Results Comments  BERG 51/56 Decrease from 53 on 10/26/16  DGI 19/24 Predictive of falls in eldery  TUG 12.57 seconds Decrease from 8.61 sec on 10/26/16  5TSTS 18.28 seconds Decrease from 12.93 sec on 10/26/16       ASSESSMENT Clinical Impression: Pt is a pleasant 67 year-old male/male referred for difficulty with balance. PT examination reveals deficits of festinating gait pattern and difficulty with turns. Pt presents with deficits in gait and balance as evidenced by the decline in his scores on the Berg, TUG, and 5TSTS, as well as a borderline score for DGI.  Patient will benefit from skilled PT services to address deficits in balance, learn appropriate assistive device choices and utilization, and decrease risk of falls to improve overall QOL.  Low (stable): no personal factors/comorbidities, 1-2 body systems/activity  limitations/participation restrictions     Objective measurements completed on examination: See above findings.     PT Long Term Goals - 02/20/18 1506      PT LONG TERM GOAL #1   Title  Pt will improve BERG by at least 3 points in order to demonstrate clinically significant improvement in balance.      Baseline  BERG:  51/56    Time  4    Period  Weeks    Status  New    Target Date  03/20/18      PT LONG TERM GOAL #2   Title  Pt will improve DGI by at least 3 points in order to demonstrate clinically significant improvement in balance and decreased risk for falls.    Baseline  DGI: 19/24    Time  4    Period  Weeks    Status  New    Target Date  03/20/18      PT LONG  TERM GOAL #3   Title  Pt will decrease 5TSTS by at least 3 seconds in order to demonstrate clinically significant improvement in LE strength.    Baseline  5TSTS: 18.28 sec    Time  4    Period  Weeks    Status  New    Target Date  03/20/18      PT LONG TERM GOAL #4   Title  Pt. will complete FOTO and improve to 47 to improve daily functional mobility.     Baseline  FOTO: 32    Time  4    Period  Weeks    Target Date  03/20/18        Plan - 02/20/18 1504    Clinical Impression Statement  Pt is a pleasant 67 year-old male/male referred for difficulty with balance. PT examination reveals deficits of festinating gait pattern and difficulty with turns. Pt presents with deficits in gait and balance as evidenced by the decline in his scores on the Berg, TUG, and 5TSTS, as well as a borderline score for DGI.  Patient will benefit from skilled PT services to address deficits in balance, learn appropriate assistive device choices and utilization, and decrease risk of falls to improve overall QOL.    Clinical Presentation  Stable    Clinical Decision Making  Low    Rehab Potential  Good    PT Frequency  1x / week    PT Duration  4 weeks    PT Treatment/Interventions  ADLs/Self Care Home Management;Neuromuscular  re-education;Balance training;Therapeutic exercise;Therapeutic activities;Functional mobility training;Patient/family education;Stair training;Manual techniques;Gait training    PT Next Visit Plan  Gait Training with Rollator    PT Home Exercise Plan  SLS, tandem stance, Calf Raises    Consulted and Agree with Plan of Care  Patient;Family member/caregiver    Family Member Consulted  Wife       Patient will benefit from skilled therapeutic intervention in order to improve the following deficits and impairments:  Abnormal gait, Decreased balance, Decreased coordination, Decreased strength, Decreased mobility, Decreased activity tolerance, Decreased endurance  Visit Diagnosis: Gait difficulty  Unsteadiness on feet  Impairment of balance     Problem List Patient Active Problem List   Diagnosis Date Noted  . Personal history of colonic polyps   . Benign neoplasm of descending colon   . Polyp of sigmoid colon   . Rectal polyp   . Mild episode of recurrent major depressive disorder ([place] [facility]) 07/14/2017  . Essential hypertension 12/31/2014  . Hyperlipidemia 12/31/2014  . Taking multiple medications for chronic disease 12/31/2014   Gwenlyn Saran, SPT  This entire session was performed under direct supervision and direction of a licensed therapist/therapist assistant . I have personally read, edited and approve of the note as written.  Myles Gip PT, DPT [phone] 02/20/2018, 4:40 PM  [place] [address] [place], [place], [address] Phone: [phone]   Fax:  [phone]  Name: Travis Robertson MRN: [id number] Date of Birth: 15-Apr-1951

## 2018-03-02 ENCOUNTER — Ambulatory Visit: Payer: Medicare Other | Attending: Neurology | Admitting: Physical Therapy

## 2018-03-02 ENCOUNTER — Encounter: Payer: Self-pay | Admitting: Physical Therapy

## 2018-03-02 DIAGNOSIS — M62561 Muscle wasting and atrophy, not elsewhere classified, right lower leg: Secondary | ICD-10-CM | POA: Diagnosis not present

## 2018-03-02 DIAGNOSIS — R2681 Unsteadiness on feet: Secondary | ICD-10-CM | POA: Insufficient documentation

## 2018-03-02 DIAGNOSIS — R269 Unspecified abnormalities of gait and mobility: Secondary | ICD-10-CM | POA: Insufficient documentation

## 2018-03-02 DIAGNOSIS — R2689 Other abnormalities of gait and mobility: Secondary | ICD-10-CM | POA: Diagnosis not present

## 2018-03-02 NOTE — Therapy (Signed)
[facility] [address] [place], [place], [address] Phone: [phone]   Fax:  [phone]  Physical Therapy Treatment  Patient Details  Name: Travis Robertson MRN: [id number] Date of Birth: 10-14-1950 No data recorded  Encounter Date: 03/02/2018  PT End of Session - 03/02/18 1800    Visit Number  2    Number of Visits  4    Date for PT Re-Evaluation  03/20/18    PT Start Time  1341    PT Stop Time  1421    PT Time Calculation (min)  40 min    Equipment Utilized During Treatment  Gait belt;Other (comment)   SPC, Rolling Walker, Rollator   Activity Tolerance  Patient tolerated treatment well    Behavior During Therapy  WFL for tasks assessed/performed       Past Medical History:  Diagnosis Date  . Arthritis    hands  . Corticobasal syndrome ([place])   . Hyperlipidemia   . Hypertension   . Impingement syndrome of shoulder     Past Surgical History:  Procedure Laterality Date  . APPENDECTOMY    . CATARACT EXTRACTION Bilateral   . COLONOSCOPY  01/24/2012   cleared for 5 yrs- [facility] C Docs  . COLONOSCOPY WITH PROPOFOL N/A 07/15/2017   Procedure: COLONOSCOPY WITH PROPOFOL;  Surgeon: Lucilla Lame, MD;  Location: [place];  Service: Endoscopy;  Laterality: N/A;  . POLYPECTOMY  07/15/2017   Procedure: POLYPECTOMY INTESTINAL;  Surgeon: Lucilla Lame, MD;  Location: [place];  Service: Endoscopy;;  Ascending colon polyp Sigmoid colon polyp x 2 Rectal polyp x 2  . RETINAL DETACHMENT SURGERY Bilateral     There were no vitals filed for this visit.  Subjective Assessment - 03/02/18 1339    Subjective  Pt. reports he has been to [employer] and has been 3 times since initial evaluation.  Pt. notes that it is challenging, however he enjoys it.  Pt. was afraid he would be unable to perform the exercises, however he is able to sit and perform the exercises.     Patient is accompained by:  Family member     Pertinent History  Known to clinic, diagnosed with thyroid cancer since last visit to clinic, progression of Parkinsonism sx's.    Limitations  Walking    Patient Stated Goals  Increase balance/ gait endurance.      Currently in Pain?  No/denies            Treatment  Stair Training x10 with reciprocal gait pattern, x10  Pt. Had slight hesitation with descending stairs Walking laps around gym with focus on heel strike w/ and w/o rollator, x5 Ambulation outside across varying surfaces (grass, mulch, pavement, curbs, etc.) with rollator  Pt. Given prescription for rollator to obtain from local medical supply store.          PT Long Term Goals - 02/20/18 1506      PT LONG TERM GOAL #1   Title  Pt will improve BERG by at least 3 points in order to demonstrate clinically significant improvement in balance.      Baseline  BERG:  51/56    Time  4    Period  Weeks    Status  New    Target Date  03/20/18      PT LONG TERM GOAL #2   Title  Pt will improve DGI by at least 3 points in order to demonstrate clinically  significant improvement in balance and decreased risk for falls.    Baseline  DGI: 19/24    Time  4    Period  Weeks    Status  New    Target Date  03/20/18      PT LONG TERM GOAL #3   Title  Pt will decrease 5TSTS by at least 3 seconds in order to demonstrate clinically significant improvement in LE strength.    Baseline  5TSTS: 18.28 sec    Time  4    Period  Weeks    Status  New    Target Date  03/20/18      PT LONG TERM GOAL #4   Title  Pt. will complete FOTO and improve to 47 to improve daily functional mobility.     Baseline  FOTO: 32    Time  4    Period  Weeks    Target Date  03/20/18            Plan - 03/02/18 1800    Clinical Impression Statement  Pt. given rollator prescription today during therapy.  Pt. reassessed for gait pattern with use of rollator inside and outside the clinic.  Pt. continues to perform well with rollator and  manages to display increased stability when walking with AD.  Pt. performed well on stairs as well, noting a slight hesitation when descending stairway.  Pt. will continue with boxing class and was encouraged to reach out for possible other Parkinson's classes that may be beneficial to him as well.  Pt. will continue to improve with more gym-based exercises in order to improve ambulation mechnanics.  Pt encouraged to reach out to clinic if any regression occurs or if service is needed for rollator with gait training.    Clinical Presentation  Stable    Clinical Decision Making  Low    Rehab Potential  Good    PT Frequency  1x / week    PT Duration  4 weeks    PT Treatment/Interventions  ADLs/Self Care Home Management;Neuromuscular re-education;Balance training;Therapeutic exercise;Therapeutic activities;Functional mobility training;Patient/family education;Stair training;Manual techniques;Gait training    PT Next Visit Plan  d/c at next visit    PT Home Exercise Plan  SLS, tandem stance, Calf Raises    Consulted and Agree with Plan of Care  Patient;Family member/caregiver    Family Member Consulted  Wife       Patient will benefit from skilled therapeutic intervention in order to improve the following deficits and impairments:  Abnormal gait, Decreased balance, Decreased coordination, Decreased strength, Decreased mobility, Decreased activity tolerance, Decreased endurance  Visit Diagnosis: Gait difficulty  Unsteadiness on feet  Impairment of balance  Atrophy of calf muscles on right     Problem List Patient Active Problem List   Diagnosis Date Noted  . Personal history of colonic polyps   . Benign neoplasm of descending colon   . Polyp of sigmoid colon   . Rectal polyp   . Mild episode of recurrent major depressive disorder ([place]) 07/14/2017  . Essential hypertension 12/31/2014  . Hyperlipidemia 12/31/2014  . Taking multiple medications for chronic disease 12/31/2014   Pura Spice, PT, DPT # [id number] Gwenlyn Saran, SPT 03/02/2018, 6:05 PM  [place] [facility] [address] [place], [place], [address] Phone: [phone]   Fax:  [phone]  Name: Travis Robertson MRN: [id number] Date of Birth: 04-21-1951

## 2018-04-08 ENCOUNTER — Other Ambulatory Visit: Payer: Self-pay | Admitting: Family Medicine

## 2018-04-08 DIAGNOSIS — F330 Major depressive disorder, recurrent, mild: Secondary | ICD-10-CM

## 2018-04-10 DIAGNOSIS — Z9889 Other specified postprocedural states: Secondary | ICD-10-CM | POA: Diagnosis not present

## 2018-04-10 DIAGNOSIS — C73 Malignant neoplasm of thyroid gland: Secondary | ICD-10-CM | POA: Diagnosis not present

## 2018-04-10 DIAGNOSIS — R2689 Other abnormalities of gait and mobility: Secondary | ICD-10-CM | POA: Diagnosis not present

## 2018-04-10 DIAGNOSIS — G3185 Corticobasal degeneration: Secondary | ICD-10-CM | POA: Diagnosis not present

## 2018-04-12 DIAGNOSIS — E042 Nontoxic multinodular goiter: Secondary | ICD-10-CM | POA: Diagnosis not present

## 2018-04-13 ENCOUNTER — Encounter: Payer: Self-pay | Admitting: Emergency Medicine

## 2018-04-13 ENCOUNTER — Ambulatory Visit
Admission: EM | Admit: 2018-04-13 | Discharge: 2018-04-13 | Disposition: A | Discharge status: Home / Self Care | Payer: Medicare Other | Attending: Family Medicine | Admitting: Family Medicine

## 2018-04-13 ENCOUNTER — Ambulatory Visit
Admit: 2018-04-13 | Discharge: 2018-04-13 | Disposition: A | Discharge status: Home / Self Care | Payer: Medicare Other | Attending: Family Medicine | Admitting: Family Medicine

## 2018-04-13 ENCOUNTER — Other Ambulatory Visit: Payer: Self-pay

## 2018-04-13 DIAGNOSIS — G319 Degenerative disease of nervous system, unspecified: Secondary | ICD-10-CM | POA: Diagnosis not present

## 2018-04-13 DIAGNOSIS — G20 Parkinson's disease: Secondary | ICD-10-CM | POA: Insufficient documentation

## 2018-04-13 DIAGNOSIS — I10 Essential (primary) hypertension: Secondary | ICD-10-CM | POA: Diagnosis not present

## 2018-04-13 DIAGNOSIS — D124 Benign neoplasm of descending colon: Secondary | ICD-10-CM | POA: Diagnosis not present

## 2018-04-13 DIAGNOSIS — M7540 Impingement syndrome of unspecified shoulder: Secondary | ICD-10-CM | POA: Diagnosis not present

## 2018-04-13 DIAGNOSIS — Z7982 Long term (current) use of aspirin: Secondary | ICD-10-CM | POA: Diagnosis not present

## 2018-04-13 DIAGNOSIS — M19041 Primary osteoarthritis, right hand: Secondary | ICD-10-CM | POA: Diagnosis not present

## 2018-04-13 DIAGNOSIS — Z79899 Other long term (current) drug therapy: Secondary | ICD-10-CM | POA: Insufficient documentation

## 2018-04-13 DIAGNOSIS — E785 Hyperlipidemia, unspecified: Secondary | ICD-10-CM | POA: Insufficient documentation

## 2018-04-13 DIAGNOSIS — S0003XA Contusion of scalp, initial encounter: Secondary | ICD-10-CM | POA: Diagnosis not present

## 2018-04-13 DIAGNOSIS — S0990XA Unspecified injury of head, initial encounter: Secondary | ICD-10-CM | POA: Insufficient documentation

## 2018-04-13 DIAGNOSIS — Z79890 Hormone replacement therapy (postmenopausal): Secondary | ICD-10-CM | POA: Insufficient documentation

## 2018-04-13 DIAGNOSIS — W19XXXA Unspecified fall, initial encounter: Secondary | ICD-10-CM | POA: Diagnosis not present

## 2018-04-13 DIAGNOSIS — M19042 Primary osteoarthritis, left hand: Secondary | ICD-10-CM | POA: Insufficient documentation

## 2018-04-13 DIAGNOSIS — X58XXXA Exposure to other specified factors, initial encounter: Secondary | ICD-10-CM | POA: Diagnosis not present

## 2018-04-13 DIAGNOSIS — R42 Dizziness and giddiness: Secondary | ICD-10-CM | POA: Insufficient documentation

## 2018-04-13 DIAGNOSIS — Z87891 Personal history of nicotine dependence: Secondary | ICD-10-CM | POA: Insufficient documentation

## 2018-04-13 HISTORY — DX: Parkinson's disease: G20

## 2018-04-13 HISTORY — DX: Parkinson's disease without dyskinesia, without mention of fluctuations: G20.A1

## 2018-04-13 MED ORDER — MECLIZINE HCL 25 MG PO TABS: 25.0000 mg | ORAL_TABLET | Freq: Three times a day (TID) | ORAL | 0 refills | Status: DC | PRN

## 2018-04-13 NOTE — ED Triage Notes (Signed)
Called [employer] [facility] Supplement) for pre-cert of CT head without contrast. Per automated voice system, no pre-cert required if Medicare is primary.

## 2018-04-13 NOTE — Discharge Instructions (Signed)
Medication if needed.  Rest.  CT was negative.  Take care  Dr. Lacinda Axon

## 2018-04-13 NOTE — ED Provider Notes (Signed)
[facility]    CSN: [id number] Arrival date & time: 04/13/18  1235   History   Chief Complaint Chief Complaint  Patient presents with  . Fall    DOI 04/12/18   HPI  67 year old male presents for evaluation of dizziness after suffering a fall.  Patient has cortical basal syndrome and has much difficulty with gait and imbalance.  He is followed by neurology and undergoes physical therapy.  Yesterday he was going into the house and fell backwards down approximately 4-5 steps and hit his head on a cement floor in the garage.  Patient denies pain currently.  No reports of vision changes.  He does report that as of last night and today he has developed dizziness.  He feels that he has vertigo.  He states that he feels as if the room is spinning very fast.  No associated nausea.  He does report worsening balance associated with his dizziness.  Worse with certain movements.  Was worse today when bending down to pick up his socks.  No relieving factors.  No other reported symptoms.  No other complaints.  PMH, Surgical Hx, Family Hx, Social History reviewed and updated as below.  Past Medical History:  Diagnosis Date  . Arthritis    hands  . Corticobasal syndrome ([place])   . Hyperlipidemia   . Hypertension   . Impingement syndrome of shoulder   . Parkinson disease [facility])     Patient Active Problem List   Diagnosis Date Noted  . Personal history of colonic polyps   . Benign neoplasm of descending colon   . Polyp of sigmoid colon   . Rectal polyp   . Mild episode of recurrent major depressive disorder ([place]) 07/14/2017  . Essential hypertension 12/31/2014  . Hyperlipidemia 12/31/2014  . Taking multiple medications for chronic disease 12/31/2014    Past Surgical History:  Procedure Laterality Date  . APPENDECTOMY    . CATARACT EXTRACTION Bilateral   . COLONOSCOPY  01/24/2012   cleared for 5 yrs- K C Docs  . COLONOSCOPY WITH PROPOFOL N/A 07/15/2017   Procedure:  COLONOSCOPY WITH PROPOFOL;  Surgeon: Lucilla Lame, MD;  Location: [place];  Service: Endoscopy;  Laterality: N/A;  . POLYPECTOMY  07/15/2017   Procedure: POLYPECTOMY INTESTINAL;  Surgeon: Lucilla Lame, MD;  Location: [place];  Service: Endoscopy;;  Ascending colon polyp Sigmoid colon polyp x 2 Rectal polyp x 2  . RETINAL DETACHMENT SURGERY Bilateral        Home Medications    Prior to Admission medications   Medication Sig Start Date End Date Taking? Authorizing Provider  aspirin 81 MG tablet Take 81 mg by mouth daily.   Yes [provider]  atorvastatin (LIPITOR) 10 MG tablet Take 1 tablet (10 mg total) by mouth daily. 02/14/18  Yes Juline Patch, MD  carbidopa-levodopa (SINEMET) 25-100 MG tablet Take 3 tablets by mouth 3 (three) times daily. 04/21/17  Yes [provider]  levothyroxine (SYNTHROID, LEVOTHROID) 100 MCG tablet Take 100 mcg by mouth daily. Dr [name] 02/07/18  Yes [provider]  losartan-hydrochlorothiazide (HYZAAR) 50-12.5 MG tablet Take 1 tablet by mouth daily. 02/14/18  Yes Juline Patch, MD  metoprolol succinate (TOPROL-XL) 100 MG 24 hr tablet TAKE 1 TABLET DAILY. TAKE  WITH OR IMMEDIATELY        FOLLOWING A MEAL. 02/14/18  Yes Juline Patch, MD  sertraline (ZOLOFT) 50 MG tablet TAKE 1 TABLET BY MOUTH EVERY DAY 04/10/18  Yes  Juline Patch, MD  meclizine (ANTIVERT) 25 MG tablet Take 1 tablet (25 mg total) by mouth 3 (three) times daily as needed for dizziness. 04/13/18   Coral Spikes, DO    Family History Family History  Problem Relation Age of Onset  . Dementia Mother   . Stroke Father     Social History Social History   Tobacco Use  . Smoking status: Former Smoker    Packs/day: 1.50    Years: 40.00    Pack years: 60.00    Types: Cigarettes    Last attempt to quit: 07/2015    Years since quitting: 2.7  . Smokeless tobacco: Never Used  . Tobacco comment: smoking cessation materials not  required  Substance Use Topics  . Alcohol use: Not Currently    Alcohol/week: 14.0 standard drinks    Types: 14 Cans of beer per week  . Drug use: No     Allergies   Patient has no known allergies.   Review of Systems Review of Systems  Eyes: Negative.   Musculoskeletal: Positive for gait problem.  Neurological: Positive for dizziness.   Physical Exam Triage Vital Signs ED Triage Vitals  Enc Vitals Group     BP 04/13/18 1247 136/66     Pulse Rate 04/13/18 1247 62     Resp 04/13/18 1247 16     Temp 04/13/18 1247 97.8 F (36.6 C)     Temp Source 04/13/18 1247 Oral     SpO2 04/13/18 1247 100 %     Weight 04/13/18 1246 162 lb (73.5 kg)     Height 04/13/18 1246 5\' 8"  (1.727 m)     Head Circumference --      Peak Flow --      Pain Score 04/13/18 1246 0     Pain Loc --      Pain Edu? --      Excl. in [place]? --    Updated Vital Signs BP 136/66 (BP Location: Left Arm)   Pulse 62   Temp 97.8 F (36.6 C) (Oral)   Resp 16   Ht 5\' 8"  (1.727 m)   Wt 73.5 kg   SpO2 100%   BMI 24.63 kg/m   Visual Acuity Right Eye Distance:   Left Eye Distance:   Bilateral Distance:    Right Eye Near:   Left Eye Near:    Bilateral Near:     Physical Exam Constitutional:      General: He is not in acute distress. HENT:     Head:     Comments: No apparent hematoma or scalp step off.    Nose: Nose normal.  Eyes:     Conjunctiva/sclera: Conjunctivae normal.     Pupils: Pupils are equal, round, and reactive to light.  Cardiovascular:     Rate and Rhythm: Normal rate and regular rhythm.  Pulmonary:     Effort: Pulmonary effort is normal.     Breath sounds: No wheezing, rhonchi or rales.  Neurological:     Mental Status: He is alert and oriented to person, place, and time. Mental status is at baseline.     Cranial Nerves: No cranial nerve deficit.     Motor: No weakness.  Psychiatric:        Mood and Affect: Mood normal.        Behavior: Behavior normal.    UC Treatments /  Results  Labs (all labs ordered are listed, but only abnormal results are displayed) Labs Reviewed -  No data to display  EKG None  Radiology Ct Head Wo Contrast  Result Date: 04/13/2018 CLINICAL DATA:  Blow to the back of the head when the patient fell yesterday due to stumbling on steps. Initial encounter. EXAM: CT HEAD WITHOUT CONTRAST TECHNIQUE: Contiguous axial images were obtained from the base of the skull through the vertex without intravenous contrast. COMPARISON:  Brain MRI 11/02/2016. FINDINGS: Brain: No evidence of acute infarction, hemorrhage, hydrocephalus, extra-axial collection or mass lesion/mass effect. Cortical atrophy is noted. Vascular: Atherosclerosis is identified. Skull: Intact.  No focal lesion. Sinuses/Orbits: Paranasal sinuses are clear. Metallic artifact is seen adjacent to the medial side of the right globe, presumably postoperative. Postoperative change of fixation of retinal detachment and cataract surgery noted. Other: Scalp contusion posteriorly on the right is noted. IMPRESSION: Negative for fracture or acute intracranial normality. Cortical atrophy. Scalp contusion posteriorly on the right. Electronically Signed   By: Inge Rise M.D.   On: 04/13/2018 13:53    Procedures Procedures (including critical care time)  Medications Ordered in UC Medications - No data to display  Initial Impression / Assessment and Plan / UC Course  I have reviewed the triage vital signs and the nursing notes.  Pertinent labs & imaging results that were available during my care of the patient were reviewed by me and considered in my medical decision making (see chart for details).    67 year old male presents with minor head injury and dizziness.  CT negative.  Meclizine as needed.  Final Clinical Impressions(s) / UC Diagnoses   Final diagnoses:  Minor head injury, initial encounter  Dizziness     Discharge Instructions     Medication if needed.  Rest.  CT was  negative.  Take care  Dr. Lacinda Axon    ED Prescriptions    Medication Sig Dispense Auth. Provider   meclizine (ANTIVERT) 25 MG tablet Take 1 tablet (25 mg total) by mouth 3 (three) times daily as needed for dizziness. 30 tablet Coral Spikes, DO     Controlled Substance Prescriptions [place] Controlled Substance Registry consulted? Not Applicable   Coral Spikes, DO 04/13/18 1423

## 2018-04-13 NOTE — ED Triage Notes (Signed)
Patient in today after falling yesterday afternoon. Patient states he stumbled on some steps and lost his balance and fell backwards hitting his head on the cement. Patient is c/o dizziness last night and this morning.

## 2018-04-24 DIAGNOSIS — C73 Malignant neoplasm of thyroid gland: Secondary | ICD-10-CM | POA: Diagnosis not present

## 2018-04-24 DIAGNOSIS — E890 Postprocedural hypothyroidism: Secondary | ICD-10-CM | POA: Diagnosis not present

## 2018-05-03 DIAGNOSIS — H338 Other retinal detachments: Secondary | ICD-10-CM | POA: Diagnosis not present

## 2018-05-03 DIAGNOSIS — Z961 Presence of intraocular lens: Secondary | ICD-10-CM | POA: Diagnosis not present

## 2018-07-04 ENCOUNTER — Ambulatory Visit
Admission: RE | Admit: 2018-07-04 | Discharge: 2018-07-04 | Disposition: A | Discharge status: Home / Self Care | Payer: Medicare Other | Source: Ambulatory Visit | Attending: Family Medicine | Admitting: Family Medicine

## 2018-07-04 ENCOUNTER — Ambulatory Visit (INDEPENDENT_AMBULATORY_CARE_PROVIDER_SITE_OTHER): Payer: Medicare Other | Admitting: Family Medicine

## 2018-07-04 ENCOUNTER — Ambulatory Visit
Admission: RE | Admit: 2018-07-04 | Discharge: 2018-07-04 | Disposition: A | Discharge status: Home / Self Care | Payer: Medicare Other | Attending: Family Medicine | Admitting: Family Medicine

## 2018-07-04 ENCOUNTER — Encounter: Payer: Self-pay | Admitting: Family Medicine

## 2018-07-04 VITALS — BP 130/80 | HR 70 | Ht 68.0 in | Wt 162.0 lb

## 2018-07-04 DIAGNOSIS — S20222A Contusion of left back wall of thorax, initial encounter: Secondary | ICD-10-CM

## 2018-07-04 DIAGNOSIS — S22070A Wedge compression fracture of T9-T10 vertebra, initial encounter for closed fracture: Secondary | ICD-10-CM | POA: Diagnosis not present

## 2018-07-04 DIAGNOSIS — S29011A Strain of muscle and tendon of front wall of thorax, initial encounter: Secondary | ICD-10-CM

## 2018-07-04 DIAGNOSIS — S22080A Wedge compression fracture of T11-T12 vertebra, initial encounter for closed fracture: Secondary | ICD-10-CM | POA: Diagnosis not present

## 2018-07-04 MED ORDER — ETODOLAC 400 MG PO TABS: 400.0000 mg | ORAL_TABLET | Freq: Two times a day (BID) | ORAL | 1 refills | Status: DC

## 2018-07-04 MED ORDER — TRAMADOL HCL 50 MG PO TABS: 50.0000 mg | ORAL_TABLET | Freq: Four times a day (QID) | ORAL | 0 refills | Status: DC | PRN

## 2018-07-04 NOTE — Progress Notes (Signed)
Date:  07/04/2018   Name:  Travis Robertson   DOB:  Aug 05, 1950   MRN:  [id number]   Chief Complaint: Fall (fell approx 10 days ago- getting off toilet and lost balnce. Hit head on left side/ eyebrow on tub. Glasses left a black eye. Has had lower back pain- Ibuprofen helps pain. Has had 4-5 multiple falls since then. Walking seems to get worse after each fall. Has been through several rounds of PT. )  Fall  The accident occurred more than 1 week ago (10 days ago). Fall occurred: toilet. He fell from a height of 1 to 2 ft. The point of impact was the face and head (torso). The pain is present in the head and back (mid back). The pain is at a severity of 9/10. The pain is moderate. The symptoms are aggravated by movement. Pertinent negatives include no abdominal pain, bowel incontinence, fever, headaches, hearing loss, hematuria, loss of consciousness, nausea, numbness, tingling or visual change. He has tried NSAID for the symptoms.    Review of Systems  Constitutional: Negative for chills and fever.  HENT: Negative for drooling, ear discharge, ear pain and sore throat.   Respiratory: Negative for cough, shortness of breath and wheezing.   Cardiovascular: Negative for chest pain, palpitations and leg swelling.  Gastrointestinal: Negative for abdominal pain, blood in stool, bowel incontinence, constipation, diarrhea and nausea.  Endocrine: Negative for polydipsia.  Genitourinary: Negative for dysuria, frequency, hematuria and urgency.  Musculoskeletal: Negative for back pain, myalgias and neck pain.  Skin: Negative for rash.  Allergic/Immunologic: Negative for environmental allergies.  Neurological: Negative for dizziness, tingling, loss of consciousness, numbness and headaches.  Hematological: Does not bruise/bleed easily.  Psychiatric/Behavioral: Negative for suicidal ideas. The patient is not nervous/anxious.     Patient Active Problem List   Diagnosis Date Noted  . Personal  history of colonic polyps   . Benign neoplasm of descending colon   . Polyp of sigmoid colon   . Rectal polyp   . Mild episode of recurrent major depressive disorder ([place]) 07/14/2017  . Essential hypertension 12/31/2014  . Hyperlipidemia 12/31/2014  . Taking multiple medications for chronic disease 12/31/2014    No Known Allergies  Past Surgical History:  Procedure Laterality Date  . APPENDECTOMY    . CATARACT EXTRACTION Bilateral   . COLONOSCOPY  01/24/2012   cleared for 5 yrs- [facility]  . COLONOSCOPY WITH PROPOFOL N/A 07/15/2017   Procedure: COLONOSCOPY WITH PROPOFOL;  Surgeon: Lucilla Lame, MD;  Location: [facility];  Service: Endoscopy;  Laterality: N/A;  . POLYPECTOMY  07/15/2017   Procedure: POLYPECTOMY INTESTINAL;  Surgeon: Lucilla Lame, MD;  Location: [facility];  Service: Endoscopy;;  Ascending colon polyp Sigmoid colon polyp x 2 Rectal polyp x 2  . RETINAL DETACHMENT SURGERY Bilateral     Social History   Tobacco Use  . Smoking status: Former Smoker    Packs/day: 1.50    Years: 40.00    Pack years: 60.00    Types: Cigarettes    Last attempt to quit: 07/2015    Years since quitting: 2.9  . Smokeless tobacco: Never Used  . Tobacco comment: smoking cessation materials not required  Substance Use Topics  . Alcohol use: Not Currently    Alcohol/week: 14.0 standard drinks    Types: 14 Cans of beer per week  . Drug use: No     Medication list has been reviewed and updated.  Current Meds  Medication Sig  .  aspirin 81 MG tablet Take 81 mg by mouth daily.  Marland Kitchen atorvastatin (LIPITOR) 10 MG tablet Take 1 tablet (10 mg total) by mouth daily.  . carbidopa-levodopa (SINEMET) 25-100 MG tablet Take 3 tablets by mouth 3 (three) times daily.  Marland Kitchen levothyroxine (SYNTHROID, LEVOTHROID) 100 MCG tablet Take 100 mcg by mouth daily. Dr [name]  . losartan-hydrochlorothiazide (HYZAAR) 50-12.5 MG tablet Take 1 tablet by mouth daily.  . metoprolol succinate  (TOPROL-XL) 100 MG 24 hr tablet TAKE 1 TABLET DAILY. TAKE  WITH OR IMMEDIATELY        FOLLOWING A MEAL.  Marland Kitchen sertraline (ZOLOFT) 50 MG tablet TAKE 1 TABLET BY MOUTH EVERY DAY    PHQ 2/9 Scores 07/04/2018 02/14/2018 07/04/2017 07/04/2017  PHQ - 2 Score 0 0 0 0  PHQ- 9 Score 7 6 0 -    Physical Exam Vitals signs and nursing note reviewed.  HENT:     Head: Normocephalic.     Right Ear: Tympanic membrane, ear canal and external ear normal.     Left Ear: Tympanic membrane, ear canal and external ear normal.     Nose: Nose normal. No congestion.  Eyes:     General: No scleral icterus.       Right eye: No discharge.        Left eye: No discharge.     Conjunctiva/sclera: Conjunctivae normal.     Pupils: Pupils are equal, round, and reactive to light.  Neck:     Musculoskeletal: Normal range of motion and neck supple.     Thyroid: No thyromegaly.     Vascular: No JVD.     Trachea: No tracheal deviation.  Cardiovascular:     Rate and Rhythm: Normal rate and regular rhythm.     Heart sounds: Normal heart sounds. No murmur. No friction rub. No gallop.   Pulmonary:     Effort: No respiratory distress.     Breath sounds: Normal breath sounds. No wheezing or rales.  Abdominal:     General: Bowel sounds are normal.     Palpations: Abdomen is soft. There is no mass.     Tenderness: There is no abdominal tenderness. There is no guarding or rebound.  Musculoskeletal:     Thoracic back: He exhibits decreased range of motion, tenderness and spasm. He exhibits no bony tenderness, no swelling and no deformity.     Comments: ecchymosis  Lymphadenopathy:     Cervical: No cervical adenopathy.  Skin:    General: Skin is warm.     Findings: No rash.  Neurological:     Mental Status: He is alert and oriented to person, place, and time.     Cranial Nerves: No cranial nerve deficit.     Deep Tendon Reflexes: Reflexes are normal and symmetric.     Wt Readings from Last 3 Encounters:  07/04/18 162 lb  (73.5 kg)  04/13/18 162 lb (73.5 kg)  02/14/18 161 lb (73 kg)    BP 130/80   Pulse 70   Ht 5\' 8"  (1.727 m)   Wt 162 lb (73.5 kg)   BMI 24.63 kg/m   Assessment and Plan: 1. Contusion of left back wall of thorax, initial encounter New onset persistent pain patient sustained a fall approximately a week ago with persistent pain in the left thoracic of lumbar area.  Extending to the inferior lateral chest wall area.  Contusion was noted patient had tenderness over the ribs obtained an x-ray of the thoracic to rule out a  compression fracture and initiate etodolac 400 mg twice a day for inflammatory discomfort and tramadol 50 mg every 6 hours as needed pain but primarily for pain during sleep. - etodolac (LODINE) 400 MG tablet; Take 1 tablet (400 mg total) by mouth 2 (two) times daily.  Dispense: 60 tablet; Refill: 1 - traMADol (ULTRAM) 50 MG tablet; Take 1 tablet (50 mg total) by mouth every 6 (six) hours as needed.  Dispense: 20 tablet; Refill: 0 - DG Thoracic Spine W/Swimmers; Future  2. Intercostal muscle strain, initial encounter Patient does have some tenderness without palpable defect of the rib margins noted bilateral likely has some tearing or bruising of the intercostal muscles in the eighth ninth and 10th rib margins.  Continue with the etodolac per above. - etodolac (LODINE) 400 MG tablet; Take 1 tablet (400 mg total) by mouth 2 (two) times daily.  Dispense: 60 tablet; Refill: 1 - traMADol (ULTRAM) 50 MG tablet; Take 1 tablet (50 mg total) by mouth every 6 (six) hours as needed.  Dispense: 20 tablet; Refill: 0

## 2018-07-05 ENCOUNTER — Other Ambulatory Visit: Payer: Self-pay

## 2018-07-05 DIAGNOSIS — S22070G Wedge compression fracture of T9-T10 vertebra, subsequent encounter for fracture with delayed healing: Secondary | ICD-10-CM

## 2018-07-05 NOTE — Progress Notes (Unsigned)
Ortho referral  

## 2018-07-06 ENCOUNTER — Ambulatory Visit: Payer: Medicare Other

## 2018-07-10 ENCOUNTER — Ambulatory Visit: Payer: Medicare Other

## 2018-07-12 DIAGNOSIS — S22070A Wedge compression fracture of T9-T10 vertebra, initial encounter for closed fracture: Secondary | ICD-10-CM | POA: Diagnosis not present

## 2018-07-21 ENCOUNTER — Other Ambulatory Visit: Payer: Self-pay

## 2018-07-21 DIAGNOSIS — F330 Major depressive disorder, recurrent, mild: Secondary | ICD-10-CM

## 2018-07-21 MED ORDER — SERTRALINE HCL 50 MG PO TABS: 50.0000 mg | ORAL_TABLET | Freq: Every day | ORAL | 0 refills | Status: DC

## 2018-08-08 ENCOUNTER — Encounter: Payer: Self-pay | Admitting: Family Medicine

## 2018-08-08 ENCOUNTER — Other Ambulatory Visit: Payer: Self-pay

## 2018-08-08 ENCOUNTER — Ambulatory Visit (INDEPENDENT_AMBULATORY_CARE_PROVIDER_SITE_OTHER): Payer: Medicare Other | Admitting: Family Medicine

## 2018-08-08 ENCOUNTER — Other Ambulatory Visit: Payer: Self-pay | Admitting: Family Medicine

## 2018-08-08 DIAGNOSIS — I10 Essential (primary) hypertension: Secondary | ICD-10-CM | POA: Diagnosis not present

## 2018-08-08 DIAGNOSIS — E785 Hyperlipidemia, unspecified: Secondary | ICD-10-CM

## 2018-08-08 DIAGNOSIS — F330 Major depressive disorder, recurrent, mild: Secondary | ICD-10-CM

## 2018-08-08 MED ORDER — ATORVASTATIN CALCIUM 10 MG PO TABS: 10.0000 mg | ORAL_TABLET | Freq: Every day | ORAL | 1 refills | Status: DC

## 2018-08-08 MED ORDER — METOPROLOL SUCCINATE ER 100 MG PO TB24: ORAL_TABLET | ORAL | 1 refills | Status: DC

## 2018-08-08 MED ORDER — SERTRALINE HCL 50 MG PO TABS: 50.0000 mg | ORAL_TABLET | Freq: Every day | ORAL | 0 refills | Status: DC

## 2018-08-08 MED ORDER — LOSARTAN POTASSIUM-HCTZ 50-12.5 MG PO TABS: 1.0000 | ORAL_TABLET | Freq: Every day | ORAL | 1 refills | Status: DC

## 2018-08-08 NOTE — Progress Notes (Signed)
Date:  08/08/2018   Name:  Travis Robertson   DOB:  [birth year]   MRN:  [id number]   Chief Complaint: Depression (PHQ9=0); Hypertension; and Hyperlipidemia  I connected withthis patient, Travis Robertson, by telephoneat the patient's home.  I verified that I am speaking with the correct person using two identifiers. This visit was conducted via telephone due to the Covid-19 outbreak from my office at [facility] in [place], [place]. I discussed the limitations, risks, security and privacy concerns of performing an evaluation and management service by telephone. I also discussed with the patient that there may be a patient responsible charge related to this service. The patient expressed understanding and agreed to proceed.  Depression         This is a chronic problem.  The current episode started more than 1 year ago. The problem is unchanged.  Associated symptoms include decreased concentration.  Associated symptoms include no fatigue, no helplessness, no hopelessness, does not have insomnia, not irritable, no restlessness, no decreased interest, no appetite change, no body aches, no myalgias, no headaches, no indigestion, not sad and no suicidal ideas.( ?side effect)     Exacerbated by: current event.  Past treatments include SSRIs - Selective serotonin reuptake inhibitors.  Compliance with treatment is variable.  Previous treatment provided mild relief.   Pertinent negatives include no anxiety.  (parkinsonism) Hypertension  This is a chronic problem. The current episode started more than 1 year ago. The problem has been waxing and waning since onset. The problem is controlled. Pertinent negatives include no anxiety, blurred vision, chest pain, headaches, malaise/fatigue, neck pain, orthopnea, palpitations, peripheral edema, PND, shortness of breath or sweats. Past treatments include ACE inhibitors, diuretics, beta blockers and angiotensin blockers. The current treatment provides moderate  improvement. There are no compliance problems.  There is no history of angina, kidney disease, CAD/MI, CVA, heart failure, left ventricular hypertrophy, PVD or retinopathy. There is no history of chronic renal disease, a hypertension causing med or renovascular disease.  Hyperlipidemia  This is a chronic problem. The current episode started more than 1 year ago. The problem is controlled. Recent lipid tests were reviewed and are normal. He has no history of chronic renal disease. Factors aggravating his hyperlipidemia include thiazides. Pertinent negatives include no chest pain, focal sensory loss, focal weakness, leg pain, myalgias or shortness of breath. Current antihyperlipidemic treatment includes statins. The current treatment provides moderate improvement of lipids. There are no compliance problems.  Risk factors for coronary artery disease include hypertension.    Review of Systems  Constitutional: Negative for appetite change, chills, fatigue, fever and malaise/fatigue.  HENT: Negative for drooling, ear discharge, ear pain and sore throat.   Eyes: Negative for blurred vision.  Respiratory: Negative for cough, shortness of breath and wheezing.   Cardiovascular: Negative for chest pain, palpitations, orthopnea, leg swelling and PND.  Gastrointestinal: Negative for abdominal pain, blood in stool, constipation, diarrhea and nausea.  Endocrine: Negative for polydipsia.  Genitourinary: Negative for dysuria, frequency, hematuria and urgency.  Musculoskeletal: Negative for back pain, myalgias and neck pain.  Skin: Negative for rash.  Allergic/Immunologic: Negative for environmental allergies.  Neurological: Negative for dizziness, focal weakness and headaches.  Hematological: Does not bruise/bleed easily.  Psychiatric/Behavioral: Positive for decreased concentration and depression. Negative for suicidal ideas. The patient is not nervous/anxious and does not have insomnia.     Patient Active  Problem List   Diagnosis Date Noted  . Personal history of colonic polyps   .  Benign neoplasm of descending colon   . Polyp of sigmoid colon   . Rectal polyp   . Mild episode of recurrent major depressive disorder ([place]) 07/14/2017  . Essential hypertension 12/31/2014  . Hyperlipidemia 12/31/2014  . Taking multiple medications for chronic disease 12/31/2014    No Known Allergies  Past Surgical History:  Procedure Laterality Date  . APPENDECTOMY    . CATARACT EXTRACTION Bilateral   . COLONOSCOPY  01/24/2012   cleared for 5 yrs- [facility]  . COLONOSCOPY WITH PROPOFOL N/A 07/15/2017   Procedure: COLONOSCOPY WITH PROPOFOL;  Surgeon: Lucilla Lame, MD;  Location: [facility];  Service: Endoscopy;  Laterality: N/A;  . POLYPECTOMY  07/15/2017   Procedure: POLYPECTOMY INTESTINAL;  Surgeon: Lucilla Lame, MD;  Location: [facility];  Service: Endoscopy;;  Ascending colon polyp Sigmoid colon polyp x 2 Rectal polyp x 2  . RETINAL DETACHMENT SURGERY Bilateral     Social History   Tobacco Use  . Smoking status: Former Smoker    Packs/day: 1.50    Years: 40.00    Pack years: 60.00    Types: Cigarettes    Last attempt to quit: 07/2015    Years since quitting: 3.0  . Smokeless tobacco: Never Used  . Tobacco comment: smoking cessation materials not required  Substance Use Topics  . Alcohol use: Not Currently    Alcohol/week: 14.0 standard drinks    Types: 14 Cans of beer per week  . Drug use: No     Medication list has been reviewed and updated.  Current Meds  Medication Sig  . aspirin 81 MG tablet Take 81 mg by mouth daily.  . carbidopa-levodopa (SINEMET) 25-100 MG tablet Take 3 tablets by mouth 3 (three) times daily.  Marland Kitchen etodolac (LODINE) 400 MG tablet Take 1 tablet (400 mg total) by mouth 2 (two) times daily.  Marland Kitchen levothyroxine (SYNTHROID, LEVOTHROID) 100 MCG tablet Take 100 mcg by mouth daily. Dr [name]  . sertraline (ZOLOFT) 50 MG tablet Take 1 tablet (50 mg  total) by mouth daily.  . [DISCONTINUED] atorvastatin (LIPITOR) 10 MG tablet Take 1 tablet (10 mg total) by mouth daily.  . [DISCONTINUED] losartan-hydrochlorothiazide (HYZAAR) 50-12.5 MG tablet Take 1 tablet by mouth daily.  . [DISCONTINUED] metoprolol succinate (TOPROL-XL) 100 MG 24 hr tablet TAKE 1 TABLET DAILY. TAKE  WITH OR IMMEDIATELY        FOLLOWING A MEAL.    PHQ 2/9 Scores 08/08/2018 07/04/2018 02/14/2018 07/04/2017  PHQ - 2 Score 0 0 0 0  PHQ- 9 Score 0 7 6 0    BP Readings from Last 3 Encounters:  07/04/18 130/80  04/13/18 136/66  02/14/18 120/64    Physical Exam Vitals signs and nursing note reviewed.  Constitutional:      General: He is not irritable. HENT:     Head: Normocephalic.     Right Ear: Tympanic membrane, ear canal and external ear normal.     Left Ear: Tympanic membrane, ear canal and external ear normal.     Nose: Nose normal.  Eyes:     General: No scleral icterus.       Right eye: No discharge.        Left eye: No discharge.     Conjunctiva/sclera: Conjunctivae normal.     Pupils: Pupils are equal, round, and reactive to light.  Neck:     Musculoskeletal: Normal range of motion and neck supple.     Thyroid: No thyromegaly.  Vascular: No JVD.     Trachea: No tracheal deviation.  Cardiovascular:     Rate and Rhythm: Normal rate and regular rhythm.     Heart sounds: Normal heart sounds. No murmur. No friction rub. No gallop.   Pulmonary:     Effort: No respiratory distress.     Breath sounds: Normal breath sounds. No wheezing or rales.  Abdominal:     General: Bowel sounds are normal.     Palpations: Abdomen is soft. There is no mass.     Tenderness: There is no abdominal tenderness. There is no guarding or rebound.  Musculoskeletal: Normal range of motion.        General: No tenderness.  Lymphadenopathy:     Cervical: No cervical adenopathy.  Skin:    General: Skin is warm.     Findings: No rash.  Neurological:     Mental Status: He is  alert and oriented to person, place, and time.     Cranial Nerves: No cranial nerve deficit.     Deep Tendon Reflexes: Reflexes are normal and symmetric.     Wt Readings from Last 3 Encounters:  08/08/18 165 lb (74.8 kg)  07/04/18 162 lb (73.5 kg)  04/13/18 162 lb (73.5 kg)    Ht 5\' 8"  (1.727 m)   Wt 165 lb (74.8 kg)   BMI 25.09 kg/m   Assessment and Plan: I spent 10 minutes with this patient, More than 50% of that time was spent in voice to voice education, counseling and care coordination. 1. Essential hypertension Chronic.  Controlled.  Continue metoprolol XL 100 mg once a day and losartan hydrochlorothiazide losartan 50-12 0.5 once a day.  2. Hyperlipidemia, unspecified hyperlipidemia type Chronic.  Controlled.  Continue atorvastatin 10 mg once a day.  3. Mild episode of recurrent major depressive disorder ([facility]) Chronic.  Controlled.  Continue sertraline 50 mg once a day.

## 2018-08-27 ENCOUNTER — Other Ambulatory Visit: Payer: Self-pay | Admitting: Family Medicine

## 2018-08-27 DIAGNOSIS — S20222A Contusion of left back wall of thorax, initial encounter: Secondary | ICD-10-CM

## 2018-08-27 DIAGNOSIS — S29011A Strain of muscle and tendon of front wall of thorax, initial encounter: Secondary | ICD-10-CM

## 2018-08-28 DIAGNOSIS — C73 Malignant neoplasm of thyroid gland: Secondary | ICD-10-CM | POA: Diagnosis not present

## 2018-08-28 DIAGNOSIS — E890 Postprocedural hypothyroidism: Secondary | ICD-10-CM | POA: Diagnosis not present

## 2018-08-28 DIAGNOSIS — M810 Age-related osteoporosis without current pathological fracture: Secondary | ICD-10-CM | POA: Diagnosis not present

## 2018-10-17 ENCOUNTER — Ambulatory Visit (INDEPENDENT_AMBULATORY_CARE_PROVIDER_SITE_OTHER): Payer: Medicare Other | Admitting: Family Medicine

## 2018-10-17 ENCOUNTER — Encounter: Payer: Self-pay | Admitting: Family Medicine

## 2018-10-17 ENCOUNTER — Other Ambulatory Visit: Payer: Self-pay

## 2018-10-17 VITALS — BP 130/80 | HR 76 | Ht 68.0 in | Wt 165.0 lb

## 2018-10-17 DIAGNOSIS — F330 Major depressive disorder, recurrent, mild: Secondary | ICD-10-CM

## 2018-10-17 DIAGNOSIS — I10 Essential (primary) hypertension: Secondary | ICD-10-CM | POA: Diagnosis not present

## 2018-10-17 DIAGNOSIS — R69 Illness, unspecified: Secondary | ICD-10-CM

## 2018-10-17 DIAGNOSIS — I700 Atherosclerosis of aorta: Secondary | ICD-10-CM

## 2018-10-17 DIAGNOSIS — E785 Hyperlipidemia, unspecified: Secondary | ICD-10-CM | POA: Diagnosis not present

## 2018-10-17 MED ORDER — SERTRALINE HCL 50 MG PO TABS: 50.0000 mg | ORAL_TABLET | Freq: Every day | ORAL | 0 refills | Status: DC

## 2018-10-17 MED ORDER — LOSARTAN POTASSIUM-HCTZ 50-12.5 MG PO TABS: 1.0000 | ORAL_TABLET | Freq: Every day | ORAL | 1 refills | Status: DC

## 2018-10-17 MED ORDER — ATORVASTATIN CALCIUM 10 MG PO TABS: 10.0000 mg | ORAL_TABLET | Freq: Every day | ORAL | 1 refills | Status: DC

## 2018-10-17 NOTE — Progress Notes (Signed)
Date:  10/17/2018   Name:  Travis Robertson   DOB:  04-Mar-1951   MRN:  [id number]   Chief Complaint: Hypertension (accompanied by wife Susie), Hyperlipidemia, and Depression (phQ9=6/ doing "okay" on the dosing of sertraline)  Hypertension This is a chronic problem. The current episode started more than 1 year ago. The problem is controlled. Pertinent negatives include no anxiety, blurred vision, chest pain, headaches, malaise/fatigue, neck pain, orthopnea, palpitations, peripheral edema, PND, shortness of breath or sweats. There are no associated agents to hypertension. There are no known risk factors for coronary artery disease. Past treatments include diuretics and angiotensin blockers. The current treatment provides moderate improvement. There are no compliance problems.  There is no history of angina, kidney disease, CAD/MI, CVA, heart failure, left ventricular hypertrophy, PVD or retinopathy. There is no history of chronic renal disease, a hypertension causing med or renovascular disease.  Hyperlipidemia This is a chronic problem. The current episode started more than 1 year ago. The problem is controlled. Recent lipid tests were reviewed and are normal. He has no history of chronic renal disease, diabetes, hypothyroidism, liver disease, obesity or nephrotic syndrome. Factors aggravating his hyperlipidemia include thiazides. Pertinent negatives include no chest pain, focal sensory loss, focal weakness, leg pain, myalgias or shortness of breath. Current antihyperlipidemic treatment includes statins. The current treatment provides moderate improvement of lipids. There are no compliance problems.  Risk factors for coronary artery disease include hypertension and dyslipidemia.  Depression        This is a chronic problem.  The current episode started more than 1 year ago.   The onset quality is sudden.   The problem has been waxing and waning since onset.  Associated symptoms include no decreased  concentration, no fatigue, no helplessness, no hopelessness, does not have insomnia, not irritable, no restlessness, no decreased interest, no appetite change, no body aches, no myalgias, no headaches, no indigestion, not sad and no suicidal ideas.  Past treatments include SSRIs - Selective serotonin reuptake inhibitors.  Compliance with treatment is good.  Previous treatment provided moderate relief.   Pertinent negatives include no hypothyroidism and no anxiety.   Review of Systems  Constitutional: Negative for appetite change, chills, fatigue, fever and malaise/fatigue.  HENT: Negative for drooling, ear discharge, ear pain and sore throat.   Eyes: Negative for blurred vision.  Respiratory: Negative for cough, shortness of breath and wheezing.   Cardiovascular: Negative for chest pain, palpitations, orthopnea, leg swelling and PND.  Gastrointestinal: Negative for abdominal pain, blood in stool, constipation, diarrhea and nausea.  Endocrine: Negative for polydipsia.  Genitourinary: Negative for dysuria, frequency, hematuria and urgency.  Musculoskeletal: Negative for back pain, myalgias and neck pain.  Skin: Negative for rash.  Allergic/Immunologic: Negative for environmental allergies.  Neurological: Negative for dizziness, focal weakness and headaches.  Hematological: Does not bruise/bleed easily.  Psychiatric/Behavioral: Positive for depression. Negative for decreased concentration and suicidal ideas. The patient is not nervous/anxious and does not have insomnia.     Patient Active Problem List   Diagnosis Date Noted  . Personal history of colonic polyps   . Benign neoplasm of descending colon   . Polyp of sigmoid colon   . Rectal polyp   . Mild episode of recurrent major depressive disorder ([place]) 07/14/2017  . Essential hypertension 12/31/2014  . Hyperlipidemia 12/31/2014  . Taking multiple medications for chronic disease 12/31/2014    No Known Allergies  Past Surgical History:   Procedure Laterality Date  . APPENDECTOMY    .  CATARACT EXTRACTION Bilateral   . COLONOSCOPY  01/24/2012   cleared for 5 yrs- [facility]  . COLONOSCOPY WITH PROPOFOL N/A 07/15/2017   Procedure: COLONOSCOPY WITH PROPOFOL;  Surgeon: Lucilla Lame, MD;  Location: [place];  Service: Endoscopy;  Laterality: N/A;  . POLYPECTOMY  07/15/2017   Procedure: POLYPECTOMY INTESTINAL;  Surgeon: Lucilla Lame, MD;  Location: [place];  Service: Endoscopy;;  Ascending colon polyp Sigmoid colon polyp x 2 Rectal polyp x 2  . RETINAL DETACHMENT SURGERY Bilateral     Social History   Tobacco Use  . Smoking status: Former Smoker    Packs/day: 1.50    Years: 40.00    Pack years: 60.00    Types: Cigarettes    Quit date: 07/2015    Years since quitting: 3.2  . Smokeless tobacco: Never Used  . Tobacco comment: smoking cessation materials not required  Substance Use Topics  . Alcohol use: Not Currently    Alcohol/week: 14.0 standard drinks    Types: 14 Cans of beer per week  . Drug use: No     Medication list has been reviewed and updated.  Current Meds  Medication Sig  . aspirin 81 MG tablet Take 81 mg by mouth daily.  Marland Kitchen atorvastatin (LIPITOR) 10 MG tablet Take 1 tablet (10 mg total) by mouth daily.  . carbidopa-levodopa (SINEMET) 25-100 MG tablet Take 3 tablets by mouth 3 (three) times daily.  Marland Kitchen levothyroxine (SYNTHROID, LEVOTHROID) 100 MCG tablet Take 100 mcg by mouth daily. Dr [name]  . losartan-hydrochlorothiazide (HYZAAR) 50-12.5 MG tablet Take 1 tablet by mouth daily.  . metoprolol succinate (TOPROL-XL) 100 MG 24 hr tablet Take with or immediately following a meal.  . sertraline (ZOLOFT) 50 MG tablet Take 1 tablet (50 mg total) by mouth daily.    PHQ 2/9 Scores 10/17/2018 08/08/2018 07/04/2018 02/14/2018  PHQ - 2 Score 0 0 0 0  PHQ- 9 Score 6 0 7 6    BP Readings from Last 3 Encounters:  10/17/18 130/80  07/04/18 130/80  04/13/18 136/66    Physical Exam  Vitals signs and nursing note reviewed.  Constitutional:      General: He is not irritable. HENT:     Head: Normocephalic.     Right Ear: External ear normal.     Left Ear: External ear normal.     Nose: Nose normal.  Eyes:     General: No scleral icterus.       Right eye: No discharge.        Left eye: No discharge.     Conjunctiva/sclera: Conjunctivae normal.     Pupils: Pupils are equal, round, and reactive to light.  Neck:     Musculoskeletal: Normal range of motion and neck supple.     Thyroid: No thyromegaly.     Vascular: No JVD.     Trachea: No tracheal deviation.  Cardiovascular:     Rate and Rhythm: Normal rate and regular rhythm.     Heart sounds: Normal heart sounds. No murmur. No friction rub. No gallop.   Pulmonary:     Effort: No respiratory distress.     Breath sounds: Normal breath sounds. No wheezing or rales.  Abdominal:     General: Bowel sounds are normal.     Palpations: Abdomen is soft. There is no mass.     Tenderness: There is no abdominal tenderness. There is no guarding or rebound.  Musculoskeletal: Normal range of motion.  General: No tenderness.  Lymphadenopathy:     Cervical: No cervical adenopathy.  Skin:    General: Skin is warm.     Findings: No rash.  Neurological:     Mental Status: He is alert and oriented to person, place, and time.     Cranial Nerves: No cranial nerve deficit.     Deep Tendon Reflexes: Reflexes are normal and symmetric.     Wt Readings from Last 3 Encounters:  10/17/18 165 lb (74.8 kg)  08/08/18 165 lb (74.8 kg)  07/04/18 162 lb (73.5 kg)    BP 130/80   Pulse 76   Ht 5\' 8"  (1.727 m)   Wt 165 lb (74.8 kg)   BMI 25.09 kg/m   Assessment and Plan: 1. Essential hypertension Chronic.  Controlled.  Continue losartan hydrochlorothiazide 50- 12.51 a day.  Will check renal function panel. - losartan-hydrochlorothiazide (HYZAAR) 50-12.5 MG tablet; Take 1 tablet by mouth daily.  Dispense: 90 tablet; Refill: 1  - Renal Function Panel  2. Hyperlipidemia, unspecified hyperlipidemia type Controlled.  Chronic.  Continue atorvastatin 10 mg once a day.  Will check lipid panel.  - atorvastatin (LIPITOR) 10 MG tablet; Take 1 tablet (10 mg total) by mouth daily.  Dispense: 90 tablet; Refill: 1 - Lipid Panel With LDL/HDL Ratio  3. Mild episode of recurrent major depressive disorder (HCC) Chronic.  Controlled.  PHQ at 8 but patient desires not to make an adjustment on sertraline.  Will continue 50 mg 1 tablet daily. - sertraline (ZOLOFT) 50 MG tablet; Take 1 tablet (50 mg total) by mouth daily.  Dispense: 90 tablet; Refill: 0  4. Aortic atherosclerosis ([place]) Patient with history of aortic atherosclerosis.  Patient will continue enteric-coated aspirin 81 mg daily.  5. Taking medication for chronic disease Patient on statin and will check hepatic function panel to rule out hepatotoxicity. - Hepatic Function Panel (6)

## 2018-10-18 LAB — HEPATIC FUNCTION PANEL (6)
ALT: 4 IU/L (ref 0–44)
AST: 13 IU/L (ref 0–40)
Alkaline Phosphatase: 110 IU/L (ref 39–117)
Bilirubin Total: 0.5 mg/dL (ref 0.0–1.2)
Bilirubin, Direct: 0.13 mg/dL (ref 0.00–0.40)

## 2018-10-18 LAB — RENAL FUNCTION PANEL
Albumin: 4.8 g/dL (ref 3.8–4.8)
BUN/Creatinine Ratio: 14 (ref 10–24)
BUN: 14 mg/dL (ref 8–27)
CO2: 23 mmol/L (ref 20–29)
Calcium: 9.9 mg/dL (ref 8.6–10.2)
Chloride: 95 mmol/L — ABNORMAL LOW (ref 96–106)
Creatinine, Ser: 0.97 mg/dL (ref 0.76–1.27)
GFR calc Af Amer: 93 mL/min/{1.73_m2} (ref >59)
GFR calc non Af Amer: 80 mL/min/{1.73_m2} (ref >59)
Glucose: 100 mg/dL — ABNORMAL HIGH (ref 65–99)
Phosphorus: 4.3 mg/dL — ABNORMAL HIGH (ref 2.8–4.1)
Potassium: 4.6 mmol/L (ref 3.5–5.2)
Sodium: 135 mmol/L (ref 134–144)

## 2018-10-18 LAB — LIPID PANEL WITH LDL/HDL RATIO
Cholesterol, Total: 163 mg/dL (ref 100–199)
HDL: 53 mg/dL (ref >39)
LDL Calculated: 93 mg/dL (ref 0–99)
LDl/HDL Ratio: 1.8 ratio (ref 0.0–3.6)
Triglycerides: 87 mg/dL (ref 0–149)
VLDL Cholesterol Cal: 17 mg/dL (ref 5–40)

## 2018-10-19 DIAGNOSIS — R634 Abnormal weight loss: Secondary | ICD-10-CM | POA: Diagnosis not present

## 2018-10-19 DIAGNOSIS — R2689 Other abnormalities of gait and mobility: Secondary | ICD-10-CM | POA: Diagnosis not present

## 2018-10-19 DIAGNOSIS — G3185 Corticobasal degeneration: Secondary | ICD-10-CM | POA: Diagnosis not present

## 2018-10-22 DIAGNOSIS — G219 Secondary parkinsonism, unspecified: Secondary | ICD-10-CM | POA: Diagnosis not present

## 2018-10-22 DIAGNOSIS — G300 Alzheimer's disease with early onset: Secondary | ICD-10-CM | POA: Diagnosis not present

## 2018-10-22 DIAGNOSIS — G3185 Corticobasal degeneration: Secondary | ICD-10-CM | POA: Diagnosis not present

## 2018-10-22 DIAGNOSIS — Z7982 Long term (current) use of aspirin: Secondary | ICD-10-CM | POA: Diagnosis not present

## 2018-10-22 DIAGNOSIS — I10 Essential (primary) hypertension: Secondary | ICD-10-CM | POA: Diagnosis not present

## 2018-10-22 DIAGNOSIS — R2689 Other abnormalities of gait and mobility: Secondary | ICD-10-CM | POA: Diagnosis not present

## 2018-10-22 DIAGNOSIS — R482 Apraxia: Secondary | ICD-10-CM | POA: Diagnosis not present

## 2018-10-22 DIAGNOSIS — F329 Major depressive disorder, single episode, unspecified: Secondary | ICD-10-CM | POA: Diagnosis not present

## 2018-10-22 DIAGNOSIS — F0280 Dementia in other diseases classified elsewhere without behavioral disturbance: Secondary | ICD-10-CM | POA: Diagnosis not present

## 2018-10-22 DIAGNOSIS — E785 Hyperlipidemia, unspecified: Secondary | ICD-10-CM | POA: Diagnosis not present

## 2018-10-22 DIAGNOSIS — M1990 Unspecified osteoarthritis, unspecified site: Secondary | ICD-10-CM | POA: Diagnosis not present

## 2018-10-22 DIAGNOSIS — E538 Deficiency of other specified B group vitamins: Secondary | ICD-10-CM | POA: Diagnosis not present

## 2018-10-26 DIAGNOSIS — F0280 Dementia in other diseases classified elsewhere without behavioral disturbance: Secondary | ICD-10-CM | POA: Diagnosis not present

## 2018-10-26 DIAGNOSIS — R482 Apraxia: Secondary | ICD-10-CM | POA: Diagnosis not present

## 2018-10-26 DIAGNOSIS — G219 Secondary parkinsonism, unspecified: Secondary | ICD-10-CM | POA: Diagnosis not present

## 2018-10-26 DIAGNOSIS — G3185 Corticobasal degeneration: Secondary | ICD-10-CM | POA: Diagnosis not present

## 2018-10-26 DIAGNOSIS — G300 Alzheimer's disease with early onset: Secondary | ICD-10-CM | POA: Diagnosis not present

## 2018-10-26 DIAGNOSIS — I10 Essential (primary) hypertension: Secondary | ICD-10-CM | POA: Diagnosis not present

## 2018-10-31 DIAGNOSIS — R482 Apraxia: Secondary | ICD-10-CM | POA: Diagnosis not present

## 2018-10-31 DIAGNOSIS — I10 Essential (primary) hypertension: Secondary | ICD-10-CM | POA: Diagnosis not present

## 2018-10-31 DIAGNOSIS — G219 Secondary parkinsonism, unspecified: Secondary | ICD-10-CM | POA: Diagnosis not present

## 2018-10-31 DIAGNOSIS — F0280 Dementia in other diseases classified elsewhere without behavioral disturbance: Secondary | ICD-10-CM | POA: Diagnosis not present

## 2018-10-31 DIAGNOSIS — G300 Alzheimer's disease with early onset: Secondary | ICD-10-CM | POA: Diagnosis not present

## 2018-10-31 DIAGNOSIS — G3185 Corticobasal degeneration: Secondary | ICD-10-CM | POA: Diagnosis not present

## 2018-11-03 ENCOUNTER — Ambulatory Visit: Payer: Medicare Other | Admitting: Family Medicine

## 2018-11-08 DIAGNOSIS — E890 Postprocedural hypothyroidism: Secondary | ICD-10-CM | POA: Diagnosis not present

## 2018-11-08 DIAGNOSIS — C73 Malignant neoplasm of thyroid gland: Secondary | ICD-10-CM | POA: Diagnosis not present

## 2018-11-08 DIAGNOSIS — M810 Age-related osteoporosis without current pathological fracture: Secondary | ICD-10-CM | POA: Diagnosis not present

## 2018-11-10 DIAGNOSIS — G300 Alzheimer's disease with early onset: Secondary | ICD-10-CM | POA: Diagnosis not present

## 2018-11-10 DIAGNOSIS — G219 Secondary parkinsonism, unspecified: Secondary | ICD-10-CM | POA: Diagnosis not present

## 2018-11-10 DIAGNOSIS — G3185 Corticobasal degeneration: Secondary | ICD-10-CM | POA: Diagnosis not present

## 2018-11-10 DIAGNOSIS — R482 Apraxia: Secondary | ICD-10-CM | POA: Diagnosis not present

## 2018-11-10 DIAGNOSIS — I10 Essential (primary) hypertension: Secondary | ICD-10-CM | POA: Diagnosis not present

## 2018-11-10 DIAGNOSIS — F0280 Dementia in other diseases classified elsewhere without behavioral disturbance: Secondary | ICD-10-CM | POA: Diagnosis not present

## 2018-11-16 DIAGNOSIS — G3185 Corticobasal degeneration: Secondary | ICD-10-CM | POA: Diagnosis not present

## 2018-11-16 DIAGNOSIS — R482 Apraxia: Secondary | ICD-10-CM | POA: Diagnosis not present

## 2018-11-16 DIAGNOSIS — G219 Secondary parkinsonism, unspecified: Secondary | ICD-10-CM | POA: Diagnosis not present

## 2018-11-16 DIAGNOSIS — G300 Alzheimer's disease with early onset: Secondary | ICD-10-CM | POA: Diagnosis not present

## 2018-11-16 DIAGNOSIS — F0280 Dementia in other diseases classified elsewhere without behavioral disturbance: Secondary | ICD-10-CM | POA: Diagnosis not present

## 2018-11-16 DIAGNOSIS — I10 Essential (primary) hypertension: Secondary | ICD-10-CM | POA: Diagnosis not present

## 2018-11-17 DIAGNOSIS — G300 Alzheimer's disease with early onset: Secondary | ICD-10-CM | POA: Diagnosis not present

## 2018-11-17 DIAGNOSIS — F0280 Dementia in other diseases classified elsewhere without behavioral disturbance: Secondary | ICD-10-CM | POA: Diagnosis not present

## 2018-11-17 DIAGNOSIS — G3185 Corticobasal degeneration: Secondary | ICD-10-CM | POA: Diagnosis not present

## 2018-11-17 DIAGNOSIS — R482 Apraxia: Secondary | ICD-10-CM | POA: Diagnosis not present

## 2018-11-17 DIAGNOSIS — G219 Secondary parkinsonism, unspecified: Secondary | ICD-10-CM | POA: Diagnosis not present

## 2018-11-17 DIAGNOSIS — I10 Essential (primary) hypertension: Secondary | ICD-10-CM | POA: Diagnosis not present

## 2018-11-21 DIAGNOSIS — G219 Secondary parkinsonism, unspecified: Secondary | ICD-10-CM | POA: Diagnosis not present

## 2018-11-21 DIAGNOSIS — E538 Deficiency of other specified B group vitamins: Secondary | ICD-10-CM | POA: Diagnosis not present

## 2018-11-21 DIAGNOSIS — F0280 Dementia in other diseases classified elsewhere without behavioral disturbance: Secondary | ICD-10-CM | POA: Diagnosis not present

## 2018-11-21 DIAGNOSIS — R482 Apraxia: Secondary | ICD-10-CM | POA: Diagnosis not present

## 2018-11-21 DIAGNOSIS — F329 Major depressive disorder, single episode, unspecified: Secondary | ICD-10-CM | POA: Diagnosis not present

## 2018-11-21 DIAGNOSIS — M1990 Unspecified osteoarthritis, unspecified site: Secondary | ICD-10-CM | POA: Diagnosis not present

## 2018-11-21 DIAGNOSIS — G3185 Corticobasal degeneration: Secondary | ICD-10-CM | POA: Diagnosis not present

## 2018-11-21 DIAGNOSIS — G300 Alzheimer's disease with early onset: Secondary | ICD-10-CM | POA: Diagnosis not present

## 2018-11-21 DIAGNOSIS — Z7982 Long term (current) use of aspirin: Secondary | ICD-10-CM | POA: Diagnosis not present

## 2018-11-21 DIAGNOSIS — E785 Hyperlipidemia, unspecified: Secondary | ICD-10-CM | POA: Diagnosis not present

## 2018-11-21 DIAGNOSIS — R2689 Other abnormalities of gait and mobility: Secondary | ICD-10-CM | POA: Diagnosis not present

## 2018-11-21 DIAGNOSIS — I10 Essential (primary) hypertension: Secondary | ICD-10-CM | POA: Diagnosis not present

## 2018-11-23 DIAGNOSIS — G219 Secondary parkinsonism, unspecified: Secondary | ICD-10-CM | POA: Diagnosis not present

## 2018-11-23 DIAGNOSIS — R482 Apraxia: Secondary | ICD-10-CM | POA: Diagnosis not present

## 2018-11-23 DIAGNOSIS — I10 Essential (primary) hypertension: Secondary | ICD-10-CM | POA: Diagnosis not present

## 2018-11-23 DIAGNOSIS — G300 Alzheimer's disease with early onset: Secondary | ICD-10-CM | POA: Diagnosis not present

## 2018-11-23 DIAGNOSIS — F0280 Dementia in other diseases classified elsewhere without behavioral disturbance: Secondary | ICD-10-CM | POA: Diagnosis not present

## 2018-11-23 DIAGNOSIS — G3185 Corticobasal degeneration: Secondary | ICD-10-CM | POA: Diagnosis not present

## 2018-11-28 DIAGNOSIS — G219 Secondary parkinsonism, unspecified: Secondary | ICD-10-CM | POA: Diagnosis not present

## 2018-11-28 DIAGNOSIS — G3185 Corticobasal degeneration: Secondary | ICD-10-CM | POA: Diagnosis not present

## 2018-11-28 DIAGNOSIS — R482 Apraxia: Secondary | ICD-10-CM | POA: Diagnosis not present

## 2018-11-28 DIAGNOSIS — I10 Essential (primary) hypertension: Secondary | ICD-10-CM | POA: Diagnosis not present

## 2018-11-28 DIAGNOSIS — F0280 Dementia in other diseases classified elsewhere without behavioral disturbance: Secondary | ICD-10-CM | POA: Diagnosis not present

## 2018-11-28 DIAGNOSIS — G300 Alzheimer's disease with early onset: Secondary | ICD-10-CM | POA: Diagnosis not present

## 2018-12-01 DIAGNOSIS — I10 Essential (primary) hypertension: Secondary | ICD-10-CM | POA: Diagnosis not present

## 2018-12-01 DIAGNOSIS — G219 Secondary parkinsonism, unspecified: Secondary | ICD-10-CM | POA: Diagnosis not present

## 2018-12-01 DIAGNOSIS — R482 Apraxia: Secondary | ICD-10-CM | POA: Diagnosis not present

## 2018-12-01 DIAGNOSIS — G300 Alzheimer's disease with early onset: Secondary | ICD-10-CM | POA: Diagnosis not present

## 2018-12-01 DIAGNOSIS — G3185 Corticobasal degeneration: Secondary | ICD-10-CM | POA: Diagnosis not present

## 2018-12-01 DIAGNOSIS — F0280 Dementia in other diseases classified elsewhere without behavioral disturbance: Secondary | ICD-10-CM | POA: Diagnosis not present

## 2018-12-07 DIAGNOSIS — I10 Essential (primary) hypertension: Secondary | ICD-10-CM | POA: Diagnosis not present

## 2018-12-07 DIAGNOSIS — R482 Apraxia: Secondary | ICD-10-CM | POA: Diagnosis not present

## 2018-12-07 DIAGNOSIS — G219 Secondary parkinsonism, unspecified: Secondary | ICD-10-CM | POA: Diagnosis not present

## 2018-12-07 DIAGNOSIS — R7989 Other specified abnormal findings of blood chemistry: Secondary | ICD-10-CM | POA: Diagnosis not present

## 2018-12-07 DIAGNOSIS — R948 Abnormal results of function studies of other organs and systems: Secondary | ICD-10-CM | POA: Diagnosis not present

## 2018-12-07 DIAGNOSIS — G3185 Corticobasal degeneration: Secondary | ICD-10-CM | POA: Diagnosis not present

## 2018-12-07 DIAGNOSIS — F0280 Dementia in other diseases classified elsewhere without behavioral disturbance: Secondary | ICD-10-CM | POA: Diagnosis not present

## 2018-12-07 DIAGNOSIS — G300 Alzheimer's disease with early onset: Secondary | ICD-10-CM | POA: Diagnosis not present

## 2018-12-14 DIAGNOSIS — F0280 Dementia in other diseases classified elsewhere without behavioral disturbance: Secondary | ICD-10-CM | POA: Diagnosis not present

## 2018-12-14 DIAGNOSIS — G300 Alzheimer's disease with early onset: Secondary | ICD-10-CM | POA: Diagnosis not present

## 2018-12-14 DIAGNOSIS — G3185 Corticobasal degeneration: Secondary | ICD-10-CM | POA: Diagnosis not present

## 2018-12-14 DIAGNOSIS — G219 Secondary parkinsonism, unspecified: Secondary | ICD-10-CM | POA: Diagnosis not present

## 2018-12-14 DIAGNOSIS — I10 Essential (primary) hypertension: Secondary | ICD-10-CM | POA: Diagnosis not present

## 2018-12-14 DIAGNOSIS — R482 Apraxia: Secondary | ICD-10-CM | POA: Diagnosis not present

## 2018-12-22 DIAGNOSIS — M810 Age-related osteoporosis without current pathological fracture: Secondary | ICD-10-CM | POA: Diagnosis not present

## 2018-12-29 DIAGNOSIS — R7989 Other specified abnormal findings of blood chemistry: Secondary | ICD-10-CM | POA: Diagnosis not present

## 2019-01-09 ENCOUNTER — Other Ambulatory Visit: Payer: Self-pay | Admitting: Family Medicine

## 2019-01-09 DIAGNOSIS — F330 Major depressive disorder, recurrent, mild: Secondary | ICD-10-CM

## 2019-01-17 DIAGNOSIS — Z23 Encounter for immunization: Secondary | ICD-10-CM | POA: Diagnosis not present

## 2019-02-19 DIAGNOSIS — G3185 Corticobasal degeneration: Secondary | ICD-10-CM | POA: Diagnosis not present

## 2019-02-19 DIAGNOSIS — R2689 Other abnormalities of gait and mobility: Secondary | ICD-10-CM | POA: Diagnosis not present

## 2019-03-16 ENCOUNTER — Other Ambulatory Visit: Payer: Self-pay | Admitting: Family Medicine

## 2019-03-16 DIAGNOSIS — F330 Major depressive disorder, recurrent, mild: Secondary | ICD-10-CM

## 2019-03-28 ENCOUNTER — Ambulatory Visit (INDEPENDENT_AMBULATORY_CARE_PROVIDER_SITE_OTHER): Payer: Medicare Other

## 2019-03-28 DIAGNOSIS — Z0000 Encounter for general adult medical examination without abnormal findings: Secondary | ICD-10-CM

## 2019-03-28 NOTE — Progress Notes (Signed)
Subjective:   Travis Robertson is a 68 y.o. male who presents for an Initial Medicare Annual Wellness Visit.  Virtual Visit via Telephone Note  I connected with Travis Robertson on 03/28/19 at 11:20 AM EST by telephone and verified that I am speaking with the correct person using two identifiers.  Medicare Annual Wellness visit completed telephonically due to Covid-19 pandemic.   Location: Patient: home Provider: office   I discussed the limitations, risks, security and privacy concerns of performing an evaluation and management service by telephone and the availability of in person appointments. The patient expressed understanding and agreed to proceed.  Some vital signs may be absent or patient reported.   [name], LPN    Review of Systems   Cardiac Risk Factors include: advanced age (>33men, >61 women);dyslipidemia;male gender;hypertension    Objective:    There were no vitals filed for this visit. There is no height or weight on file to calculate BMI.  Advanced Directives 03/28/2019 07/15/2017 03/28/2017 12/31/2014  Does Patient Have a Medical Advance Directive? Yes Yes Yes No  Type of Paramedic of [place];Living will [place];Living will [place];Living will -  Does patient want to make changes to medical advance directive? - No - Patient declined - -  Copy of [place] in Chart? Yes - validated most recent copy scanned in chart (See row information) Yes - -    Current Medications (verified) Outpatient Encounter Medications as of 03/28/2019  Medication Sig  . aspirin 81 MG tablet Take 81 mg by mouth daily.  Marland Kitchen atorvastatin (LIPITOR) 10 MG tablet Take 1 tablet (10 mg total) by mouth daily.  . Calcium Carb-Cholecalciferol 600-800 MG-UNIT TABS   . carbidopa-levodopa (SINEMET) 25-100 MG tablet Take 3 tablets by mouth 3 (three) times daily.  Marland Kitchen levothyroxine (SYNTHROID,  LEVOTHROID) 100 MCG tablet Take 100 mcg by mouth daily. Dr [name]  . losartan-hydrochlorothiazide (HYZAAR) 50-12.5 MG tablet Take 1 tablet by mouth daily.  . metoprolol succinate (TOPROL-XL) 100 MG 24 hr tablet Take with or immediately following a meal.  . Polyethylene Glycol 3350 (PEG 3350) 17 GM/SCOOP POWD Take by mouth. PRN only  . sertraline (ZOLOFT) 50 MG tablet TAKE 1 TABLET DAILY  . testosterone cypionate (DEPOTESTOSTERONE CYPIONATE) 200 MG/ML injection Inject 200 mg into the muscle every 14 (fourteen) days.  . Vitamin D, Ergocalciferol, (DRISDOL) 1.25 MG (50000 UT) CAPS capsule Take 50,000 Units by mouth once a week.  . [DISCONTINUED] etodolac (LODINE) 400 MG tablet TAKE 1 TABLET BY MOUTH TWICE A DAY   No facility-administered encounter medications on file as of 03/28/2019.     Allergies (verified) Patient has no known allergies.   History: Past Medical History:  Diagnosis Date  . Arthritis    hands  . Corticobasal syndrome ([place])   . Hyperlipidemia   . Hypertension   . Impingement syndrome of shoulder   . Parkinson disease [facility])    Past Surgical History:  Procedure Laterality Date  . APPENDECTOMY    . CATARACT EXTRACTION Bilateral   . COLONOSCOPY  01/24/2012   cleared for 5 yrs- [facility]  . COLONOSCOPY WITH PROPOFOL N/A 07/15/2017   Procedure: COLONOSCOPY WITH PROPOFOL;  Surgeon: Lucilla Lame, MD;  Location: [place];  Service: Endoscopy;  Laterality: N/A;  . POLYPECTOMY  07/15/2017   Procedure: POLYPECTOMY INTESTINAL;  Surgeon: Lucilla Lame, MD;  Location: [place];  Service: Endoscopy;;  Ascending colon polyp Sigmoid  colon polyp x 2 Rectal polyp x 2  . RETINAL DETACHMENT SURGERY Bilateral    Family History  Problem Relation Age of Onset  . Dementia Mother   . Stroke Father    Social History   Socioeconomic History  . Marital status: Married    Spouse name: Not on file  . Number of children: 2  . Years of education: Not on file  .  Highest education level: Bachelor's degree (e.g., BA, AB, BS)  Occupational History  . Occupation: Retired  Scientific laboratory technician  . Financial resource strain: Not hard at all  . Food insecurity    Worry: Never true    Inability: Never true  . Transportation needs    Medical: No    Non-medical: No  Tobacco Use  . Smoking status: Former Smoker    Packs/day: 1.50    Years: 40.00    Pack years: 60.00    Types: Cigarettes    Quit date: 07/2015    Years since quitting: 3.6  . Smokeless tobacco: Never Used  . Tobacco comment: smoking cessation materials not required  Substance and Sexual Activity  . Alcohol use: Not Currently    Alcohol/week: 14.0 standard drinks    Types: 14 Cans of beer per week  . Drug use: No  . Sexual activity: Never  Lifestyle  . Physical activity    Days per week: 0 days    Minutes per session: 0 min  . Stress: Not at all  Relationships  . Social Herbalist on phone: Patient refused    Gets together: Patient refused    Attends religious service: Patient refused    Active member of club or organization: Patient refused    Attends meetings of clubs or organizations: Patient refused    Relationship status: Married  Other Topics Concern  . Not on file  Social History Narrative  . Not on file   Tobacco Counseling Counseling given: Not Answered Comment: smoking cessation materials not required   Clinical Intake:  Pre-visit preparation completed: Yes  Pain : No/denies pain     Nutritional Risks: None Diabetes: No  How often do you need to have someone help you when you read instructions, pamphlets, or other written materials from your doctor or pharmacy?: 1 - Never  Interpreter Needed?: No  Information entered by :: [name] LPN  Activities of Daily Living In your present state of health, do you have any difficulty performing the following activities: 03/28/2019  Hearing? N  Comment declines hearing aids  Vision? N  Difficulty  concentrating or making decisions? N  Walking or climbing stairs? N  Dressing or bathing? Y  Comment needs assistance  Doing errands, shopping? Y  Preparing Food and eating ? N  Using the Toilet? N  In the past six months, have you accidently leaked urine? N  Do you have problems with loss of bowel control? N  Managing your Medications? N  Managing your Finances? N  Housekeeping or managing your Housekeeping? Y  Some recent data might be hidden     Immunizations and Health Maintenance Immunization History  Administered Date(s) Administered  . Fluad Quad(high Dose 65+) 01/17/2019  . Influenza, High Dose Seasonal PF 01/18/2017, 02/14/2018  . Influenza,inj,Quad PF,6+ Mos 12/31/2014  . Influenza-Unspecified 01/17/2017, 01/17/2019  . Pneumococcal Conjugate-13 01/27/2016  . Pneumococcal Polysaccharide-23 02/25/2017  . Tdap 03/08/2016   There are no preventive care reminders to display for this patient.  Patient Care Team: Otilio Miu  C, MD as PCP - General (Family Medicine) Sharolyn Douglas, MD as Consulting Physician (Neurology) Leim Fabry, MD as Consulting Physician (Orthopedic Surgery)  Indicate any recent Medical Services you may have received from other than [facility] providers in the past year (date may be approximate).    Assessment:   This is a routine wellness examination for Ndrew.  Hearing/Vision screen  Hearing Screening   125Hz  250Hz  500Hz  1000Hz  2000Hz  3000Hz  4000Hz  6000Hz  8000Hz   Right ear:           Left ear:           Comments: Pt denies hearing difficulty  Vision Screening Comments: Annual vision screenings with Dr. Florinda Marker  Dietary issues and exercise activities discussed: Current Exercise Habits: The patient does not participate in regular exercise at present, Exercise limited by: orthopedic condition(s);neurologic condition(s)  Goals    . DIET - INCREASE WATER INTAKE     Recommend to drink at least 6-8 8oz glasses of water per day.       Depression Screen PHQ 2/9 Scores 03/28/2019 10/17/2018 08/08/2018 07/04/2018  PHQ - 2 Score 0 0 0 0  PHQ- 9 Score - 6 0 7    Fall Risk Fall Risk  03/28/2019 07/04/2018 07/04/2017 04/08/2017 09/01/2016  Falls in the past year? 1 1 Yes Yes No  Number falls in past yr: 1 1 2  or more 1 -  Injury with Fall? 1 1 Yes Yes -  Comment - - R shoulder fracture and broke L ankle broke L) ankle -  Risk Factor Category  - - High Fall Risk - -  Comment - - corticobasal syndome - -  Risk for fall due to : Impaired mobility;Impaired balance/gait Impaired balance/gait History of fall(s);Impaired balance/gait;Impaired vision;Medication side effect - -  Risk for fall due to: Comment - - wears eyeglasses; corticobasal syndrome - -  Follow up Falls prevention discussed Follow up appointment;Falls prevention discussed Falls evaluation completed;Education provided;Falls prevention discussed Falls evaluation completed -    FALL RISK PREVENTION PERTAINING TO THE HOME:  Any stairs in or around the home? Yes  If so, do they handrails? Yes   Home free of loose throw rugs in walkways, pet beds, electrical cords, etc? Yes  Adequate lighting in your home to reduce risk of falls? Yes   ASSISTIVE DEVICES UTILIZED TO PREVENT FALLS:  Life alert? No  Use of a cane, walker or w/c? Yes  Grab bars in the bathroom? Yes  Shower chair or bench in shower? Yes  Elevated toilet seat or a handicapped toilet? No   DME ORDERS:  DME order needed?  No   TIMED UP AND GO:  Was the test performed? No . Telephonic visit.   Education: Fall risk prevention has been discussed.  Intervention(s) required? No    Cognitive Function:     6CIT Screen 03/28/2019 07/04/2017  What Year? 0 points 0 points  What month? 0 points 0 points  What time? 0 points 0 points  Count back from 20 0 points 0 points  Months in reverse 4 points 0 points  Repeat phrase 0 points 2 points  Total Score 4 2    Screening Tests Health Maintenance  Topic  Date Due  . COLONOSCOPY  07/16/2022  . TETANUS/TDAP  03/08/2026  . INFLUENZA VACCINE  Completed  . Hepatitis C Screening  Completed  . PNA vac Low Risk Adult  Completed    Qualifies for Shingles Vaccine? Yes  . Due for Shingrix. Education has been  provided regarding the importance of this vaccine. Pt has been advised to call insurance company to determine out of pocket expense. Advised may also receive vaccine at local pharmacy or Health Dept. Verbalized acceptance and understanding.  Tdap: Up to date.  Flu Vaccine: Up to date  Pneumococcal Vaccine: Up to date  Cancer Screenings:  Colorectal Screening: Completed 07/15/17. Repeat every 5 years;  Lung Cancer Screening: (Low Dose CT Chest recommended if Age 72-80 years, 30 pack-year currently smoking OR have quit w/in 15years.) does qualify. Pt declines at this time.   Additional Screening:  Hepatitis C Screening: does qualify; Completed 09/01/16  Vision Screening: Recommended annual ophthalmology exams for early detection of glaucoma and other disorders of the eye. Is the patient up to date with their annual eye exam?  Yes  Who is the provider or what is the name of the office in which the pt attends annual eye exams? Dr. Florinda Marker   Dental Screening: Recommended annual dental exams for proper oral hygiene  Community Resource Referral:  CRR required this visit?  No        Plan:    I have personally reviewed and addressed the Medicare Annual Wellness questionnaire and have noted the following in the patient's chart:  A. Medical and social history B. Use of alcohol, tobacco or illicit drugs  C. Current medications and supplements D. Functional ability and status E.  Nutritional status F.  Physical activity G. Advance directives H. List of other physicians I.  Hospitalizations, surgeries, and ER visits in previous 12 months J.  [place] such as hearing and vision if needed, cognitive and depression L.  Referrals and appointments   In addition, I have reviewed and discussed with patient certain preventive protocols, quality metrics, and best practice recommendations. A written personalized care plan for preventive services as well as general preventive health recommendations were provided to patient.   Signed,  [name], LPN Nurse Health Advisor   Nurse Notes: none

## 2019-03-28 NOTE — Patient Instructions (Signed)
Mr. Travis Robertson , Thank you for taking time to come for your Medicare Wellness Visit. I appreciate your ongoing commitment to your health goals. Please review the following plan we discussed and let me know if I can assist you in the future.   Screening recommendations/referrals: Colonoscopy: done 07/15/17. Repeat in 2024. Recommended yearly ophthalmology/optometry visit for glaucoma screening and checkup Recommended yearly dental visit for hygiene and checkup  Vaccinations: Influenza vaccine: done 01/17/19 Pneumococcal vaccine: done 02/25/17 Tdap vaccine: done 03/08/16 Shingles vaccine: Shingrix discussed. Please contact your pharmacy for coverage information.   Conditions/risks identified: Recommend light physical activity as tolerated  Next appointment: Please follow up in one year for your Medicare Annual Wellness visit.    Preventive Care 68 Years and Older, Male Preventive care refers to lifestyle choices and visits with your health care provider that can promote health and wellness. What does preventive care include?  A yearly physical exam. This is also called an annual well check.  Dental exams once or twice a year.  Routine eye exams. Ask your health care provider how often you should have your eyes checked.  Personal lifestyle choices, including:  Daily care of your teeth and gums.  Regular physical activity.  Eating a healthy diet.  Avoiding tobacco and drug use.  Limiting alcohol use.  Practicing safe sex.  Taking low doses of aspirin every day.  Taking vitamin and mineral supplements as recommended by your health care provider. What happens during an annual well check? The services and screenings done by your health care provider during your annual well check will depend on your age, overall health, lifestyle risk factors, and family history of disease. Counseling  Your health care provider may ask you questions about your:  Alcohol use.  Tobacco use.   Drug use.  Emotional well-being.  Home and relationship well-being.  Sexual activity.  Eating habits.  History of falls.  Memory and ability to understand (cognition).  Work and work Statistician. Screening  You may have the following tests or measurements:  Height, weight, and BMI.  Blood pressure.  Lipid and cholesterol levels. These may be checked every 5 years, or more frequently if you are over 75 years old.  Skin check.  Lung cancer screening. You may have this screening every year starting at age 30 if you have a 30-pack-year history of smoking and currently smoke or have quit within the past 15 years.  Fecal occult blood test (FOBT) of the stool. You may have this test every year starting at age 58.  Flexible sigmoidoscopy or colonoscopy. You may have a sigmoidoscopy every 5 years or a colonoscopy every 10 years starting at age 68.  Prostate cancer screening. Recommendations will vary depending on your family history and other risks.  Hepatitis C blood test.  Hepatitis B blood test.  Sexually transmitted disease (STD) testing.  Diabetes screening. This is done by checking your blood sugar (glucose) after you have not eaten for a while (fasting). You may have this done every 1-3 years.  Abdominal aortic aneurysm (AAA) screening. You may need this if you are a current or former smoker.  Osteoporosis. You may be screened starting at age 39 if you are at high risk. Talk with your health care provider about your test results, treatment options, and if necessary, the need for more tests. Vaccines  Your health care provider may recommend certain vaccines, such as:  Influenza vaccine. This is recommended every year.  Tetanus, diphtheria, and acellular pertussis (Tdap, Td)  vaccine. You may need a Td booster every 10 years.  Zoster vaccine. You may need this after age 68.  Pneumococcal 13-valent conjugate (PCV13) vaccine. One dose is recommended after age 68.   Pneumococcal polysaccharide (PPSV23) vaccine. One dose is recommended after age 68. Talk to your health care provider about which screenings and vaccines you need and how often you need them. This information is not intended to replace advice given to you by your health care provider. Make sure you discuss any questions you have with your health care provider. Document Released: 05/09/2015 Document Revised: 12/31/2015 Document Reviewed: 02/11/2015 Elsevier Interactive Patient Education  2017 [place] Prevention in the Home Falls can cause injuries. They can happen to people of all ages. There are many things you can do to make your home safe and to help prevent falls. What can I do on the outside of my home?  Regularly fix the edges of walkways and driveways and fix any cracks.  Remove anything that might make you trip as you walk through a door, such as a raised step or threshold.  Trim any bushes or trees on the path to your home.  Use bright outdoor lighting.  Clear any walking paths of anything that might make someone trip, such as rocks or tools.  Regularly check to see if handrails are loose or broken. Make sure that both sides of any steps have handrails.  Any raised decks and porches should have guardrails on the edges.  Have any leaves, snow, or ice cleared regularly.  Use sand or salt on walking paths during winter.  Clean up any spills in your garage right away. This includes oil or grease spills. What can I do in the bathroom?  Use night lights.  Install grab bars by the toilet and in the tub and shower. Do not use towel bars as grab bars.  Use non-skid mats or decals in the tub or shower.  If you need to sit down in the shower, use a plastic, non-slip stool.  Keep the floor dry. Clean up any water that spills on the floor as soon as it happens.  Remove soap buildup in the tub or shower regularly.  Attach bath mats securely with double-sided non-slip  rug tape.  Do not have throw rugs and other things on the floor that can make you trip. What can I do in the bedroom?  Use night lights.  Make sure that you have a light by your bed that is easy to reach.  Do not use any sheets or blankets that are too big for your bed. They should not hang down onto the floor.  Have a firm chair that has side arms. You can use this for support while you get dressed.  Do not have throw rugs and other things on the floor that can make you trip. What can I do in the kitchen?  Clean up any spills right away.  Avoid walking on wet floors.  Keep items that you use a lot in easy-to-reach places.  If you need to reach something above you, use a strong step stool that has a grab bar.  Keep electrical cords out of the way.  Do not use floor polish or wax that makes floors slippery. If you must use wax, use non-skid floor wax.  Do not have throw rugs and other things on the floor that can make you trip. What can I do with my stairs?  Do not leave  any items on the stairs.  Make sure that there are handrails on both sides of the stairs and use them. Fix handrails that are broken or loose. Make sure that handrails are as long as the stairways.  Check any carpeting to make sure that it is firmly attached to the stairs. Fix any carpet that is loose or worn.  Avoid having throw rugs at the top or bottom of the stairs. If you do have throw rugs, attach them to the floor with carpet tape.  Make sure that you have a light switch at the top of the stairs and the bottom of the stairs. If you do not have them, ask someone to add them for you. What else can I do to help prevent falls?  Wear shoes that:  Do not have high heels.  Have rubber bottoms.  Are comfortable and fit you well.  Are closed at the toe. Do not wear sandals.  If you use a stepladder:  Make sure that it is fully opened. Do not climb a closed stepladder.  Make sure that both sides of  the stepladder are locked into place.  Ask someone to hold it for you, if possible.  Clearly mark and make sure that you can see:  Any grab bars or handrails.  First and last steps.  Where the edge of each step is.  Use tools that help you move around (mobility aids) if they are needed. These include:  Canes.  Walkers.  Scooters.  Crutches.  Turn on the lights when you go into a dark area. Replace any light bulbs as soon as they burn out.  Set up your furniture so you have a clear path. Avoid moving your furniture around.  If any of your floors are uneven, fix them.  If there are any pets around you, be aware of where they are.  Review your medicines with your doctor. Some medicines can make you feel dizzy. This can increase your chance of falling. Ask your doctor what other things that you can do to help prevent falls. This information is not intended to replace advice given to you by your health care provider. Make sure you discuss any questions you have with your health care provider. Document Released: 02/06/2009 Document Revised: 09/18/2015 Document Reviewed: 05/17/2014 Elsevier Interactive Patient Education  2017 [employer].

## 2019-03-29 ENCOUNTER — Ambulatory Visit: Payer: Medicare Other | Admitting: Family Medicine

## 2019-04-02 ENCOUNTER — Other Ambulatory Visit: Payer: Self-pay

## 2019-04-02 ENCOUNTER — Encounter: Payer: Self-pay | Admitting: Family Medicine

## 2019-04-02 ENCOUNTER — Ambulatory Visit (INDEPENDENT_AMBULATORY_CARE_PROVIDER_SITE_OTHER): Payer: Medicare Other | Admitting: Family Medicine

## 2019-04-02 VITALS — BP 100/60 | HR 76 | Ht 68.0 in | Wt 160.0 lb

## 2019-04-02 DIAGNOSIS — F330 Major depressive disorder, recurrent, mild: Secondary | ICD-10-CM

## 2019-04-02 DIAGNOSIS — I10 Essential (primary) hypertension: Secondary | ICD-10-CM

## 2019-04-02 DIAGNOSIS — E785 Hyperlipidemia, unspecified: Secondary | ICD-10-CM | POA: Diagnosis not present

## 2019-04-02 MED ORDER — ATORVASTATIN CALCIUM 10 MG PO TABS: 10.0000 mg | ORAL_TABLET | Freq: Every day | ORAL | 1 refills | Status: DC

## 2019-04-02 MED ORDER — LOSARTAN POTASSIUM-HCTZ 50-12.5 MG PO TABS: 1.0000 | ORAL_TABLET | Freq: Every day | ORAL | 1 refills | Status: DC

## 2019-04-02 MED ORDER — SERTRALINE HCL 50 MG PO TABS: 50.0000 mg | ORAL_TABLET | Freq: Every day | ORAL | 1 refills | Status: DC

## 2019-04-02 NOTE — Progress Notes (Signed)
Date:  04/02/2019   Name:  Travis Robertson   DOB:  [birth year]   MRN:  [id number]   Chief Complaint: Hypertension, Hyperlipidemia, and Depression  Hypertension This is a chronic problem. The current episode started more than 1 year ago. The problem has been gradually improving since onset. The problem is controlled. Pertinent negatives include no anxiety, blurred vision, chest pain, headaches, malaise/fatigue, neck pain, orthopnea, palpitations, peripheral edema, PND, shortness of breath or sweats. There are no associated agents to hypertension. Risk factors for coronary artery disease include dyslipidemia, male gender and smoking/tobacco exposure. Past treatments include angiotensin blockers, diuretics and beta blockers. The current treatment provides significant improvement. There are no compliance problems.  There is no history of angina, kidney disease, CAD/MI, CVA, heart failure, left ventricular hypertrophy, PVD or retinopathy. There is no history of chronic renal disease, a hypertension causing med or renovascular disease.  Hyperlipidemia This is a chronic problem. The current episode started more than 1 year ago. The problem is controlled. Recent lipid tests were reviewed and are normal. Exacerbating diseases include hypothyroidism. He has no history of chronic renal disease, liver disease, obesity or nephrotic syndrome. There are no known factors aggravating his hyperlipidemia. Pertinent negatives include no chest pain, focal sensory loss, focal weakness, leg pain, myalgias or shortness of breath. Current antihyperlipidemic treatment includes statins. The current treatment provides moderate improvement of lipids. There are no compliance problems.  Risk factors for coronary artery disease include dyslipidemia and hypertension.  Depression        This is a chronic problem.  The current episode started more than 1 year ago.   The problem occurs daily.  The problem has been gradually improving  since onset.  Associated symptoms include no decreased concentration, no fatigue, no helplessness, no hopelessness, does not have insomnia, not irritable, no restlessness, no decreased interest, no appetite change, no body aches, no myalgias, no headaches, no indigestion, not sad and no suicidal ideas.  Past treatments include SSRIs - Selective serotonin reuptake inhibitors.  Past medical history includes hypothyroidism.     Pertinent negatives include no anxiety.   Lab Results  Component Value Date   CREATININE 0.97 10/17/2018   BUN 14 10/17/2018   NA 135 10/17/2018   K 4.6 10/17/2018   CL 95 (L) 10/17/2018   CO2 23 10/17/2018   Lab Results  Component Value Date   CHOL 163 10/17/2018   HDL 53 10/17/2018   LDLCALC 93 10/17/2018   TRIG 87 10/17/2018   CHOLHDL 2.8 09/01/2016   No results found for: TSH No results found for: HGBA1C   Review of Systems  Constitutional: Negative for appetite change, chills, fatigue, fever and malaise/fatigue.  HENT: Negative for drooling, ear discharge, ear pain and sore throat.   Eyes: Negative for blurred vision.  Respiratory: Negative for cough, choking, chest tightness, shortness of breath and wheezing.   Cardiovascular: Negative for chest pain, palpitations, orthopnea, leg swelling and PND.  Gastrointestinal: Negative for abdominal pain, blood in stool, constipation, diarrhea and nausea.  Endocrine: Negative for polydipsia.  Genitourinary: Negative for dysuria, frequency, hematuria and urgency.  Musculoskeletal: Negative for back pain, myalgias and neck pain.  Skin: Negative for rash.  Allergic/Immunologic: Negative for environmental allergies.  Neurological: Negative for dizziness, focal weakness and headaches.  Hematological: Does not bruise/bleed easily.  Psychiatric/Behavioral: Positive for depression. Negative for decreased concentration and suicidal ideas. The patient is not nervous/anxious and does not have insomnia.     Patient Active  Problem List   Diagnosis Date Noted  . Aortic atherosclerosis ([place]) 10/17/2018  . Postoperative hypothyroidism 04/24/2018  . Depression 11/11/2017  . Thyroid cancer ([place]) 08/24/2017  . Displaced fracture of acromial process 08/16/2017  . Personal history of colonic polyps   . Benign neoplasm of descending colon   . Polyp of sigmoid colon   . Rectal polyp   . Mild episode of recurrent major depressive disorder ([place]) 07/14/2017  . 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 Corticobasal syndrome ([place]) 04/21/2017  . Parkinson disease ([place]) 04/21/2017  . Loss of memory 12/23/2016  . Rapidly progressive weakness 10/28/2016  . Ataxia 10/12/2016  . B12 deficiency 10/12/2016  . Dizziness 10/12/2016  . Essential hypertension 12/31/2014  . Hyperlipidemia 12/31/2014    No Known Allergies  Past Surgical History:  Procedure Laterality Date  . APPENDECTOMY    . CATARACT EXTRACTION Bilateral   . COLONOSCOPY  01/24/2012   cleared for 5 yrs- [facility]  . COLONOSCOPY WITH PROPOFOL N/A 07/15/2017   Procedure: COLONOSCOPY WITH PROPOFOL;  Surgeon: Lucilla Lame, MD;  Location: [facility];  Service: Endoscopy;  Laterality: N/A;  . POLYPECTOMY  07/15/2017   Procedure: POLYPECTOMY INTESTINAL;  Surgeon: Lucilla Lame, MD;  Location: [facility];  Service: Endoscopy;;  Ascending colon polyp Sigmoid colon polyp x 2 Rectal polyp x 2  . RETINAL DETACHMENT SURGERY Bilateral     Social History   Tobacco Use  . Smoking status: Former Smoker    Packs/day: 1.50    Years: 40.00    Pack years: 60.00    Types: Cigarettes    Quit date: 07/2015    Years since quitting: 3.6  . Smokeless tobacco: Never Used  . Tobacco comment: smoking cessation materials not required  Substance Use Topics  . Alcohol use: Not Currently    Alcohol/week: 14.0 standard drinks    Types: 14 Cans of beer per week  . Drug use: No     Medication list has been reviewed and  updated.  Current Meds  Medication Sig  . aspirin 81 MG tablet Take 81 mg by mouth daily.  Marland Kitchen atorvastatin (LIPITOR) 10 MG tablet Take 1 tablet (10 mg total) by mouth daily.  . Calcium Carb-Cholecalciferol 600-800 MG-UNIT TABS   . carbidopa-levodopa (SINEMET) 25-100 MG tablet Take 3 tablets by mouth 3 (three) times daily.  Marland Kitchen levothyroxine (SYNTHROID, LEVOTHROID) 100 MCG tablet Take 100 mcg by mouth daily. Dr [name]  . losartan-hydrochlorothiazide (HYZAAR) 50-12.5 MG tablet Take 1 tablet by mouth daily.  . metoprolol succinate (TOPROL-XL) 100 MG 24 hr tablet Take with or immediately following a meal.  . Polyethylene Glycol 3350 (PEG 3350) 17 GM/SCOOP POWD Take by mouth. PRN only  . sertraline (ZOLOFT) 50 MG tablet TAKE 1 TABLET DAILY  . testosterone cypionate (DEPOTESTOSTERONE CYPIONATE) 200 MG/ML injection Inject 200 mg into the muscle every 14 (fourteen) days.  . Vitamin D, Ergocalciferol, (DRISDOL) 1.25 MG (50000 UT) CAPS capsule Take 50,000 Units by mouth once a week.    PHQ 2/9 Scores 04/02/2019 03/28/2019 10/17/2018 08/08/2018  PHQ - 2 Score 0 0 0 0  PHQ- 9 Score 0 - 6 0    BP Readings from Last 3 Encounters:  04/02/19 100/60  10/17/18 130/80  07/04/18 130/80    Physical Exam Vitals signs and nursing note reviewed.  Constitutional:      General: He is not irritable. HENT:     Head: Normocephalic.  Right Ear: Tympanic membrane and external ear normal.     Left Ear: Tympanic membrane and external ear normal.     Nose: Nose normal.  Eyes:     General: No scleral icterus.       Right eye: No discharge.        Left eye: No discharge.     Conjunctiva/sclera: Conjunctivae normal.     Pupils: Pupils are equal, round, and reactive to light.  Neck:     Musculoskeletal: Normal range of motion and neck supple.     Thyroid: No thyromegaly.     Vascular: No JVD.     Trachea: No tracheal deviation.  Cardiovascular:     Rate and Rhythm: Normal rate and regular rhythm.      Heart sounds: Normal heart sounds. No murmur. No friction rub. No gallop.   Pulmonary:     Effort: No respiratory distress.     Breath sounds: Normal breath sounds. No wheezing or rales.  Abdominal:     General: Bowel sounds are normal.     Palpations: Abdomen is soft. There is no mass.     Tenderness: There is no abdominal tenderness. There is no guarding or rebound.  Musculoskeletal: Normal range of motion.        General: No tenderness.  Lymphadenopathy:     Cervical: No cervical adenopathy.  Skin:    General: Skin is warm.     Coloration: Skin is not pale.     Findings: No bruising, erythema or rash.  Neurological:     Mental Status: He is alert and oriented to person, place, and time.     Cranial Nerves: No cranial nerve deficit.     Deep Tendon Reflexes: Reflexes are normal and symmetric.     Wt Readings from Last 3 Encounters:  04/02/19 160 lb (72.6 kg)  10/17/18 165 lb (74.8 kg)  08/08/18 165 lb (74.8 kg)    BP 100/60   Pulse 76   Ht 5\' 8"  (1.727 m)   Wt 160 lb (72.6 kg)   BMI 24.33 kg/m   Assessment and Plan:   1. Hyperlipidemia, unspecified hyperlipidemia type Chronic.  Controlled.  Stable.  Review of previous lipid panel in June was with an normal range.  Will continue atorvastatin 10 mg once a day. - atorvastatin (LIPITOR) 10 MG tablet; Take 1 tablet (10 mg total) by mouth daily.  Dispense: 90 tablet; Refill: 1  2. Essential hypertension Chronic.  Controlled.  Stable.  Will continue losartan hydrochlorothiazide 50-12.5 mg once a day.  Orthostatic hypotension has not been an issue and we will continue at the current dosing at the results noted today.  Review of previous renal panel was reviewed and we will wait until June 2021 will recheck - losartan-hydrochlorothiazide (HYZAAR) 50-12.5 MG tablet; Take 1 tablet by mouth daily.  Dispense: 90 tablet; Refill: 1  3. Mild episode of recurrent major depressive disorder (HCC) Chronic.  Controlled.  Very stable.  PHQ  0/anxiety Gad score 0.  We will continue sertraline 50 mg once a day.  Will recheck in 6 months. - sertraline (ZOLOFT) 50 MG tablet; Take 1 tablet (50 mg total) by mouth daily.  Dispense: 90 tablet; Refill: 1

## 2019-05-03 ENCOUNTER — Other Ambulatory Visit: Payer: Self-pay | Admitting: Family Medicine

## 2019-05-03 DIAGNOSIS — I10 Essential (primary) hypertension: Secondary | ICD-10-CM

## 2019-06-11 DIAGNOSIS — H04123 Dry eye syndrome of bilateral lacrimal glands: Secondary | ICD-10-CM | POA: Diagnosis not present

## 2019-06-11 DIAGNOSIS — H52223 Regular astigmatism, bilateral: Secondary | ICD-10-CM | POA: Diagnosis not present

## 2019-06-11 DIAGNOSIS — H338 Other retinal detachments: Secondary | ICD-10-CM | POA: Diagnosis not present

## 2019-06-11 DIAGNOSIS — H5213 Myopia, bilateral: Secondary | ICD-10-CM | POA: Diagnosis not present

## 2019-06-11 DIAGNOSIS — Z961 Presence of intraocular lens: Secondary | ICD-10-CM | POA: Diagnosis not present

## 2019-06-11 DIAGNOSIS — H524 Presbyopia: Secondary | ICD-10-CM | POA: Diagnosis not present

## 2019-06-20 DIAGNOSIS — Z23 Encounter for immunization: Secondary | ICD-10-CM | POA: Diagnosis not present

## 2019-06-25 DIAGNOSIS — Z79899 Other long term (current) drug therapy: Secondary | ICD-10-CM | POA: Diagnosis not present

## 2019-06-25 DIAGNOSIS — G3185 Corticobasal degeneration: Secondary | ICD-10-CM | POA: Diagnosis not present

## 2019-06-25 DIAGNOSIS — R2689 Other abnormalities of gait and mobility: Secondary | ICD-10-CM | POA: Diagnosis not present

## 2019-06-25 DIAGNOSIS — G4701 Insomnia due to medical condition: Secondary | ICD-10-CM | POA: Diagnosis not present

## 2019-06-25 DIAGNOSIS — R482 Apraxia: Secondary | ICD-10-CM | POA: Diagnosis not present

## 2019-07-12 ENCOUNTER — Ambulatory Visit: Payer: Medicare Other | Admitting: Family Medicine

## 2019-07-16 ENCOUNTER — Ambulatory Visit (INDEPENDENT_AMBULATORY_CARE_PROVIDER_SITE_OTHER): Payer: Medicare Other | Admitting: Family Medicine

## 2019-07-16 ENCOUNTER — Encounter: Payer: Self-pay | Admitting: Family Medicine

## 2019-07-16 ENCOUNTER — Other Ambulatory Visit: Payer: Self-pay

## 2019-07-16 VITALS — BP 120/70 | HR 68 | Ht 68.0 in | Wt 160.0 lb

## 2019-07-16 DIAGNOSIS — R531 Weakness: Secondary | ICD-10-CM

## 2019-07-16 DIAGNOSIS — G20 Parkinson's disease: Secondary | ICD-10-CM

## 2019-07-16 DIAGNOSIS — G20A1 Parkinson's disease without dyskinesia, without mention of fluctuations: Secondary | ICD-10-CM

## 2019-07-16 DIAGNOSIS — Z742 Need for assistance at home and no other household member able to render care: Secondary | ICD-10-CM

## 2019-07-16 DIAGNOSIS — R270 Ataxia, unspecified: Secondary | ICD-10-CM | POA: Diagnosis not present

## 2019-07-16 NOTE — Progress Notes (Signed)
Date:  07/16/2019   Name:  Travis Robertson   DOB:  03/26/1951   MRN:  [id number]   Chief Complaint: face to face (home health need for OTPT and pt is not driving )  Patient is a 69 year old male who presents for a face to face exam for home health/ PT/OT. The patient reports the following problems: consult for transfer and care ideas. Health maintenance has been reviewed up to date.   Lab Results  Component Value Date   CREATININE 0.97 10/17/2018   BUN 14 10/17/2018   NA 135 10/17/2018   K 4.6 10/17/2018   CL 95 (L) 10/17/2018   CO2 23 10/17/2018   Lab Results  Component Value Date   CHOL 163 10/17/2018   HDL 53 10/17/2018   LDLCALC 93 10/17/2018   TRIG 87 10/17/2018   CHOLHDL 2.8 09/01/2016   No results found for: TSH No results found for: HGBA1C Lab Results  Component Value Date   WBC 8.2 12/31/2014   HGB 13.9 12/31/2014   HCT 41.0 12/31/2014   MCV 98 (H) 12/31/2014   PLT 257 12/31/2014   Lab Results  Component Value Date   ALT 4 10/17/2018   AST 13 10/17/2018   ALKPHOS 110 10/17/2018   BILITOT 0.5 10/17/2018     Review of Systems  Constitutional: Negative for chills and fever.  HENT: Negative for drooling, ear discharge, ear pain and sore throat.   Respiratory: Negative for cough, shortness of breath and wheezing.   Cardiovascular: Negative for chest pain, palpitations and leg swelling.  Gastrointestinal: Negative for abdominal pain, blood in stool, constipation, diarrhea and nausea.  Endocrine: Negative for polydipsia.  Genitourinary: Negative for dysuria, frequency, hematuria and urgency.  Musculoskeletal: Negative for back pain, myalgias and neck pain.  Skin: Negative for rash.  Allergic/Immunologic: Negative for environmental allergies.  Neurological: Negative for dizziness and headaches.  Hematological: Does not bruise/bleed easily.  Psychiatric/Behavioral: Negative for suicidal ideas. The patient is not nervous/anxious.     Patient Active  Problem List   Diagnosis Date Noted  . Aortic atherosclerosis ([place]) 10/17/2018  . Postoperative hypothyroidism 04/24/2018  . Depression 11/11/2017  . Thyroid cancer ([place]) 08/24/2017  . Displaced fracture of acromial process 08/16/2017  . Personal history of colonic polyps   . Benign neoplasm of descending colon   . Polyp of sigmoid colon   . Rectal polyp   . Mild episode of recurrent major depressive disorder ([place]) 07/14/2017  . 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 Corticobasal syndrome ([place]) 04/21/2017  . Parkinson disease ([place]) 04/21/2017  . Loss of memory 12/23/2016  . Rapidly progressive weakness 10/28/2016  . Ataxia 10/12/2016  . B12 deficiency 10/12/2016  . Dizziness 10/12/2016  . Essential hypertension 12/31/2014  . Hyperlipidemia 12/31/2014    No Known Allergies  Past Surgical History:  Procedure Laterality Date  . APPENDECTOMY    . CATARACT EXTRACTION Bilateral   . COLONOSCOPY  01/24/2012   cleared for 5 yrs- [facility]  . COLONOSCOPY WITH PROPOFOL N/A 07/15/2017   Procedure: COLONOSCOPY WITH PROPOFOL;  Surgeon: Lucilla Lame, MD;  Location: [facility];  Service: Endoscopy;  Laterality: N/A;  . POLYPECTOMY  07/15/2017   Procedure: POLYPECTOMY INTESTINAL;  Surgeon: Lucilla Lame, MD;  Location: [facility];  Service: Endoscopy;;  Ascending colon polyp Sigmoid colon polyp x 2 Rectal polyp x 2  . RETINAL DETACHMENT SURGERY Bilateral     Social History  Tobacco Use  . Smoking status: Former Smoker    Packs/day: 1.50    Years: 40.00    Pack years: 60.00    Types: Cigarettes    Quit date: 07/2015    Years since quitting: 3.9  . Smokeless tobacco: Never Used  . Tobacco comment: smoking cessation materials not required  Substance Use Topics  . Alcohol use: Not Currently    Alcohol/week: 14.0 standard drinks    Types: 14 Cans of beer per week  . Drug use: No     Medication list has been reviewed and  updated.  Current Meds  Medication Sig  . aspirin 81 MG tablet Take 81 mg by mouth daily.  Marland Kitchen atorvastatin (LIPITOR) 10 MG tablet Take 1 tablet (10 mg total) by mouth daily.  . Calcium Carb-Cholecalciferol 600-800 MG-UNIT TABS   . carbidopa-levodopa (SINEMET) 25-100 MG tablet Take 3 tablets by mouth 3 (three) times daily.  Marland Kitchen levothyroxine (SYNTHROID, LEVOTHROID) 100 MCG tablet Take 100 mcg by mouth daily. Dr [name]  . losartan-hydrochlorothiazide (HYZAAR) 50-12.5 MG tablet Take 1 tablet by mouth daily.  . metoprolol succinate (TOPROL-XL) 100 MG 24 hr tablet TAKE 1 TABLET BY MOUTH EVERY DAY WITH OR IMMEDIATELY FOLLOWING A MEAL  . Polyethylene Glycol 3350 (PEG 3350) 17 GM/SCOOP POWD Take by mouth. PRN only  . sertraline (ZOLOFT) 50 MG tablet Take 1 tablet (50 mg total) by mouth daily.  Marland Kitchen testosterone cypionate (DEPOTESTOSTERONE CYPIONATE) 200 MG/ML injection Inject 200 mg into the muscle every 14 (fourteen) days.  . Vitamin D, Ergocalciferol, (DRISDOL) 1.25 MG (50000 UT) CAPS capsule Take 50,000 Units by mouth once a week.    PHQ 2/9 Scores 07/16/2019 04/02/2019 03/28/2019 10/17/2018  PHQ - 2 Score 0 0 0 0  PHQ- 9 Score 7 0 - 6    BP Readings from Last 3 Encounters:  07/16/19 120/70  04/02/19 100/60  10/17/18 130/80    Physical Exam Vitals and nursing note reviewed.  HENT:     Head: Normocephalic.     Right Ear: External ear normal.     Left Ear: External ear normal.     Nose: Nose normal.  Eyes:     General: No scleral icterus.       Right eye: No discharge.        Left eye: No discharge.     Conjunctiva/sclera: Conjunctivae normal.     Pupils: Pupils are equal, round, and reactive to light.  Neck:     Thyroid: No thyromegaly.     Vascular: No JVD.     Trachea: No tracheal deviation.  Cardiovascular:     Rate and Rhythm: Normal rate and regular rhythm.     Heart sounds: Normal heart sounds. No murmur. No friction rub. No gallop.   Pulmonary:     Effort: No respiratory  distress.     Breath sounds: Normal breath sounds. No wheezing, rhonchi or rales.  Abdominal:     General: Bowel sounds are normal.     Palpations: Abdomen is soft. There is no mass.     Tenderness: There is no abdominal tenderness. There is no guarding or rebound.  Musculoskeletal:        General: No tenderness. Normal range of motion.     Cervical back: Normal range of motion and neck supple.  Lymphadenopathy:     Cervical: No cervical adenopathy.  Skin:    General: Skin is warm.     Findings: No rash.  Neurological:  Mental Status: He is alert and oriented to person, place, and time.     Cranial Nerves: No cranial nerve deficit.     Deep Tendon Reflexes: Reflexes are normal and symmetric.     Wt Readings from Last 3 Encounters:  07/16/19 160 lb (72.6 kg)  04/02/19 160 lb (72.6 kg)  10/17/18 165 lb (74.8 kg)    BP 120/70   Pulse 68   Ht 5\' 8"  (1.727 m)   Wt 160 lb (72.6 kg)   BMI 24.33 kg/m   Assessment and Plan:  1. Need for home health care Face-to-face meeting today for evaluation of home health needs.  We discussed physical therapy and Occupational Therapy and the value of having home health consultation for suggested changes, instruction on transfer, and suggestions of acquisition of equipment to facilitate care.  Referral to [facility] health. - Ambulatory referral to [place]

## 2019-07-18 DIAGNOSIS — Z23 Encounter for immunization: Secondary | ICD-10-CM | POA: Diagnosis not present

## 2019-07-20 DIAGNOSIS — G20 Parkinson's disease: Secondary | ICD-10-CM | POA: Diagnosis not present

## 2019-07-24 ENCOUNTER — Emergency Department: Payer: Medicare Other

## 2019-07-24 ENCOUNTER — Encounter: Payer: Self-pay | Admitting: Medical Oncology

## 2019-07-24 ENCOUNTER — Emergency Department
Admission: EM | Admit: 2019-07-24 | Discharge: 2019-07-24 | Disposition: A | Discharge status: Home / Self Care | Payer: Medicare Other | Attending: Emergency Medicine | Admitting: Emergency Medicine

## 2019-07-24 ENCOUNTER — Other Ambulatory Visit: Payer: Self-pay

## 2019-07-24 DIAGNOSIS — S01111A Laceration without foreign body of right eyelid and periocular area, initial encounter: Secondary | ICD-10-CM | POA: Insufficient documentation

## 2019-07-24 DIAGNOSIS — E871 Hypo-osmolality and hyponatremia: Secondary | ICD-10-CM | POA: Insufficient documentation

## 2019-07-24 DIAGNOSIS — R519 Headache, unspecified: Secondary | ICD-10-CM | POA: Diagnosis not present

## 2019-07-24 DIAGNOSIS — Y939 Activity, unspecified: Secondary | ICD-10-CM | POA: Diagnosis not present

## 2019-07-24 DIAGNOSIS — D124 Benign neoplasm of descending colon: Secondary | ICD-10-CM | POA: Diagnosis not present

## 2019-07-24 DIAGNOSIS — G20 Parkinson's disease: Secondary | ICD-10-CM | POA: Insufficient documentation

## 2019-07-24 DIAGNOSIS — S01511A Laceration without foreign body of lip, initial encounter: Secondary | ICD-10-CM | POA: Insufficient documentation

## 2019-07-24 DIAGNOSIS — S0181XA Laceration without foreign body of other part of head, initial encounter: Secondary | ICD-10-CM | POA: Insufficient documentation

## 2019-07-24 DIAGNOSIS — Z85850 Personal history of malignant neoplasm of thyroid: Secondary | ICD-10-CM | POA: Insufficient documentation

## 2019-07-24 DIAGNOSIS — S0990XA Unspecified injury of head, initial encounter: Secondary | ICD-10-CM | POA: Diagnosis not present

## 2019-07-24 DIAGNOSIS — Z7982 Long term (current) use of aspirin: Secondary | ICD-10-CM | POA: Insufficient documentation

## 2019-07-24 DIAGNOSIS — W19XXXA Unspecified fall, initial encounter: Secondary | ICD-10-CM | POA: Insufficient documentation

## 2019-07-24 DIAGNOSIS — S022XXA Fracture of nasal bones, initial encounter for closed fracture: Secondary | ICD-10-CM | POA: Diagnosis not present

## 2019-07-24 DIAGNOSIS — Y929 Unspecified place or not applicable: Secondary | ICD-10-CM | POA: Insufficient documentation

## 2019-07-24 DIAGNOSIS — Y999 Unspecified external cause status: Secondary | ICD-10-CM | POA: Diagnosis not present

## 2019-07-24 DIAGNOSIS — S199XXA Unspecified injury of neck, initial encounter: Secondary | ICD-10-CM | POA: Diagnosis not present

## 2019-07-24 DIAGNOSIS — Z87891 Personal history of nicotine dependence: Secondary | ICD-10-CM | POA: Insufficient documentation

## 2019-07-24 DIAGNOSIS — D480 Neoplasm of uncertain behavior of bone and articular cartilage: Secondary | ICD-10-CM | POA: Diagnosis not present

## 2019-07-24 DIAGNOSIS — S0992XA Unspecified injury of nose, initial encounter: Secondary | ICD-10-CM | POA: Diagnosis present

## 2019-07-24 DIAGNOSIS — I10 Essential (primary) hypertension: Secondary | ICD-10-CM | POA: Diagnosis not present

## 2019-07-24 DIAGNOSIS — R58 Hemorrhage, not elsewhere classified: Secondary | ICD-10-CM | POA: Diagnosis not present

## 2019-07-24 LAB — GLUCOSE, CAPILLARY: Glucose-Capillary: 141 mg/dL — ABNORMAL HIGH (ref 70–99)

## 2019-07-24 LAB — CBC
HCT: 42.8 % (ref 39.0–52.0)
Hemoglobin: 15.4 g/dL (ref 13.0–17.0)
MCH: 34.1 pg — ABNORMAL HIGH (ref 26.0–34.0)
MCHC: 36 g/dL (ref 30.0–36.0)
MCV: 94.9 fL (ref 80.0–100.0)
Platelets: 254 10*3/uL (ref 150–400)
RBC: 4.51 MIL/uL (ref 4.22–5.81)
RDW: 12.2 % (ref 11.5–15.5)
WBC: 7.9 10*3/uL (ref 4.0–10.5)
nRBC: 0 % (ref 0.0–0.2)

## 2019-07-24 LAB — BASIC METABOLIC PANEL
Anion gap: 11 (ref 5–15)
BUN: 15 mg/dL (ref 8–23)
CO2: 27 mmol/L (ref 22–32)
Calcium: 9.3 mg/dL (ref 8.9–10.3)
Chloride: 90 mmol/L — ABNORMAL LOW (ref 98–111)
Creatinine, Ser: 1.13 mg/dL (ref 0.61–1.24)
GFR calc Af Amer: >60 mL/min (ref >60)
GFR calc non Af Amer: >60 mL/min (ref >60)
Glucose, Bld: 138 mg/dL — ABNORMAL HIGH (ref 70–99)
Potassium: 3.9 mmol/L (ref 3.5–5.1)
Sodium: 128 mmol/L — ABNORMAL LOW (ref 135–145)

## 2019-07-24 MED ORDER — LIDOCAINE HCL (PF) 1 % IJ SOLN
5.0000 mL | Freq: Once | INTRAMUSCULAR | Status: AC
  Administered 2019-07-24: 5 mL
  Filled 2019-07-24: qty 5

## 2019-07-24 MED ORDER — CEPHALEXIN 500 MG PO CAPS: 500.0000 mg | ORAL_CAPSULE | Freq: Four times a day (QID) | ORAL | 0 refills | Status: AC

## 2019-07-24 MED ORDER — MUPIROCIN 2 % EX OINT: TOPICAL_OINTMENT | CUTANEOUS | 0 refills | Status: DC

## 2019-07-24 NOTE — ED Provider Notes (Signed)
[facility] Emergency Department Provider Note       Time seen: ----------------------------------------- 1:53 PM on 07/24/2019 -----------------------------------------   I have reviewed the triage vital signs and the nursing notes.  HISTORY   Chief Complaint Fall and Facial Injury    HPI Travis Robertson is a 69 y.o. male with a history of arthritis, cortical basal syndrome, hyperlipidemia, hypertension, parkinsonian syndrome who presents to the ED for a fall.  Patient reportedly lost his balance and fell.  He had glasses on when he fell, sustained lacerations around his face and across his nose and upper lip.  Patient reports he feels like he is going to pass out.  He does not have any blood thinners other than baby aspirin.  Past Medical History:  Diagnosis Date  . Arthritis    hands  . Corticobasal syndrome ([place])   . Hyperlipidemia   . Hypertension   . Impingement syndrome of shoulder   . Parkinson disease [facility])     Patient Active Problem List   Diagnosis Date Noted  . Aortic atherosclerosis ([place]) 10/17/2018  . Postoperative hypothyroidism 04/24/2018  . Depression 11/11/2017  . Thyroid cancer ([place]) 08/24/2017  . Displaced fracture of acromial process 08/16/2017  . Personal history of colonic polyps   . Benign neoplasm of descending colon   . Polyp of sigmoid colon   . Rectal polyp   . Mild episode of recurrent major depressive disorder ([place]) 07/14/2017  . 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 Corticobasal syndrome ([place]) 04/21/2017  . Parkinson disease ([place]) 04/21/2017  . Loss of memory 12/23/2016  . Rapidly progressive weakness 10/28/2016  . Ataxia 10/12/2016  . B12 deficiency 10/12/2016  . Dizziness 10/12/2016  . Essential hypertension 12/31/2014  . Hyperlipidemia 12/31/2014    Past Surgical History:  Procedure Laterality Date  . APPENDECTOMY    . CATARACT EXTRACTION Bilateral   .  COLONOSCOPY  01/24/2012   cleared for 5 yrs- [facility]  . COLONOSCOPY WITH PROPOFOL N/A 07/15/2017   Procedure: COLONOSCOPY WITH PROPOFOL;  Surgeon: Lucilla Lame, MD;  Location: [place];  Service: Endoscopy;  Laterality: N/A;  . POLYPECTOMY  07/15/2017   Procedure: POLYPECTOMY INTESTINAL;  Surgeon: Lucilla Lame, MD;  Location: [place];  Service: Endoscopy;;  Ascending colon polyp Sigmoid colon polyp x 2 Rectal polyp x 2  . RETINAL DETACHMENT SURGERY Bilateral     Allergies Patient has no known allergies.  Social History Social History   Tobacco Use  . Smoking status: Former Smoker    Packs/day: 1.50    Years: 40.00    Pack years: 60.00    Types: Cigarettes    Quit date: 07/2015    Years since quitting: 3.9  . Smokeless tobacco: Never Used  . Tobacco comment: smoking cessation materials not required  Substance Use Topics  . Alcohol use: Not Currently    Alcohol/week: 14.0 standard drinks    Types: 14 Cans of beer per week  . Drug use: No    Review of Systems Constitutional: Negative for fever. HEENT: Positive for facial pain and lacerations Cardiovascular: Negative for chest pain. Respiratory: Negative for shortness of breath. Gastrointestinal: Negative for abdominal pain, vomiting and diarrhea. Musculoskeletal: Negative for back pain. Skin: Positive for facial lacerations Neurological: Negative for headaches, focal weakness or numbness.  All systems negative/normal/unremarkable except as stated in the HPI  ____________________________________________   PHYSICAL EXAM:  VITAL SIGNS: ED Triage Vitals  Enc Vitals  Group     BP 07/24/19 1235 (!) 150/84     Pulse Rate 07/24/19 1235 83     Resp 07/24/19 1235 20     Temp 07/24/19 1235 97.7 F (36.5 C)     Temp Source 07/24/19 1235 Oral     SpO2 07/24/19 1235 97 %     Weight 07/24/19 1236 158 lb 11.7 oz (72 kg)     Height 07/24/19 1236 5\' 8"  (1.727 m)     Head Circumference --      Peak Flow  --      Pain Score 07/24/19 1236 0     Pain Loc --      Pain Edu? --      Excl. in [place]? --     Constitutional: Alert and oriented.  No acute distress Eyes: Conjunctivae are normal. Normal extraocular movements. ENT      Head: Normocephalic, with multiple facial lacerations noted.  There is a right supraorbital curvilinear laceration that is superficial.      Nose: There is a 1 cm laceration across the bridge of the nose with mild bleeding      Mouth/Throat: Mucous membranes are moist.  Patient has a laceration on the upper lip below the vermilion border in the exact midline, 1 cm, lower lip contusion is noted      Neck: No stridor. Cardiovascular: Normal rate, regular rhythm. No murmurs, rubs, or gallops. Respiratory: Normal respiratory effort without tachypnea nor retractions. Breath sounds are clear and equal bilaterally. No wheezes/rales/rhonchi. Gastrointestinal: Soft and nontender. Normal bowel sounds Musculoskeletal: Nontender with normal range of motion in extremities. No lower extremity tenderness nor edema. Neurologic:  No gross focal neurologic deficits are appreciated.  Skin: Lacerations as noted above Psychiatric: Mood and affect are normal.  ____________________________________________  EKG: Interpreted by me.  Sinus rhythm with rate of 87 bpm, normal PR interval, normal QRS, normal QT  ____________________________________________  ED COURSE:  As part of my medical decision making, I reviewed the following data within the [place] History obtained from family if available, nursing notes, old chart and ekg, as well as notes from prior ED visits. Patient presented for fall with head injury, we will assess with labs and imaging as indicated at this time.   Marland Kitchen.Laceration Repair  Date/Time: 07/24/2019 3:05 PM Performed by: Earleen Newport, MD Authorized by: Earleen Newport, MD   Consent:    Consent obtained:  Verbal   Consent given by:   Patient   Risks discussed:  Infection, pain, retained foreign body, poor cosmetic result and poor wound healing Anesthesia (see MAR for exact dosages):    Anesthesia method:  Local infiltration   Local anesthetic:  Lidocaine 1% w/o epi Laceration details:    Location:  Lip   Lip location:  Upper lip, full thickness   Vermilion border involved: yes     Height of lip laceration:  More than half vertical height   Length (cm):  2.5   Depth (mm):  10 Repair type:    Repair type:  Complex Pre-procedure details:    Preparation:  Patient was prepped and draped in usual sterile fashion Exploration:    Limited defect created (wound extended): no     Hemostasis achieved with:  Direct pressure   Wound exploration: entire depth of wound probed and visualized     Wound extent: muscle damage     Contaminated: no   Treatment:    Area cleansed with:  Saline  Amount of cleaning:  Extensive   Irrigation solution:  Sterile saline   Visualized foreign bodies/material removed: no     Debridement:  None Subcutaneous repair:    Suture size:  6-0   Suture material:  Vicryl   Number of sutures:  2 Skin repair:    Repair method:  Sutures   Suture size:  6-0   Suture technique:  Running   Number of sutures:  10 Approximation:    Approximation:  Close   Vermilion border: well-aligned   Post-procedure details:    Dressing:  Sterile dressing and antibiotic ointment   Patient tolerance of procedure:  Tolerated well, no immediate complications    Travis Robertson was evaluated in Emergency Department on 07/24/2019 for the symptoms described in the history of present illness. He was evaluated in the context of the global COVID-19 pandemic, which necessitated consideration that the patient might be at risk for infection with the SARS-CoV-2 virus that causes COVID-19. Institutional protocols and algorithms that pertain to the evaluation of patients at risk for COVID-19 are in a state of rapid change  based on information released by regulatory bodies including the CDC and federal and state organizations. These policies and algorithms were followed during the patient's care in the ED.  ____________________________________________   LABS (pertinent positives/negatives)  Labs Reviewed  BASIC METABOLIC PANEL - Abnormal; Notable for the following components:      Result Value   Sodium 128 (*)    Chloride 90 (*)    Glucose, Bld 138 (*)    All other components within normal limits  CBC - Abnormal; Notable for the following components:   MCH 34.1 (*)    All other components within normal limits  GLUCOSE, CAPILLARY - Abnormal; Notable for the following components:   Glucose-Capillary 141 (*)    All other components within normal limits  URINALYSIS, COMPLETE (UACMP) WITH MICROSCOPIC  CBG MONITORING, ED    RADIOLOGY Images were viewed by me  CT head, C-spine, maxillofacial IMPRESSION: CT head:  1. No evidence of acute intracranial abnormality. 2. Moderate generalized parenchymal atrophy.  CT maxillofacial:  Comminuted and displaced right nasal bone fracture. Minimally displaced fracture of the left nasal bone. Overlying soft tissue swelling/hematoma.  CT cervical spine:  1. No evidence of acute fracture to the cervical spine. 2. Cervical spondylosis greatest at C5-C6 and C6-C7. 3. Multilevel spondylolisthesis, which is which is nonspecific but may be degenerative.  ____________________________________________   DIFFERENTIAL DIAGNOSIS   Fall, contusion, fracture, subdural, dehydration, electrolyte abnormality  FINAL ASSESSMENT AND PLAN  Fall, facial injury, nasal fracture, laceration, hyponatremia   Plan: The patient had presented for a fall. Patient's labs did reveal some hyponatremia which is likely from his antihypertensive agent.  He had a complicated upper lip laceration which was repaired as dictated above.  We will advise stopping this for several days.  Patient's imaging was negative with the exception of nasal bone fractures.  He will be referred to ENT for close outpatient follow-up.   Laurence Aly, MD    Note: This note was generated in part or whole with voice recognition software. Voice recognition is usually quite accurate but there are transcription errors that can and very often do occur. I apologize for any typographical errors that were not detected and corrected.     Earleen Newport, MD 07/24/19 [phone]

## 2019-07-24 NOTE — ED Notes (Signed)
Pt alert and oriented X 4, stable for discharge. RR even and unlabored, color WNL. Discussed discharge instructions and follow up when appropriate. Instructed to follow up with ER for any life threatening symptoms or concerns that patient or family of patient may have  

## 2019-07-24 NOTE — ED Triage Notes (Signed)
Pt from home via ems- reports that pt has parkingsons and fell face first when he lost his balance. Lac over rt eye, lip and nose. Denies LOC. Pt currently reports he feels like hes going to pass out. No use of blood thinners.

## 2019-07-30 ENCOUNTER — Ambulatory Visit: Payer: Self-pay | Admitting: Family Medicine

## 2019-07-30 DIAGNOSIS — E538 Deficiency of other specified B group vitamins: Secondary | ICD-10-CM | POA: Diagnosis not present

## 2019-07-30 DIAGNOSIS — E890 Postprocedural hypothyroidism: Secondary | ICD-10-CM | POA: Diagnosis not present

## 2019-07-30 DIAGNOSIS — F0280 Dementia in other diseases classified elsewhere without behavioral disturbance: Secondary | ICD-10-CM | POA: Diagnosis not present

## 2019-07-30 DIAGNOSIS — G20 Parkinson's disease: Secondary | ICD-10-CM | POA: Diagnosis not present

## 2019-07-30 DIAGNOSIS — F329 Major depressive disorder, single episode, unspecified: Secondary | ICD-10-CM | POA: Diagnosis not present

## 2019-07-30 DIAGNOSIS — E785 Hyperlipidemia, unspecified: Secondary | ICD-10-CM | POA: Diagnosis not present

## 2019-07-30 DIAGNOSIS — Z9181 History of falling: Secondary | ICD-10-CM | POA: Diagnosis not present

## 2019-07-30 DIAGNOSIS — I700 Atherosclerosis of aorta: Secondary | ICD-10-CM | POA: Diagnosis not present

## 2019-07-30 DIAGNOSIS — Z87891 Personal history of nicotine dependence: Secondary | ICD-10-CM | POA: Diagnosis not present

## 2019-07-30 DIAGNOSIS — Z85850 Personal history of malignant neoplasm of thyroid: Secondary | ICD-10-CM | POA: Diagnosis not present

## 2019-07-30 DIAGNOSIS — G3185 Corticobasal degeneration: Secondary | ICD-10-CM | POA: Diagnosis not present

## 2019-07-30 DIAGNOSIS — I10 Essential (primary) hypertension: Secondary | ICD-10-CM | POA: Diagnosis not present

## 2019-07-30 DIAGNOSIS — S022XXD Fracture of nasal bones, subsequent encounter for fracture with routine healing: Secondary | ICD-10-CM | POA: Diagnosis not present

## 2019-07-31 ENCOUNTER — Telehealth: Payer: Self-pay

## 2019-07-31 DIAGNOSIS — G20 Parkinson's disease: Secondary | ICD-10-CM | POA: Diagnosis not present

## 2019-07-31 DIAGNOSIS — F329 Major depressive disorder, single episode, unspecified: Secondary | ICD-10-CM | POA: Diagnosis not present

## 2019-07-31 DIAGNOSIS — G3185 Corticobasal degeneration: Secondary | ICD-10-CM | POA: Diagnosis not present

## 2019-07-31 DIAGNOSIS — S022XXD Fracture of nasal bones, subsequent encounter for fracture with routine healing: Secondary | ICD-10-CM | POA: Diagnosis not present

## 2019-07-31 DIAGNOSIS — I10 Essential (primary) hypertension: Secondary | ICD-10-CM | POA: Diagnosis not present

## 2019-07-31 DIAGNOSIS — F0280 Dementia in other diseases classified elsewhere without behavioral disturbance: Secondary | ICD-10-CM | POA: Diagnosis not present

## 2019-07-31 NOTE — Telephone Encounter (Signed)
I received a call from [place] today stating that pt had gotten much worse. She has got an appt this Thursday with neurology and OT is coming today. He cannot move/ transfer without total assistance from family. I advised her to get back in touch with [facility] and see if the nurse can come back out and re-evaluate the situation.

## 2019-08-02 DIAGNOSIS — G3185 Corticobasal degeneration: Secondary | ICD-10-CM | POA: Diagnosis not present

## 2019-08-02 DIAGNOSIS — R2689 Other abnormalities of gait and mobility: Secondary | ICD-10-CM | POA: Diagnosis not present

## 2019-08-06 DIAGNOSIS — G3185 Corticobasal degeneration: Secondary | ICD-10-CM | POA: Diagnosis not present

## 2019-08-06 DIAGNOSIS — F329 Major depressive disorder, single episode, unspecified: Secondary | ICD-10-CM | POA: Diagnosis not present

## 2019-08-06 DIAGNOSIS — S022XXD Fracture of nasal bones, subsequent encounter for fracture with routine healing: Secondary | ICD-10-CM | POA: Diagnosis not present

## 2019-08-06 DIAGNOSIS — F0280 Dementia in other diseases classified elsewhere without behavioral disturbance: Secondary | ICD-10-CM | POA: Diagnosis not present

## 2019-08-06 DIAGNOSIS — I10 Essential (primary) hypertension: Secondary | ICD-10-CM | POA: Diagnosis not present

## 2019-08-06 DIAGNOSIS — G20 Parkinson's disease: Secondary | ICD-10-CM | POA: Diagnosis not present

## 2019-08-08 DIAGNOSIS — G20 Parkinson's disease: Secondary | ICD-10-CM | POA: Diagnosis not present

## 2019-08-08 DIAGNOSIS — F329 Major depressive disorder, single episode, unspecified: Secondary | ICD-10-CM | POA: Diagnosis not present

## 2019-08-08 DIAGNOSIS — F0280 Dementia in other diseases classified elsewhere without behavioral disturbance: Secondary | ICD-10-CM | POA: Diagnosis not present

## 2019-08-08 DIAGNOSIS — G3185 Corticobasal degeneration: Secondary | ICD-10-CM | POA: Diagnosis not present

## 2019-08-08 DIAGNOSIS — S022XXD Fracture of nasal bones, subsequent encounter for fracture with routine healing: Secondary | ICD-10-CM | POA: Diagnosis not present

## 2019-08-08 DIAGNOSIS — I10 Essential (primary) hypertension: Secondary | ICD-10-CM | POA: Diagnosis not present

## 2019-08-14 DIAGNOSIS — G20 Parkinson's disease: Secondary | ICD-10-CM | POA: Diagnosis not present

## 2019-08-14 DIAGNOSIS — F0280 Dementia in other diseases classified elsewhere without behavioral disturbance: Secondary | ICD-10-CM | POA: Diagnosis not present

## 2019-08-14 DIAGNOSIS — I10 Essential (primary) hypertension: Secondary | ICD-10-CM | POA: Diagnosis not present

## 2019-08-14 DIAGNOSIS — S022XXD Fracture of nasal bones, subsequent encounter for fracture with routine healing: Secondary | ICD-10-CM | POA: Diagnosis not present

## 2019-08-14 DIAGNOSIS — F329 Major depressive disorder, single episode, unspecified: Secondary | ICD-10-CM | POA: Diagnosis not present

## 2019-08-14 DIAGNOSIS — G3185 Corticobasal degeneration: Secondary | ICD-10-CM | POA: Diagnosis not present

## 2019-08-15 DIAGNOSIS — F329 Major depressive disorder, single episode, unspecified: Secondary | ICD-10-CM | POA: Diagnosis not present

## 2019-08-15 DIAGNOSIS — I10 Essential (primary) hypertension: Secondary | ICD-10-CM | POA: Diagnosis not present

## 2019-08-15 DIAGNOSIS — S022XXD Fracture of nasal bones, subsequent encounter for fracture with routine healing: Secondary | ICD-10-CM | POA: Diagnosis not present

## 2019-08-15 DIAGNOSIS — G20 Parkinson's disease: Secondary | ICD-10-CM | POA: Diagnosis not present

## 2019-08-15 DIAGNOSIS — F0280 Dementia in other diseases classified elsewhere without behavioral disturbance: Secondary | ICD-10-CM | POA: Diagnosis not present

## 2019-08-15 DIAGNOSIS — G3185 Corticobasal degeneration: Secondary | ICD-10-CM | POA: Diagnosis not present

## 2019-08-16 ENCOUNTER — Telehealth: Payer: Self-pay | Admitting: Family Medicine

## 2019-08-16 DIAGNOSIS — F329 Major depressive disorder, single episode, unspecified: Secondary | ICD-10-CM | POA: Diagnosis not present

## 2019-08-16 DIAGNOSIS — F0280 Dementia in other diseases classified elsewhere without behavioral disturbance: Secondary | ICD-10-CM | POA: Diagnosis not present

## 2019-08-16 DIAGNOSIS — I10 Essential (primary) hypertension: Secondary | ICD-10-CM | POA: Diagnosis not present

## 2019-08-16 DIAGNOSIS — G3185 Corticobasal degeneration: Secondary | ICD-10-CM | POA: Diagnosis not present

## 2019-08-16 DIAGNOSIS — G20 Parkinson's disease: Secondary | ICD-10-CM | POA: Diagnosis not present

## 2019-08-16 DIAGNOSIS — S022XXD Fracture of nasal bones, subsequent encounter for fracture with routine healing: Secondary | ICD-10-CM | POA: Diagnosis not present

## 2019-08-16 NOTE — Telephone Encounter (Unsigned)
Copied from [place] [id number]. Topic: General - Inquiry >> Aug 16, 2019  1:42 PM Mathis Bud wrote: Reason for CRM: Patient wife called requesting to speak with Baxter Flattery. Call back [phone]

## 2019-08-16 NOTE — Telephone Encounter (Signed)
Called and spoke to [facility] concerning a pressure sore on pt's buttocks. She is going to call nursing with [facility] to come out and evaluate. She asked for a number to podiatry for "his feet do not look good." I gave her Dr Alvera Singh number and told her to call us if needing referral. She also is going to be renting a hospital bed for pt.

## 2019-08-19 DIAGNOSIS — G20 Parkinson's disease: Secondary | ICD-10-CM | POA: Diagnosis not present

## 2019-08-19 DIAGNOSIS — F0280 Dementia in other diseases classified elsewhere without behavioral disturbance: Secondary | ICD-10-CM | POA: Diagnosis not present

## 2019-08-19 DIAGNOSIS — I10 Essential (primary) hypertension: Secondary | ICD-10-CM | POA: Diagnosis not present

## 2019-08-19 DIAGNOSIS — G3185 Corticobasal degeneration: Secondary | ICD-10-CM | POA: Diagnosis not present

## 2019-08-19 DIAGNOSIS — F329 Major depressive disorder, single episode, unspecified: Secondary | ICD-10-CM | POA: Diagnosis not present

## 2019-08-19 DIAGNOSIS — S022XXD Fracture of nasal bones, subsequent encounter for fracture with routine healing: Secondary | ICD-10-CM | POA: Diagnosis not present

## 2019-08-20 DIAGNOSIS — F329 Major depressive disorder, single episode, unspecified: Secondary | ICD-10-CM | POA: Diagnosis not present

## 2019-08-20 DIAGNOSIS — S022XXD Fracture of nasal bones, subsequent encounter for fracture with routine healing: Secondary | ICD-10-CM | POA: Diagnosis not present

## 2019-08-20 DIAGNOSIS — G3185 Corticobasal degeneration: Secondary | ICD-10-CM | POA: Diagnosis not present

## 2019-08-20 DIAGNOSIS — I10 Essential (primary) hypertension: Secondary | ICD-10-CM | POA: Diagnosis not present

## 2019-08-20 DIAGNOSIS — F0280 Dementia in other diseases classified elsewhere without behavioral disturbance: Secondary | ICD-10-CM | POA: Diagnosis not present

## 2019-08-20 DIAGNOSIS — G20 Parkinson's disease: Secondary | ICD-10-CM | POA: Diagnosis not present

## 2019-08-21 ENCOUNTER — Telehealth: Payer: Self-pay

## 2019-08-21 NOTE — Telephone Encounter (Signed)
Pt's wife called yesterday stating pt is weak, confused and cannot help her ambulate. I explained the pt needed to go to hospital to be seen. He could possibly be septic

## 2019-08-22 DIAGNOSIS — I10 Essential (primary) hypertension: Secondary | ICD-10-CM | POA: Diagnosis not present

## 2019-08-22 DIAGNOSIS — G20 Parkinson's disease: Secondary | ICD-10-CM | POA: Diagnosis not present

## 2019-08-22 DIAGNOSIS — F329 Major depressive disorder, single episode, unspecified: Secondary | ICD-10-CM | POA: Diagnosis not present

## 2019-08-22 DIAGNOSIS — G3185 Corticobasal degeneration: Secondary | ICD-10-CM | POA: Diagnosis not present

## 2019-08-22 DIAGNOSIS — F0280 Dementia in other diseases classified elsewhere without behavioral disturbance: Secondary | ICD-10-CM | POA: Diagnosis not present

## 2019-08-22 DIAGNOSIS — S022XXD Fracture of nasal bones, subsequent encounter for fracture with routine healing: Secondary | ICD-10-CM | POA: Diagnosis not present

## 2019-08-23 DIAGNOSIS — F329 Major depressive disorder, single episode, unspecified: Secondary | ICD-10-CM | POA: Diagnosis not present

## 2019-08-23 DIAGNOSIS — F0280 Dementia in other diseases classified elsewhere without behavioral disturbance: Secondary | ICD-10-CM | POA: Diagnosis not present

## 2019-08-23 DIAGNOSIS — G20 Parkinson's disease: Secondary | ICD-10-CM | POA: Diagnosis not present

## 2019-08-23 DIAGNOSIS — S022XXD Fracture of nasal bones, subsequent encounter for fracture with routine healing: Secondary | ICD-10-CM | POA: Diagnosis not present

## 2019-08-23 DIAGNOSIS — G3185 Corticobasal degeneration: Secondary | ICD-10-CM | POA: Diagnosis not present

## 2019-08-23 DIAGNOSIS — I10 Essential (primary) hypertension: Secondary | ICD-10-CM | POA: Diagnosis not present

## 2019-08-24 DIAGNOSIS — F0280 Dementia in other diseases classified elsewhere without behavioral disturbance: Secondary | ICD-10-CM | POA: Diagnosis not present

## 2019-08-24 DIAGNOSIS — F329 Major depressive disorder, single episode, unspecified: Secondary | ICD-10-CM | POA: Diagnosis not present

## 2019-08-24 DIAGNOSIS — S022XXD Fracture of nasal bones, subsequent encounter for fracture with routine healing: Secondary | ICD-10-CM | POA: Diagnosis not present

## 2019-08-24 DIAGNOSIS — I10 Essential (primary) hypertension: Secondary | ICD-10-CM | POA: Diagnosis not present

## 2019-08-24 DIAGNOSIS — G3185 Corticobasal degeneration: Secondary | ICD-10-CM | POA: Diagnosis not present

## 2019-08-24 DIAGNOSIS — G20 Parkinson's disease: Secondary | ICD-10-CM | POA: Diagnosis not present

## 2019-08-25 ENCOUNTER — Other Ambulatory Visit: Payer: Self-pay | Admitting: Family Medicine

## 2019-08-25 DIAGNOSIS — I10 Essential (primary) hypertension: Secondary | ICD-10-CM

## 2019-08-25 NOTE — Telephone Encounter (Signed)
Requested Prescriptions  Pending Prescriptions Disp Refills  . metoprolol succinate (TOPROL-XL) 100 MG 24 hr tablet [Pharmacy Med Name: METOPROLOL SUCC ER 100 MG TAB] 90 tablet 1    Sig: TAKE 1 TABLET BY MOUTH EVERY DAY WITH OR IMMEDIATELY FOLLOWING A MEAL     Cardiovascular:  Beta Blockers Failed - 08/25/2019 11:01 AM      Failed - Last BP in normal range    BP Readings from Last 1 Encounters:  07/24/19 (!) 150/97         Passed - Last Heart Rate in normal range    Pulse Readings from Last 1 Encounters:  07/24/19 91         Passed - Valid encounter within last 6 months    Recent Outpatient Visits          1 month ago Need for home health care   [facility] Juline Patch, MD   4 months ago Hyperlipidemia, unspecified hyperlipidemia type   [facility] Juline Patch, MD   10 months ago Essential hypertension   [place], Deanna C, MD   1 year ago Essential hypertension   [facility] Juline Patch, MD   1 year ago Contusion of left back wall of thorax, initial encounter   [place], MD      Future Appointments            In 1 month Juline Patch, MD [facility], [facility]

## 2019-08-26 ENCOUNTER — Other Ambulatory Visit: Payer: Self-pay | Admitting: Family Medicine

## 2019-08-26 DIAGNOSIS — E785 Hyperlipidemia, unspecified: Secondary | ICD-10-CM

## 2019-08-26 NOTE — Telephone Encounter (Signed)
Requested Prescriptions  Pending Prescriptions Disp Refills  . atorvastatin (LIPITOR) 10 MG tablet [Pharmacy Med Name: ATORVASTATIN 10 MG TABLET] 90 tablet 0    Sig: TAKE 1 TABLET BY MOUTH EVERY DAY     Cardiovascular:  Antilipid - Statins Passed - 08/26/2019  9:56 AM      Passed - Total Cholesterol in normal range and within 360 days    Cholesterol, Total  Date Value Ref Range Status  10/17/2018 163 100 - 199 mg/dL Final         Passed - LDL in normal range and within 360 days    LDL Calculated  Date Value Ref Range Status  10/17/2018 93 0 - 99 mg/dL Final         Passed - HDL in normal range and within 360 days    HDL  Date Value Ref Range Status  10/17/2018 53 >39 mg/dL Final         Passed - Triglycerides in normal range and within 360 days    Triglycerides  Date Value Ref Range Status  10/17/2018 87 0 - 149 mg/dL Final         Passed - Patient is not pregnant      Passed - Valid encounter within last 12 months    Recent Outpatient Visits          1 month ago Need for home health care   [facility] Juline Patch, MD   4 months ago Hyperlipidemia, unspecified hyperlipidemia type   [facility] Juline Patch, MD   10 months ago Essential hypertension   [place], Deanna C, MD   1 year ago Essential hypertension   [facility] Juline Patch, MD   1 year ago Contusion of left back wall of thorax, initial encounter   [place], [place], MD      Future Appointments            In 1 month Juline Patch, MD [facility]            Pt due for labs in 1 month.

## 2019-08-27 DIAGNOSIS — F0280 Dementia in other diseases classified elsewhere without behavioral disturbance: Secondary | ICD-10-CM | POA: Diagnosis not present

## 2019-08-27 DIAGNOSIS — S022XXD Fracture of nasal bones, subsequent encounter for fracture with routine healing: Secondary | ICD-10-CM | POA: Diagnosis not present

## 2019-08-27 DIAGNOSIS — G3185 Corticobasal degeneration: Secondary | ICD-10-CM | POA: Diagnosis not present

## 2019-08-27 DIAGNOSIS — G20 Parkinson's disease: Secondary | ICD-10-CM | POA: Diagnosis not present

## 2019-08-27 DIAGNOSIS — F329 Major depressive disorder, single episode, unspecified: Secondary | ICD-10-CM | POA: Diagnosis not present

## 2019-08-27 DIAGNOSIS — I10 Essential (primary) hypertension: Secondary | ICD-10-CM | POA: Diagnosis not present

## 2019-08-28 DIAGNOSIS — G3185 Corticobasal degeneration: Secondary | ICD-10-CM | POA: Diagnosis not present

## 2019-08-28 DIAGNOSIS — S022XXD Fracture of nasal bones, subsequent encounter for fracture with routine healing: Secondary | ICD-10-CM | POA: Diagnosis not present

## 2019-08-28 DIAGNOSIS — F0280 Dementia in other diseases classified elsewhere without behavioral disturbance: Secondary | ICD-10-CM | POA: Diagnosis not present

## 2019-08-28 DIAGNOSIS — F329 Major depressive disorder, single episode, unspecified: Secondary | ICD-10-CM | POA: Diagnosis not present

## 2019-08-28 DIAGNOSIS — G20 Parkinson's disease: Secondary | ICD-10-CM | POA: Diagnosis not present

## 2019-08-28 DIAGNOSIS — I10 Essential (primary) hypertension: Secondary | ICD-10-CM | POA: Diagnosis not present

## 2019-08-29 DIAGNOSIS — E890 Postprocedural hypothyroidism: Secondary | ICD-10-CM | POA: Diagnosis not present

## 2019-08-29 DIAGNOSIS — E785 Hyperlipidemia, unspecified: Secondary | ICD-10-CM | POA: Diagnosis not present

## 2019-08-29 DIAGNOSIS — Z85850 Personal history of malignant neoplasm of thyroid: Secondary | ICD-10-CM | POA: Diagnosis not present

## 2019-08-29 DIAGNOSIS — S022XXD Fracture of nasal bones, subsequent encounter for fracture with routine healing: Secondary | ICD-10-CM | POA: Diagnosis not present

## 2019-08-29 DIAGNOSIS — G20 Parkinson's disease: Secondary | ICD-10-CM | POA: Diagnosis not present

## 2019-08-29 DIAGNOSIS — F0280 Dementia in other diseases classified elsewhere without behavioral disturbance: Secondary | ICD-10-CM | POA: Diagnosis not present

## 2019-08-29 DIAGNOSIS — Z87891 Personal history of nicotine dependence: Secondary | ICD-10-CM | POA: Diagnosis not present

## 2019-08-29 DIAGNOSIS — F329 Major depressive disorder, single episode, unspecified: Secondary | ICD-10-CM | POA: Diagnosis not present

## 2019-08-29 DIAGNOSIS — I10 Essential (primary) hypertension: Secondary | ICD-10-CM | POA: Diagnosis not present

## 2019-08-29 DIAGNOSIS — G3185 Corticobasal degeneration: Secondary | ICD-10-CM | POA: Diagnosis not present

## 2019-08-29 DIAGNOSIS — I700 Atherosclerosis of aorta: Secondary | ICD-10-CM | POA: Diagnosis not present

## 2019-08-29 DIAGNOSIS — E538 Deficiency of other specified B group vitamins: Secondary | ICD-10-CM | POA: Diagnosis not present

## 2019-08-29 DIAGNOSIS — Z9181 History of falling: Secondary | ICD-10-CM | POA: Diagnosis not present

## 2019-08-30 DIAGNOSIS — I10 Essential (primary) hypertension: Secondary | ICD-10-CM | POA: Diagnosis not present

## 2019-08-30 DIAGNOSIS — F0280 Dementia in other diseases classified elsewhere without behavioral disturbance: Secondary | ICD-10-CM | POA: Diagnosis not present

## 2019-08-30 DIAGNOSIS — S022XXD Fracture of nasal bones, subsequent encounter for fracture with routine healing: Secondary | ICD-10-CM | POA: Diagnosis not present

## 2019-08-30 DIAGNOSIS — F329 Major depressive disorder, single episode, unspecified: Secondary | ICD-10-CM | POA: Diagnosis not present

## 2019-08-30 DIAGNOSIS — G20 Parkinson's disease: Secondary | ICD-10-CM | POA: Diagnosis not present

## 2019-08-30 DIAGNOSIS — G3185 Corticobasal degeneration: Secondary | ICD-10-CM | POA: Diagnosis not present

## 2019-08-31 ENCOUNTER — Encounter: Payer: Self-pay | Admitting: Family Medicine

## 2019-08-31 ENCOUNTER — Ambulatory Visit (INDEPENDENT_AMBULATORY_CARE_PROVIDER_SITE_OTHER): Payer: Medicare Other | Admitting: Family Medicine

## 2019-08-31 DIAGNOSIS — F329 Major depressive disorder, single episode, unspecified: Secondary | ICD-10-CM | POA: Diagnosis not present

## 2019-08-31 DIAGNOSIS — N411 Chronic prostatitis: Secondary | ICD-10-CM

## 2019-08-31 DIAGNOSIS — S022XXD Fracture of nasal bones, subsequent encounter for fracture with routine healing: Secondary | ICD-10-CM | POA: Diagnosis not present

## 2019-08-31 DIAGNOSIS — G20 Parkinson's disease: Secondary | ICD-10-CM | POA: Diagnosis not present

## 2019-08-31 DIAGNOSIS — F0280 Dementia in other diseases classified elsewhere without behavioral disturbance: Secondary | ICD-10-CM | POA: Diagnosis not present

## 2019-08-31 DIAGNOSIS — G3185 Corticobasal degeneration: Secondary | ICD-10-CM | POA: Diagnosis not present

## 2019-08-31 DIAGNOSIS — I10 Essential (primary) hypertension: Secondary | ICD-10-CM | POA: Diagnosis not present

## 2019-08-31 MED ORDER — CEPHALEXIN 500 MG PO CAPS: 500.0000 mg | ORAL_CAPSULE | Freq: Three times a day (TID) | ORAL | 0 refills | Status: DC

## 2019-08-31 NOTE — Progress Notes (Signed)
Date:  08/31/2019   Name:  Travis Robertson   DOB:  November 06, 1950   MRN:  [id number]   Chief Complaint: Urinary Tract Infection (x 1 week, no discoloration in urine)  I connected withthis patient, Travis Robertson, by telephoneat the patient's home.  I verified that I am speaking with the correct person using two identifiers. This visit was conducted via telephone due to the Covid-19 outbreak from my office at [facility] in [place], [place]. I discussed the limitations, risks, security and privacy concerns of performing an evaluation and management service by telephone. I also discussed with the patient that there may be a patient responsible charge related to this service. The patient expressed understanding and agreed to proceed.  Urinary Tract Infection  This is a new problem. The current episode started in the past 7 days. There has been no fever. Associated symptoms include frequency, hesitancy and urgency. Pertinent negatives include no chills, flank pain, hematuria or sweats. He has tried nothing for the symptoms.    Lab Results  Component Value Date   CREATININE 1.13 07/24/2019   BUN 15 07/24/2019   NA 128 (L) 07/24/2019   K 3.9 07/24/2019   CL 90 (L) 07/24/2019   CO2 27 07/24/2019   Lab Results  Component Value Date   CHOL 163 10/17/2018   HDL 53 10/17/2018   LDLCALC 93 10/17/2018   TRIG 87 10/17/2018   CHOLHDL 2.8 09/01/2016   No results found for: TSH No results found for: HGBA1C Lab Results  Component Value Date   WBC 7.9 07/24/2019   HGB 15.4 07/24/2019   HCT 42.8 07/24/2019   MCV 94.9 07/24/2019   PLT 254 07/24/2019   Lab Results  Component Value Date   ALT 4 10/17/2018   AST 13 10/17/2018   ALKPHOS 110 10/17/2018   BILITOT 0.5 10/17/2018     Review of Systems  Constitutional: Negative for chills.  Genitourinary: Positive for frequency, hesitancy and urgency. Negative for flank pain and hematuria.    Patient Active Problem List   Diagnosis  Date Noted  . Aortic atherosclerosis ([place]) 10/17/2018  . Postoperative hypothyroidism 04/24/2018  . Depression 11/11/2017  . Thyroid cancer ([place]) 08/24/2017  . Displaced fracture of acromial process 08/16/2017  . Personal history of colonic polyps   . Benign neoplasm of descending colon   . Polyp of sigmoid colon   . Rectal polyp   . Mild episode of recurrent major depressive disorder ([place]) 07/14/2017  . 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 Corticobasal syndrome ([place]) 04/21/2017  . Parkinson disease ([place]) 04/21/2017  . Loss of memory 12/23/2016  . Rapidly progressive weakness 10/28/2016  . Ataxia 10/12/2016  . B12 deficiency 10/12/2016  . Dizziness 10/12/2016  . Essential hypertension 12/31/2014  . Hyperlipidemia 12/31/2014    No Known Allergies  Past Surgical History:  Procedure Laterality Date  . APPENDECTOMY    . CATARACT EXTRACTION Bilateral   . COLONOSCOPY  01/24/2012   cleared for 5 yrs- [facility]  . COLONOSCOPY WITH PROPOFOL N/A 07/15/2017   Procedure: COLONOSCOPY WITH PROPOFOL;  Surgeon: Lucilla Lame, MD;  Location: [place];  Service: Endoscopy;  Laterality: N/A;  . POLYPECTOMY  07/15/2017   Procedure: POLYPECTOMY INTESTINAL;  Surgeon: Lucilla Lame, MD;  Location: [place];  Service: Endoscopy;;  Ascending colon polyp Sigmoid colon polyp x 2 Rectal polyp x 2  . RETINAL DETACHMENT SURGERY Bilateral     Social  History   Tobacco Use  . Smoking status: Former Smoker    Packs/day: 1.50    Years: 40.00    Pack years: 60.00    Types: Cigarettes    Quit date: 07/2015    Years since quitting: 4.1  . Smokeless tobacco: Never Used  . Tobacco comment: smoking cessation materials not required  Substance Use Topics  . Alcohol use: Not Currently    Alcohol/week: 14.0 standard drinks    Types: 14 Cans of beer per week  . Drug use: No     Medication list has been reviewed and updated.  Current Meds    Medication Sig  . aspirin 81 MG tablet Take 81 mg by mouth daily.  Marland Kitchen atorvastatin (LIPITOR) 10 MG tablet TAKE 1 TABLET BY MOUTH EVERY DAY  . Calcium Carb-Cholecalciferol 600-800 MG-UNIT TABS   . carbidopa-levodopa (SINEMET) 25-100 MG tablet Take 3 tablets by mouth 3 (three) times daily.  Marland Kitchen levothyroxine (SYNTHROID, LEVOTHROID) 100 MCG tablet Take 100 mcg by mouth daily. Dr [name]  . losartan-hydrochlorothiazide (HYZAAR) 50-12.5 MG tablet Take 1 tablet by mouth daily.  . metoprolol succinate (TOPROL-XL) 100 MG 24 hr tablet TAKE 1 TABLET BY MOUTH EVERY DAY WITH OR IMMEDIATELY FOLLOWING A MEAL  . mupirocin ointment (BACTROBAN) 2 % Apply to affected area 3 times daily  . Polyethylene Glycol 3350 (PEG 3350) 17 GM/SCOOP POWD Take by mouth. PRN only  . sertraline (ZOLOFT) 50 MG tablet Take 1 tablet (50 mg total) by mouth daily.  Marland Kitchen testosterone cypionate (DEPOTESTOSTERONE CYPIONATE) 200 MG/ML injection Inject 200 mg into the muscle every 14 (fourteen) days.  . Vitamin D, Ergocalciferol, (DRISDOL) 1.25 MG (50000 UT) CAPS capsule Take 50,000 Units by mouth once a week.    PHQ 2/9 Scores 08/31/2019 07/16/2019 04/02/2019 03/28/2019  PHQ - 2 Score 0 0 0 0  PHQ- 9 Score 4 7 0 -    BP Readings from Last 3 Encounters:  07/24/19 (!) 150/97  07/16/19 120/70  04/02/19 100/60    Physical Exam  Wt Readings from Last 3 Encounters:  07/24/19 158 lb 11.7 oz (72 kg)  07/16/19 160 lb (72.6 kg)  04/02/19 160 lb (72.6 kg)    There were no vitals taken for this visit.   Assessment and Plan: 1. Subacute prostatitis Patient with telemedical visit for symptoms of frequency urgency and hesitancy.  Patient relates no fever.  Symptoms are suggestive of a subacute prostatitis for which we will initiate cephalexin 500 mg 3 times a day for 10 days.  Family has been advised that if patient starts having difficulty with increased confusion, increased physical concerns such as muscular weakness, and/or fever and chills  that this may be a sepsis circumstance and needs to be taken to the hospital. - cephALEXin (KEFLEX) 500 MG capsule; Take 1 capsule (500 mg total) by mouth 3 (three) times daily.  Dispense: 30 capsule; Refill: 0  I spent 12 minutes with this patient, More than 50% of that time was spent in face to face education, counseling and care coordination.

## 2019-09-04 DIAGNOSIS — F329 Major depressive disorder, single episode, unspecified: Secondary | ICD-10-CM | POA: Diagnosis not present

## 2019-09-04 DIAGNOSIS — G20 Parkinson's disease: Secondary | ICD-10-CM | POA: Diagnosis not present

## 2019-09-04 DIAGNOSIS — I10 Essential (primary) hypertension: Secondary | ICD-10-CM | POA: Diagnosis not present

## 2019-09-04 DIAGNOSIS — S022XXD Fracture of nasal bones, subsequent encounter for fracture with routine healing: Secondary | ICD-10-CM | POA: Diagnosis not present

## 2019-09-04 DIAGNOSIS — G3185 Corticobasal degeneration: Secondary | ICD-10-CM | POA: Diagnosis not present

## 2019-09-04 DIAGNOSIS — F0280 Dementia in other diseases classified elsewhere without behavioral disturbance: Secondary | ICD-10-CM | POA: Diagnosis not present

## 2019-09-06 DIAGNOSIS — F0280 Dementia in other diseases classified elsewhere without behavioral disturbance: Secondary | ICD-10-CM | POA: Diagnosis not present

## 2019-09-06 DIAGNOSIS — S022XXD Fracture of nasal bones, subsequent encounter for fracture with routine healing: Secondary | ICD-10-CM | POA: Diagnosis not present

## 2019-09-06 DIAGNOSIS — F329 Major depressive disorder, single episode, unspecified: Secondary | ICD-10-CM | POA: Diagnosis not present

## 2019-09-06 DIAGNOSIS — G3185 Corticobasal degeneration: Secondary | ICD-10-CM | POA: Diagnosis not present

## 2019-09-06 DIAGNOSIS — I10 Essential (primary) hypertension: Secondary | ICD-10-CM | POA: Diagnosis not present

## 2019-09-06 DIAGNOSIS — G20 Parkinson's disease: Secondary | ICD-10-CM | POA: Diagnosis not present

## 2019-09-07 DIAGNOSIS — F329 Major depressive disorder, single episode, unspecified: Secondary | ICD-10-CM | POA: Diagnosis not present

## 2019-09-07 DIAGNOSIS — F0280 Dementia in other diseases classified elsewhere without behavioral disturbance: Secondary | ICD-10-CM | POA: Diagnosis not present

## 2019-09-07 DIAGNOSIS — S022XXD Fracture of nasal bones, subsequent encounter for fracture with routine healing: Secondary | ICD-10-CM | POA: Diagnosis not present

## 2019-09-07 DIAGNOSIS — I10 Essential (primary) hypertension: Secondary | ICD-10-CM | POA: Diagnosis not present

## 2019-09-07 DIAGNOSIS — G20 Parkinson's disease: Secondary | ICD-10-CM | POA: Diagnosis not present

## 2019-09-07 DIAGNOSIS — G3185 Corticobasal degeneration: Secondary | ICD-10-CM | POA: Diagnosis not present

## 2019-09-11 DIAGNOSIS — G3185 Corticobasal degeneration: Secondary | ICD-10-CM | POA: Diagnosis not present

## 2019-09-11 DIAGNOSIS — F329 Major depressive disorder, single episode, unspecified: Secondary | ICD-10-CM | POA: Diagnosis not present

## 2019-09-11 DIAGNOSIS — S022XXD Fracture of nasal bones, subsequent encounter for fracture with routine healing: Secondary | ICD-10-CM | POA: Diagnosis not present

## 2019-09-11 DIAGNOSIS — F0280 Dementia in other diseases classified elsewhere without behavioral disturbance: Secondary | ICD-10-CM | POA: Diagnosis not present

## 2019-09-11 DIAGNOSIS — I10 Essential (primary) hypertension: Secondary | ICD-10-CM | POA: Diagnosis not present

## 2019-09-11 DIAGNOSIS — G20 Parkinson's disease: Secondary | ICD-10-CM | POA: Diagnosis not present

## 2019-09-12 DIAGNOSIS — S022XXD Fracture of nasal bones, subsequent encounter for fracture with routine healing: Secondary | ICD-10-CM | POA: Diagnosis not present

## 2019-09-12 DIAGNOSIS — G20 Parkinson's disease: Secondary | ICD-10-CM | POA: Diagnosis not present

## 2019-09-12 DIAGNOSIS — G3185 Corticobasal degeneration: Secondary | ICD-10-CM | POA: Diagnosis not present

## 2019-09-12 DIAGNOSIS — I10 Essential (primary) hypertension: Secondary | ICD-10-CM | POA: Diagnosis not present

## 2019-09-12 DIAGNOSIS — F0280 Dementia in other diseases classified elsewhere without behavioral disturbance: Secondary | ICD-10-CM | POA: Diagnosis not present

## 2019-09-12 DIAGNOSIS — F329 Major depressive disorder, single episode, unspecified: Secondary | ICD-10-CM | POA: Diagnosis not present

## 2019-09-13 DIAGNOSIS — G3185 Corticobasal degeneration: Secondary | ICD-10-CM | POA: Diagnosis not present

## 2019-09-13 DIAGNOSIS — S022XXD Fracture of nasal bones, subsequent encounter for fracture with routine healing: Secondary | ICD-10-CM | POA: Diagnosis not present

## 2019-09-13 DIAGNOSIS — F0280 Dementia in other diseases classified elsewhere without behavioral disturbance: Secondary | ICD-10-CM | POA: Diagnosis not present

## 2019-09-13 DIAGNOSIS — G20 Parkinson's disease: Secondary | ICD-10-CM | POA: Diagnosis not present

## 2019-09-13 DIAGNOSIS — I10 Essential (primary) hypertension: Secondary | ICD-10-CM | POA: Diagnosis not present

## 2019-09-13 DIAGNOSIS — F329 Major depressive disorder, single episode, unspecified: Secondary | ICD-10-CM | POA: Diagnosis not present

## 2019-09-14 DIAGNOSIS — G20 Parkinson's disease: Secondary | ICD-10-CM | POA: Diagnosis not present

## 2019-09-14 DIAGNOSIS — G3185 Corticobasal degeneration: Secondary | ICD-10-CM | POA: Diagnosis not present

## 2019-09-14 DIAGNOSIS — I10 Essential (primary) hypertension: Secondary | ICD-10-CM | POA: Diagnosis not present

## 2019-09-14 DIAGNOSIS — F329 Major depressive disorder, single episode, unspecified: Secondary | ICD-10-CM | POA: Diagnosis not present

## 2019-09-14 DIAGNOSIS — S022XXD Fracture of nasal bones, subsequent encounter for fracture with routine healing: Secondary | ICD-10-CM | POA: Diagnosis not present

## 2019-09-14 DIAGNOSIS — F0280 Dementia in other diseases classified elsewhere without behavioral disturbance: Secondary | ICD-10-CM | POA: Diagnosis not present

## 2019-09-19 DIAGNOSIS — F329 Major depressive disorder, single episode, unspecified: Secondary | ICD-10-CM | POA: Diagnosis not present

## 2019-09-19 DIAGNOSIS — I10 Essential (primary) hypertension: Secondary | ICD-10-CM | POA: Diagnosis not present

## 2019-09-19 DIAGNOSIS — S022XXD Fracture of nasal bones, subsequent encounter for fracture with routine healing: Secondary | ICD-10-CM | POA: Diagnosis not present

## 2019-09-19 DIAGNOSIS — G20 Parkinson's disease: Secondary | ICD-10-CM | POA: Diagnosis not present

## 2019-09-19 DIAGNOSIS — F0280 Dementia in other diseases classified elsewhere without behavioral disturbance: Secondary | ICD-10-CM | POA: Diagnosis not present

## 2019-09-19 DIAGNOSIS — G3185 Corticobasal degeneration: Secondary | ICD-10-CM | POA: Diagnosis not present

## 2019-09-21 DIAGNOSIS — S022XXD Fracture of nasal bones, subsequent encounter for fracture with routine healing: Secondary | ICD-10-CM | POA: Diagnosis not present

## 2019-09-21 DIAGNOSIS — I10 Essential (primary) hypertension: Secondary | ICD-10-CM | POA: Diagnosis not present

## 2019-09-21 DIAGNOSIS — G20 Parkinson's disease: Secondary | ICD-10-CM | POA: Diagnosis not present

## 2019-09-21 DIAGNOSIS — F0280 Dementia in other diseases classified elsewhere without behavioral disturbance: Secondary | ICD-10-CM | POA: Diagnosis not present

## 2019-09-21 DIAGNOSIS — F329 Major depressive disorder, single episode, unspecified: Secondary | ICD-10-CM | POA: Diagnosis not present

## 2019-09-21 DIAGNOSIS — G3185 Corticobasal degeneration: Secondary | ICD-10-CM | POA: Diagnosis not present

## 2019-09-25 DIAGNOSIS — I10 Essential (primary) hypertension: Secondary | ICD-10-CM | POA: Diagnosis not present

## 2019-09-25 DIAGNOSIS — G3185 Corticobasal degeneration: Secondary | ICD-10-CM | POA: Diagnosis not present

## 2019-09-25 DIAGNOSIS — F329 Major depressive disorder, single episode, unspecified: Secondary | ICD-10-CM | POA: Diagnosis not present

## 2019-09-25 DIAGNOSIS — G20 Parkinson's disease: Secondary | ICD-10-CM | POA: Diagnosis not present

## 2019-09-25 DIAGNOSIS — S022XXD Fracture of nasal bones, subsequent encounter for fracture with routine healing: Secondary | ICD-10-CM | POA: Diagnosis not present

## 2019-09-25 DIAGNOSIS — F0280 Dementia in other diseases classified elsewhere without behavioral disturbance: Secondary | ICD-10-CM | POA: Diagnosis not present

## 2019-09-26 ENCOUNTER — Telehealth: Payer: Self-pay | Admitting: Family Medicine

## 2019-09-26 DIAGNOSIS — F329 Major depressive disorder, single episode, unspecified: Secondary | ICD-10-CM | POA: Diagnosis not present

## 2019-09-26 DIAGNOSIS — F0280 Dementia in other diseases classified elsewhere without behavioral disturbance: Secondary | ICD-10-CM | POA: Diagnosis not present

## 2019-09-26 DIAGNOSIS — I10 Essential (primary) hypertension: Secondary | ICD-10-CM | POA: Diagnosis not present

## 2019-09-26 DIAGNOSIS — G20 Parkinson's disease: Secondary | ICD-10-CM | POA: Diagnosis not present

## 2019-09-26 DIAGNOSIS — S022XXD Fracture of nasal bones, subsequent encounter for fracture with routine healing: Secondary | ICD-10-CM | POA: Diagnosis not present

## 2019-09-26 DIAGNOSIS — G3185 Corticobasal degeneration: Secondary | ICD-10-CM | POA: Diagnosis not present

## 2019-09-26 NOTE — Telephone Encounter (Signed)
Copied from [place] [id number]. Topic: General - Other >> Sep 26, 2019  1:19 PM Keene Breath wrote: Reason for CRM: Called to inform the doctor that patient has had numerous falls over the last couple of days including this morning.  She stated he hit the right side of head with a little bruising.  He is alert and oriented, but is a little fidgety and constantly moving.  Please advise and if any other questions, please call at [phone]

## 2019-09-27 ENCOUNTER — Telehealth: Payer: Self-pay

## 2019-09-27 DIAGNOSIS — G3185 Corticobasal degeneration: Secondary | ICD-10-CM | POA: Diagnosis not present

## 2019-09-27 DIAGNOSIS — G20 Parkinson's disease: Secondary | ICD-10-CM | POA: Diagnosis not present

## 2019-09-27 DIAGNOSIS — I10 Essential (primary) hypertension: Secondary | ICD-10-CM | POA: Diagnosis not present

## 2019-09-27 DIAGNOSIS — F0280 Dementia in other diseases classified elsewhere without behavioral disturbance: Secondary | ICD-10-CM | POA: Diagnosis not present

## 2019-09-27 DIAGNOSIS — F329 Major depressive disorder, single episode, unspecified: Secondary | ICD-10-CM | POA: Diagnosis not present

## 2019-09-27 DIAGNOSIS — S022XXD Fracture of nasal bones, subsequent encounter for fracture with routine healing: Secondary | ICD-10-CM | POA: Diagnosis not present

## 2019-09-27 NOTE — Telephone Encounter (Signed)
Spoke to pt's wife- was advised to go to ER for further evaluation due to risk of subdural bleeds and it can be a late manisfestation. She said she "understand what that is, but I guess I will be in the hospital all the time because of his condition." I explained that once something is reported to [place], we have to give the doctor's medical advise and can't just sit on the information, doing nothing to address it. Wife voiced understanding

## 2019-09-27 NOTE — Telephone Encounter (Signed)
Dr Ronnald Ramp spoke to both ED and Susie concerning Semaje's falls and hitting head. She explained the condition and the what might happen if pt doesn't get evaluated after having multiple falls and hitting his head. Also, advised Susie that she may want to get a "head protection" for him in the home.

## 2019-09-28 DIAGNOSIS — G3185 Corticobasal degeneration: Secondary | ICD-10-CM | POA: Diagnosis not present

## 2019-10-01 ENCOUNTER — Other Ambulatory Visit: Payer: Self-pay

## 2019-10-01 ENCOUNTER — Encounter: Payer: Self-pay | Admitting: Family Medicine

## 2019-10-01 ENCOUNTER — Ambulatory Visit (INDEPENDENT_AMBULATORY_CARE_PROVIDER_SITE_OTHER): Payer: Medicare Other | Admitting: Family Medicine

## 2019-10-01 ENCOUNTER — Ambulatory Visit
Admission: RE | Admit: 2019-10-01 | Discharge: 2019-10-01 | Disposition: A | Discharge status: Home / Self Care | Payer: Medicare Other | Source: Ambulatory Visit | Attending: Family Medicine | Admitting: Family Medicine

## 2019-10-01 ENCOUNTER — Ambulatory Visit
Admission: RE | Admit: 2019-10-01 | Discharge: 2019-10-01 | Disposition: A | Discharge status: Home / Self Care | Payer: Medicare Other | Attending: Family Medicine | Admitting: Family Medicine

## 2019-10-01 VITALS — BP 110/80 | HR 88 | Ht 68.0 in | Wt 158.0 lb

## 2019-10-01 DIAGNOSIS — M545 Low back pain: Secondary | ICD-10-CM | POA: Diagnosis not present

## 2019-10-01 DIAGNOSIS — E785 Hyperlipidemia, unspecified: Secondary | ICD-10-CM | POA: Diagnosis not present

## 2019-10-01 DIAGNOSIS — G8929 Other chronic pain: Secondary | ICD-10-CM

## 2019-10-01 DIAGNOSIS — I10 Essential (primary) hypertension: Secondary | ICD-10-CM

## 2019-10-01 DIAGNOSIS — R350 Frequency of micturition: Secondary | ICD-10-CM | POA: Diagnosis not present

## 2019-10-01 DIAGNOSIS — M5440 Lumbago with sciatica, unspecified side: Secondary | ICD-10-CM

## 2019-10-01 DIAGNOSIS — F330 Major depressive disorder, recurrent, mild: Secondary | ICD-10-CM

## 2019-10-01 MED ORDER — METOPROLOL SUCCINATE ER 100 MG PO TB24: ORAL_TABLET | ORAL | 1 refills | Status: DC

## 2019-10-01 MED ORDER — ATORVASTATIN CALCIUM 10 MG PO TABS: 10.0000 mg | ORAL_TABLET | Freq: Every day | ORAL | 1 refills | Status: DC

## 2019-10-01 MED ORDER — LOSARTAN POTASSIUM-HCTZ 50-12.5 MG PO TABS: 1.0000 | ORAL_TABLET | Freq: Every day | ORAL | 1 refills | Status: DC

## 2019-10-01 MED ORDER — SERTRALINE HCL 50 MG PO TABS: 50.0000 mg | ORAL_TABLET | Freq: Every day | ORAL | 1 refills | Status: DC

## 2019-10-01 NOTE — Progress Notes (Signed)
Date:  10/01/2019   Name:  Travis Robertson   DOB:  18-Aug-1950   MRN:  [id number]   Chief Complaint: Hypertension, Hyperlipidemia, and Depression (PHQ9=2 and GAD7=6)  Hypertension This is a chronic problem. The current episode started more than 1 year ago. The problem is unchanged. The problem is controlled. Pertinent negatives include no anxiety, blurred vision, chest pain, headaches, malaise/fatigue, neck pain, orthopnea, palpitations, peripheral edema, PND, shortness of breath or sweats. There are no associated agents to hypertension. There are no known risk factors for coronary artery disease. Past treatments include angiotensin blockers, beta blockers and diuretics. The current treatment provides moderate improvement. There are no compliance problems.  There is no history of angina, kidney disease, CAD/MI, CVA, heart failure, left ventricular hypertrophy, PVD or retinopathy. There is no history of chronic renal disease, a hypertension causing med or renovascular disease.  Hyperlipidemia This is a chronic problem. The current episode started more than 1 year ago. The problem is controlled. Recent lipid tests were reviewed and are normal. He has no history of chronic renal disease, diabetes, hypothyroidism, liver disease, obesity or nephrotic syndrome. There are no known factors aggravating his hyperlipidemia. Pertinent negatives include no chest pain, myalgias or shortness of breath. Current antihyperlipidemic treatment includes statins. The current treatment provides moderate improvement of lipids. There are no compliance problems.  Risk factors for coronary artery disease include dyslipidemia and hypertension.  Depression        This is a chronic problem.  The onset quality is gradual.   The problem occurs intermittently.  Associated symptoms include decreased interest and sad.  Associated symptoms include no decreased concentration, no fatigue, no helplessness, no hopelessness, does not have  insomnia, not irritable, no restlessness, no appetite change, no body aches, no myalgias, no headaches, no indigestion and no suicidal ideas.  Past treatments include SSRIs - Selective serotonin reuptake inhibitors.  Compliance with treatment is variable.  Past compliance problems include pharmacy issues.  Previous treatment provided mild relief.   Pertinent negatives include no hypothyroidism and no anxiety. Back Pain This is a chronic problem. The current episode started more than 1 year ago. The problem occurs daily. The problem has been waxing and waning since onset. The pain is present in the lumbar spine. The pain radiates to the right thigh and left thigh. The pain is at a severity of 7/10. Pertinent negatives include no abdominal pain, chest pain, dysuria, fever or headaches.    Lab Results  Component Value Date   CREATININE 1.13 07/24/2019   BUN 15 07/24/2019   NA 128 (L) 07/24/2019   K 3.9 07/24/2019   CL 90 (L) 07/24/2019   CO2 27 07/24/2019   Lab Results  Component Value Date   CHOL 163 10/17/2018   HDL 53 10/17/2018   LDLCALC 93 10/17/2018   TRIG 87 10/17/2018   CHOLHDL 2.8 09/01/2016   No results found for: TSH No results found for: HGBA1C Lab Results  Component Value Date   WBC 7.9 07/24/2019   HGB 15.4 07/24/2019   HCT 42.8 07/24/2019   MCV 94.9 07/24/2019   PLT 254 07/24/2019   Lab Results  Component Value Date   ALT 4 10/17/2018   AST 13 10/17/2018   ALKPHOS 110 10/17/2018   BILITOT 0.5 10/17/2018     Review of Systems  Constitutional: Negative for appetite change, chills, fatigue, fever and malaise/fatigue.  HENT: Negative for drooling, ear discharge, ear pain and sore throat.   Eyes: Negative  for blurred vision.  Respiratory: Negative for cough, shortness of breath and wheezing.   Cardiovascular: Negative for chest pain, palpitations, orthopnea, leg swelling and PND.  Gastrointestinal: Negative for abdominal pain, blood in stool, constipation,  diarrhea and nausea.  Endocrine: Negative for polydipsia.  Genitourinary: Negative for dysuria, frequency, hematuria and urgency.  Musculoskeletal: Positive for back pain. Negative for myalgias and neck pain.  Skin: Negative for rash.  Allergic/Immunologic: Negative for environmental allergies.  Neurological: Negative for dizziness and headaches.  Hematological: Does not bruise/bleed easily.  Psychiatric/Behavioral: Positive for depression. Negative for decreased concentration and suicidal ideas. The patient is not nervous/anxious and does not have insomnia.     Patient Active Problem List   Diagnosis Date Noted  . Aortic atherosclerosis ([place]) 10/17/2018  . Postoperative hypothyroidism 04/24/2018  . Depression 11/11/2017  . Thyroid cancer ([place]) 08/24/2017  . Displaced fracture of acromial process 08/16/2017  . Personal history of colonic polyps   . Benign neoplasm of descending colon   . Polyp of sigmoid colon   . Rectal polyp   . Mild episode of recurrent major depressive disorder ([place]) 07/14/2017  . 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 Corticobasal syndrome ([place]) 04/21/2017  . Parkinson disease ([place]) 04/21/2017  . Loss of memory 12/23/2016  . Rapidly progressive weakness 10/28/2016  . Ataxia 10/12/2016  . B12 deficiency 10/12/2016  . Dizziness 10/12/2016  . Essential hypertension 12/31/2014  . Hyperlipidemia 12/31/2014    No Known Allergies  Past Surgical History:  Procedure Laterality Date  . APPENDECTOMY    . CATARACT EXTRACTION Bilateral   . COLONOSCOPY  01/24/2012   cleared for 5 yrs- [facility]  . COLONOSCOPY WITH PROPOFOL N/A 07/15/2017   Procedure: COLONOSCOPY WITH PROPOFOL;  Surgeon: Lucilla Lame, MD;  Location: [place];  Service: Endoscopy;  Laterality: N/A;  . POLYPECTOMY  07/15/2017   Procedure: POLYPECTOMY INTESTINAL;  Surgeon: Lucilla Lame, MD;  Location: [place];  Service: Endoscopy;;  Ascending  colon polyp Sigmoid colon polyp x 2 Rectal polyp x 2  . RETINAL DETACHMENT SURGERY Bilateral     Social History   Tobacco Use  . Smoking status: Former Smoker    Packs/day: 1.50    Years: 40.00    Pack years: 60.00    Types: Cigarettes    Quit date: 07/2015    Years since quitting: 4.1  . Smokeless tobacco: Never Used  . Tobacco comment: smoking cessation materials not required  Substance Use Topics  . Alcohol use: Not Currently    Alcohol/week: 14.0 standard drinks    Types: 14 Cans of beer per week  . Drug use: No     Medication list has been reviewed and updated.  Current Meds  Medication Sig  . aspirin 81 MG tablet Take 81 mg by mouth daily.  Marland Kitchen atorvastatin (LIPITOR) 10 MG tablet TAKE 1 TABLET BY MOUTH EVERY DAY  . Calcium Carb-Cholecalciferol 600-800 MG-UNIT TABS   . carbidopa-levodopa (SINEMET) 25-100 MG tablet Take 3 tablets by mouth 3 (three) times daily.  . clonazePAM (KLONOPIN) 0.5 MG tablet Take 1 tablet by mouth at bedtime. neuro  . levothyroxine (SYNTHROID, LEVOTHROID) 100 MCG tablet Take 100 mcg by mouth daily. Dr [name]  . losartan-hydrochlorothiazide (HYZAAR) 50-12.5 MG tablet Take 1 tablet by mouth daily.  . metoprolol succinate (TOPROL-XL) 100 MG 24 hr tablet TAKE 1 TABLET BY MOUTH EVERY DAY WITH OR IMMEDIATELY FOLLOWING A MEAL  . mupirocin ointment (BACTROBAN) 2 %  Apply to affected area 3 times daily  . Polyethylene Glycol 3350 (PEG 3350) 17 GM/SCOOP POWD Take by mouth. PRN only  . sertraline (ZOLOFT) 50 MG tablet Take 1 tablet (50 mg total) by mouth daily.  Marland Kitchen testosterone cypionate (DEPOTESTOSTERONE CYPIONATE) 200 MG/ML injection Inject 200 mg into the muscle every 14 (fourteen) days.  . Vitamin D, Ergocalciferol, (DRISDOL) 1.25 MG (50000 UT) CAPS capsule Take 50,000 Units by mouth once a week.    PHQ 2/9 Scores 10/01/2019 08/31/2019 07/16/2019 04/02/2019  PHQ - 2 Score 0 0 0 0  PHQ- 9 Score 2 4 7  0    BP Readings from Last 3 Encounters:  10/01/19  110/80  07/24/19 (!) 150/97  07/16/19 120/70    Physical Exam Vitals and nursing note reviewed.  Constitutional:      General: He is not irritable. HENT:     Head: Normocephalic.     Right Ear: External ear normal.     Left Ear: External ear normal.     Nose: Nose normal. No congestion or rhinorrhea.  Eyes:     General: No scleral icterus.       Right eye: No discharge.        Left eye: No discharge.     Conjunctiva/sclera: Conjunctivae normal.     Pupils: Pupils are equal, round, and reactive to light.  Neck:     Thyroid: No thyromegaly.     Vascular: No JVD.     Trachea: No tracheal deviation.  Cardiovascular:     Rate and Rhythm: Normal rate and regular rhythm.     Heart sounds: Normal heart sounds. No murmur. No friction rub. No gallop.   Pulmonary:     Effort: No respiratory distress.     Breath sounds: Normal breath sounds. No wheezing, rhonchi or rales.  Abdominal:     General: Bowel sounds are normal.     Palpations: Abdomen is soft. There is no mass.     Tenderness: There is no abdominal tenderness. There is no guarding or rebound.  Musculoskeletal:        General: No tenderness. Normal range of motion.     Cervical back: Normal range of motion and neck supple.  Lymphadenopathy:     Cervical: No cervical adenopathy.  Skin:    General: Skin is warm.     Findings: No rash.  Neurological:     Mental Status: He is alert and oriented to person, place, and time.     Cranial Nerves: No cranial nerve deficit.     Deep Tendon Reflexes: Reflexes are normal and symmetric.     Wt Readings from Last 3 Encounters:  10/01/19 158 lb (71.7 kg)  07/24/19 158 lb 11.7 oz (72 kg)  07/16/19 160 lb (72.6 kg)    BP 110/80   Pulse 88   Ht 5\' 8"  (1.727 m)   Wt 158 lb (71.7 kg)   BMI 24.02 kg/m   Assessment and Plan: 1. Essential hypertension .  Controlled.  Stable.  Continue metoprolol XL 100 mg once a day and losartan-hydrochlorothiazide 50-12.5 mg once a day.   Reviewed previous renal function and we will not obtain labs at this time. - metoprolol succinate (TOPROL-XL) 100 MG 24 hr tablet; Take with or immediately following a meal.  Dispense: 90 tablet; Refill: 1 - losartan-hydrochlorothiazide (HYZAAR) 50-12.5 MG tablet; Take 1 tablet by mouth daily.  Dispense: 90 tablet; Refill: 1  2. Hyperlipidemia, unspecified hyperlipidemia type Chronic.  Controlled.  Stable.  Continue atorvastatin 10 mg once a day.  Will recheck in 6 months patient's complete panel. - atorvastatin (LIPITOR) 10 MG tablet; Take 1 tablet (10 mg total) by mouth daily.  Dispense: 90 tablet; Refill: 1  3. Mild episode of recurrent major depressive disorder (HCC) Chronic.  Controlled.  Stable.  PHQ 2 with a gad score of 6.  Patient's been doing relatively well and we will continue on sertraline 50 mg once a day and recheck in 6 months. - sertraline (ZOLOFT) 50 MG tablet; Take 1 tablet (50 mg total) by mouth daily.  Dispense: 90 tablet; Refill: 1  4. Chronic bilateral low back pain with sciatica, sciatica laterality unspecified New onset but has been in place for almost a year persistent particularly when sitting for long periods of time.  There is some radiation to the buttock area.  This is consistent with lumbosacral degenerative disease although there is a compression fracture at T12 and there may be some issues with that at this time.  Patient does have a history of osteoporosis for which he is on testosterone.  Would do an LS spine to determine if there is compression fracture and whether stabilization with injections might be an option. - DG Lumbar Spine 2-3 Views; Future  5. Frequency of urination It has been noted that patient's had frequency of urination over the past several weeks.  There is concern of the possibility of infection but this has not progressed and there is no inability to urinate and then a large amount as if there is a ball valve effect from enlarged prostate.   Patient's been given a urine cup that if we need we can obtain a urinary specimen for dipstick and ordered culture.

## 2019-10-02 ENCOUNTER — Other Ambulatory Visit: Payer: Self-pay

## 2019-10-02 ENCOUNTER — Other Ambulatory Visit: Payer: Self-pay | Admitting: Family Medicine

## 2019-10-02 DIAGNOSIS — C73 Malignant neoplasm of thyroid gland: Secondary | ICD-10-CM

## 2019-10-02 DIAGNOSIS — E785 Hyperlipidemia, unspecified: Secondary | ICD-10-CM

## 2019-10-02 DIAGNOSIS — E890 Postprocedural hypothyroidism: Secondary | ICD-10-CM

## 2019-10-02 DIAGNOSIS — I714 Abdominal aortic aneurysm, without rupture, unspecified: Secondary | ICD-10-CM

## 2019-10-02 DIAGNOSIS — R69 Illness, unspecified: Secondary | ICD-10-CM

## 2019-10-02 DIAGNOSIS — D124 Benign neoplasm of descending colon: Secondary | ICD-10-CM

## 2019-10-02 DIAGNOSIS — R531 Weakness: Secondary | ICD-10-CM

## 2019-10-02 DIAGNOSIS — I10 Essential (primary) hypertension: Secondary | ICD-10-CM

## 2019-10-02 DIAGNOSIS — R413 Other amnesia: Secondary | ICD-10-CM

## 2019-10-02 DIAGNOSIS — I700 Atherosclerosis of aorta: Secondary | ICD-10-CM

## 2019-10-02 DIAGNOSIS — R270 Ataxia, unspecified: Secondary | ICD-10-CM

## 2019-10-02 DIAGNOSIS — G20 Parkinson's disease: Secondary | ICD-10-CM

## 2019-10-02 DIAGNOSIS — G3185 Corticobasal degeneration: Secondary | ICD-10-CM

## 2019-10-02 DIAGNOSIS — E538 Deficiency of other specified B group vitamins: Secondary | ICD-10-CM

## 2019-10-02 NOTE — Progress Notes (Signed)
I received home health orders from [facility]. [facility] 07/30/19. Cert. 09/28/19 to 11/26/19 Orders are reviewed ,signed, and faxed

## 2019-10-02 NOTE — Progress Notes (Unsigned)
Referral placed to AV and V

## 2019-10-26 ENCOUNTER — Ambulatory Visit (INDEPENDENT_AMBULATORY_CARE_PROVIDER_SITE_OTHER): Payer: Medicare Other | Admitting: Vascular Surgery

## 2019-10-26 ENCOUNTER — Telehealth: Payer: Self-pay | Admitting: Family Medicine

## 2019-10-26 ENCOUNTER — Other Ambulatory Visit: Payer: Self-pay

## 2019-10-26 ENCOUNTER — Encounter (INDEPENDENT_AMBULATORY_CARE_PROVIDER_SITE_OTHER): Payer: Self-pay | Admitting: Vascular Surgery

## 2019-10-26 VITALS — BP 107/68 | HR 60 | Resp 15 | Ht 67.0 in | Wt 158.0 lb

## 2019-10-26 DIAGNOSIS — I700 Atherosclerosis of aorta: Secondary | ICD-10-CM | POA: Diagnosis not present

## 2019-10-26 DIAGNOSIS — E785 Hyperlipidemia, unspecified: Secondary | ICD-10-CM | POA: Diagnosis not present

## 2019-10-26 DIAGNOSIS — G3185 Corticobasal degeneration: Secondary | ICD-10-CM

## 2019-10-26 DIAGNOSIS — I714 Abdominal aortic aneurysm, without rupture, unspecified: Secondary | ICD-10-CM

## 2019-10-26 DIAGNOSIS — I10 Essential (primary) hypertension: Secondary | ICD-10-CM | POA: Diagnosis not present

## 2019-10-26 NOTE — Assessment & Plan Note (Signed)
blood pressure control important in reducing the progression of atherosclerotic disease and aneurysmal growth. On appropriate oral medications.  

## 2019-10-26 NOTE — Assessment & Plan Note (Signed)
lipid control important in reducing the progression of atherosclerotic disease. Continue statin therapy  

## 2019-10-26 NOTE — Assessment & Plan Note (Signed)
Fairly extensive on the plain XRay. On asa and Plavix.

## 2019-10-26 NOTE — Patient Instructions (Signed)
Abdominal Aortic Aneurysm  An aneurysm is a bulge in one of the blood vessels that carry blood away from the heart (artery). It happens when blood pushes up against a weak or damaged place in the wall of an artery. An abdominal aortic aneurysm happens in the main artery of the body (aorta). Some aneurysms may not cause problems. If it grows, it can burst or tear, causing bleeding inside the body. This is an emergency. It needs to be treated right away. What are the causes? The exact cause of this condition is not known. What increases the risk? The following may make you more likely to get this condition:  Being a male who is 60 years of age or older.  Being white (Caucasian).  Using tobacco.  Having a family history of aneurysms.  Having the following conditions: ? Hardening of the arteries (arteriosclerosis). ? Inflammation of the walls of an artery (arteritis). ? Certain genetic conditions. ? Being very overweight (obesity). ? An infection in the wall of the aorta (infectious aortitis). ? High cholesterol. ? High blood pressure (hypertension). What are the signs or symptoms? Symptoms depend on the size of the aneurysm and how fast it is growing. Most grow slowly and do not cause any symptoms. If symptoms do occur, they may include:  Pain in the belly (abdomen), side, or back.  Feeling full after eating only small amounts of food.  Feeling a throbbing lump in the belly. Symptoms that the aneurysm has burst (ruptured) include:  Sudden, very bad pain in the belly, side, or back.  Feeling sick to your stomach (nauseous).  Throwing up (vomiting).  Feeling light-headed or passing out. How is this treated? Treatment for this condition depends on:  The size of the aneurysm.  How fast it is growing.  Your age.  Your risk of having it burst. If your aneurysm is smaller than 2 inches (5 cm), your doctor may manage it by:  Checking it often to see if it is getting bigger.  You may have an imaging test (ultrasound) to check it every 3-6 months, every year, or every few years.  Giving you medicines to: ? Control blood pressure. ? Treat pain. ? Fight infection. If your aneurysm is larger than 2 inches (5 cm), you may need surgery to fix it. Follow these instructions at home: Lifestyle  Do not use any products that have nicotine or tobacco in them. This includes cigarettes, e-cigarettes, and chewing tobacco. If you need help quitting, ask your doctor.  Get regular exercise. Ask your doctor what types of exercise are best for you. Eating and drinking  Eat a heart-healthy diet. This includes eating plenty of: ? Fresh fruits and vegetables. ? Whole grains. ? Low-fat (lean) protein. ? Low-fat dairy products.  Avoid foods that are high in saturated fat and cholesterol. These foods include red meat and some dairy products.  Do not drink alcohol if: ? Your doctor tells you not to drink. ? You are pregnant, may be pregnant, or are planning to become pregnant.  If you drink alcohol: ? Limit how much you use to:  0-1 drink a day for women.  0-2 drinks a day for men. ? Be aware of how much alcohol is in your drink. In the [place] one drink equals any of these:  One typical bottle of beer (12 oz).  One-half glass of wine (5 oz).  One shot of hard liquor (1 oz). General instructions  Take over-the-counter and prescription medicines only as   told by your doctor.  Keep your blood pressure within normal limits. Ask your doctor what your blood pressure should be.  Have your blood sugar (glucose) level and cholesterol levels checked regularly. Keep your blood sugar level and cholesterol levels within normal limits.  Avoid heavy lifting and activities that take a lot of effort. Ask your doctor what activities are safe for you.  Keep all follow-up visits as told by your doctor. This is important. ? Talk to your doctor about regular screenings to see if the  aneurysm is getting bigger. Contact a doctor if you:  Have pain in your belly, side, or back.  Have a throbbing feeling in your belly.  Have a family history of aneurysms. Get help right away if you:  Have sudden, bad pain in your belly, side, or back.  Feel sick to your stomach.  Throw up.  Have trouble pooping (constipation).  Have trouble peeing (urinating).  Feel light-headed.  Have a fast heart rate when you stand.  Have sweaty skin that is cold to the touch (clammy).  Have shortness of breath.  Have a fever. These symptoms may be an emergency. Do not wait to see if the symptoms will go away. Get medical help right away. Call your local emergency services (911 in the [place] Do not drive yourself to the hospital. Summary  An aneurysm is a bulge in one of the blood vessels that carry blood away from the heart (artery). Some aneurysms may not cause problems.  You may need to have yours checked often. If it grows, it can burst or tear. This causes bleeding inside the body. It needs to be treated right away.  Follow instructions from your doctor about healthy lifestyle changes.  Keep all follow-up visits as told by your doctor. This is important. This information is not intended to replace advice given to you by your health care provider. Make sure you discuss any questions you have with your health care provider. Document Revised: 07/31/2018 Document Reviewed: 11/19/2017 Elsevier Patient Education  2020 [employer] Inc.  

## 2019-10-26 NOTE — Progress Notes (Signed)
Patient ID: Travis Robertson, male   DOB: 08-09-1950, 69 y.o.   MRN: [id number]  Chief Complaint  Patient presents with   New Patient (Initial Visit)    ref Ronnald Ramp AAA 4.8cm    HPI Travis Robertson is a 69 y.o. male.  I am asked to see the patient by Dr. Ronnald Ramp for evaluation of an abdominal aortic aneurysm seen incidentally on plain x-rays of the lumbosacral spine.  I have independently reviewed these x-rays.  This is best seen in the lateral projection.  The comment on the official read is it may measure up to 4.8 cm in maximal diameter, but plain film evaluation of an aneurysm is not particularly accurate.  His wife provides the history today as he is really unable to provide this because of his neurologic condition.  She says they have known about this aneurysm for up to 15 years.  A little over 2 years ago, it was three-point something centimeters although they do not know the exact size.   Past Medical History:  Diagnosis Date   Arthritis    hands   Corticobasal syndrome ([place])    Hyperlipidemia    Hypertension    Impingement syndrome of shoulder    Parkinson disease ([place])     Past Surgical History:  Procedure Laterality Date   APPENDECTOMY     CATARACT EXTRACTION Bilateral    COLONOSCOPY  01/24/2012   cleared for 5 yrs- [facility]   COLONOSCOPY WITH PROPOFOL N/A 07/15/2017   Procedure: COLONOSCOPY WITH PROPOFOL;  Surgeon: Lucilla Lame, MD;  Location: [facility];  Service: Endoscopy;  Laterality: N/A;   POLYPECTOMY  07/15/2017   Procedure: POLYPECTOMY INTESTINAL;  Surgeon: Lucilla Lame, MD;  Location: [facility];  Service: Endoscopy;;  Ascending colon polyp Sigmoid colon polyp x 2 Rectal polyp x 2   RETINAL DETACHMENT SURGERY Bilateral      Family History  Problem Relation Age of Onset   Dementia Mother    Stroke Father   No bleeding or clotting disorders No aneurysms   Social History   Tobacco Use   Smoking status: Former  Smoker    Packs/day: 1.50    Years: 40.00    Pack years: 60.00    Types: Cigarettes    Quit date: 07/2015    Years since quitting: 4.2   Smokeless tobacco: Never Used   Tobacco comment: smoking cessation materials not required  Vaping Use   Vaping Use: Never used  Substance Use Topics   Alcohol use: Not Currently    Alcohol/week: 14.0 standard drinks    Types: 14 Cans of beer per week   Drug use: No     No Known Allergies  Current Outpatient Medications  Medication Sig Dispense Refill   aspirin 81 MG tablet Take 81 mg by mouth daily.     atorvastatin (LIPITOR) 10 MG tablet Take 1 tablet (10 mg total) by mouth daily. 90 tablet 1   Calcium Carb-Cholecalciferol 600-800 MG-UNIT TABS      carbidopa-levodopa (SINEMET) 25-100 MG tablet Take 3 tablets by mouth 3 (three) times daily.     clonazePAM (KLONOPIN) 0.5 MG tablet Take 1 tablet by mouth at bedtime. neuro     levothyroxine (SYNTHROID, LEVOTHROID) 100 MCG tablet Take 100 mcg by mouth daily. Dr [name]  3   losartan-hydrochlorothiazide (HYZAAR) 50-12.5 MG tablet Take 1 tablet by mouth daily. 90 tablet 1   metoprolol succinate (TOPROL-XL) 100 MG 24 hr tablet Take with  or immediately following a meal. 90 tablet 1   mupirocin ointment (BACTROBAN) 2 % Apply to affected area 3 times daily 22 g 0   Polyethylene Glycol 3350 (PEG 3350) 17 GM/SCOOP POWD Take by mouth. PRN only     sertraline (ZOLOFT) 50 MG tablet Take 1 tablet (50 mg total) by mouth daily. 90 tablet 1   testosterone cypionate (DEPOTESTOSTERONE CYPIONATE) 200 MG/ML injection Inject 200 mg into the muscle every 14 (fourteen) days.     Vitamin D, Ergocalciferol, (DRISDOL) 1.25 MG (50000 UT) CAPS capsule Take 50,000 Units by mouth once a week.     No current facility-administered medications for this visit.      REVIEW OF SYSTEMS (Negative unless checked)  Constitutional: [] Weight loss  [] Fever  [] Chills Cardiac: [] Chest pain   [] Chest pressure    [] Palpitations   [] Shortness of breath when laying flat   [] Shortness of breath at rest   [x] Shortness of breath with exertion. Vascular:  [] Pain in legs with walking   [] Pain in legs at rest   [] Pain in legs when laying flat   [] Claudication   [] Pain in feet when walking  [] Pain in feet at rest  [] Pain in feet when laying flat   [] History of DVT   [] Phlebitis   [] Swelling in legs   [] Varicose veins   [] Non-healing ulcers Pulmonary:   [] Uses home oxygen   [] Productive cough   [] Hemoptysis   [] Wheeze  [] COPD   [] Asthma Neurologic:  [] Dizziness  [] Blackouts   [] Seizures   [] History of stroke   [] History of TIA  [] Aphasia   [] Temporary blindness   [] Dysphagia   [] Weakness or numbness in arms   [] Weakness or numbness in legs Musculoskeletal:  [x] Arthritis   [] Joint swelling   [] Joint pain   [x] Low back pain Hematologic:  [] Easy bruising  [] Easy bleeding   [] Hypercoagulable state   [] Anemic  [] Hepatitis Gastrointestinal:  [] Blood in stool   [] Vomiting blood  [] Gastroesophageal reflux/heartburn   [] Abdominal pain Genitourinary:  [] Chronic kidney disease   [] Difficult urination  [] Frequent urination  [] Burning with urination   [] Hematuria Skin:  [] Rashes   [] Ulcers   [] Wounds Psychological:  [] History of anxiety   []  History of major depression.    Physical Exam BP 107/68 (BP Location: Right Arm)    Pulse 60    Resp 15    Ht 5\' 7"  (1.702 m)    Wt 158 lb (71.7 kg)    BMI 24.75 kg/m  Gen:  WD/WN, NAD Head: [place]/AT, No temporalis wasting.  Ear/Nose/Throat: Hearing grossly intact, nares w/o erythema or drainage, oropharynx w/o Erythema/Exudate Eyes: Conjunctiva clear, sclera non-icteric  Neck: trachea midline.  No JVD.  Pulmonary:  Good air movement, respirations not labored, no use of accessory muscles  Cardiac: RRR, no JVD Vascular:  Vessel Right Left  Radial Palpable Palpable                                   Gastrointestinal:. No masses, surgical incisions, or scars.  Aortic impulse not  increased significantly Musculoskeletal: Diffuse weakness.  Extremities without ischemic changes.  No deformity or atrophy.  Mild lower extremity edema.  In a wheelchair. Neurologic: Sensation grossly intact in extremities.  Symmetrical.  Speech is minimal and difficult to discern.  In a wheelchair. Psychiatric: Judgment intact, Mood & affect appropriate for pt's clinical situation. Dermatologic: No rashes or ulcers noted.  No cellulitis or open wounds.  Radiology DG Lumbar Spine 2-3 Views  Result Date: 10/02/2019 CLINICAL DATA:  Chronic bilateral low back pain EXAM: LUMBAR SPINE - 2-3 VIEW COMPARISON:  None. FINDINGS: Diffuse degenerative disc and facet disease throughout the lumbar spine. No malalignment or fracture. SI joints symmetric and unremarkable. Aortic atherosclerosis. Bulbous appearance of the distal aorta which measures up to 4.8 cm by plain film, but there may be magnification. IMPRESSION: Degenerative disc and facet disease.  No acute bony abnormality. Suspect focal distal abdominal aortic aneurysm. This could be further evaluated with CT or ultrasound. Electronically Signed   By: Rolm Baptise M.D.   On: 10/02/2019 09:17    Labs No results found for this or any previous visit (from the past 2160 hour(s)).  Assessment/Plan:  Essential hypertension blood pressure control important in reducing the progression of atherosclerotic disease and aneurysmal growth. On appropriate oral medications.   Hyperlipidemia lipid control important in reducing the progression of atherosclerotic disease. Continue statin therapy   Aortic atherosclerosis (HCC) Fairly extensive on the plain XRay. On asa and Plavix.  AAA (abdominal aortic aneurysm) without rupture ([place]) The patient does have an abdominal aortic aneurysm that is seen on his plain x-rays.  We need to get a formal aortic duplex to help evaluate the measurement of the aorta accurately.  If this is in fact approaching 5 cm, we may  discuss repair.  We discussed the pathophysiology and natural history of abdominal aortic aneurysms.  We discussed the reason and rationale for treatment to avoid the risk of rupture which is lethal.  We discussed the importance of blood pressure control and maintaining avoidance of tobacco to limit progression.  We will see him back in the near future with his noninvasive studies.  Corticobasal syndrome [facility]) The wife describes this as a Parkinson's variant.  He is clearly quite debilitated from this.  He is unsure whether or not he would have repair if this is in fact approaching 5 cm at this time secondary to his neurologic condition.      Leotis Pain 10/26/2019, 10:27 AM   This note was created with Dragon medical transcription system.  Any errors from dictation are unintentional.

## 2019-10-26 NOTE — Assessment & Plan Note (Signed)
The wife describes this as a Parkinson's variant.  He is clearly quite debilitated from this.  He is unsure whether or not he would have repair if this is in fact approaching 5 cm at this time secondary to his neurologic condition.

## 2019-10-26 NOTE — Telephone Encounter (Signed)
Cletis Athens with [facility] calling to ask for a new rx for the pt.  [place] for the pt's red bottom. Pt has a little redness and they do not want it to get any worse and breakdown.  [id number] - [place], [place] Phone:  [phone]  Fax:  [phone]      cb  [phone]

## 2019-10-26 NOTE — Assessment & Plan Note (Signed)
The patient does have an abdominal aortic aneurysm that is seen on his plain x-rays.  We need to get a formal aortic duplex to help evaluate the measurement of the aorta accurately.  If this is in fact approaching 5 cm, we may discuss repair.  We discussed the pathophysiology and natural history of abdominal aortic aneurysms.  We discussed the reason and rationale for treatment to avoid the risk of rupture which is lethal.  We discussed the importance of blood pressure control and maintaining avoidance of tobacco to limit progression.  We will see him back in the near future with his noninvasive studies.

## 2019-10-28 DIAGNOSIS — G3185 Corticobasal degeneration: Secondary | ICD-10-CM | POA: Diagnosis not present

## 2019-10-28 DIAGNOSIS — F0280 Dementia in other diseases classified elsewhere without behavioral disturbance: Secondary | ICD-10-CM | POA: Diagnosis not present

## 2019-10-28 DIAGNOSIS — E890 Postprocedural hypothyroidism: Secondary | ICD-10-CM | POA: Diagnosis not present

## 2019-10-28 DIAGNOSIS — G20 Parkinson's disease: Secondary | ICD-10-CM | POA: Diagnosis not present

## 2019-10-28 DIAGNOSIS — F329 Major depressive disorder, single episode, unspecified: Secondary | ICD-10-CM | POA: Diagnosis not present

## 2019-10-28 DIAGNOSIS — Z9181 History of falling: Secondary | ICD-10-CM | POA: Diagnosis not present

## 2019-10-28 DIAGNOSIS — S022XXD Fracture of nasal bones, subsequent encounter for fracture with routine healing: Secondary | ICD-10-CM | POA: Diagnosis not present

## 2019-10-28 DIAGNOSIS — E785 Hyperlipidemia, unspecified: Secondary | ICD-10-CM | POA: Diagnosis not present

## 2019-10-28 DIAGNOSIS — R296 Repeated falls: Secondary | ICD-10-CM | POA: Diagnosis not present

## 2019-10-28 DIAGNOSIS — E538 Deficiency of other specified B group vitamins: Secondary | ICD-10-CM | POA: Diagnosis not present

## 2019-10-28 DIAGNOSIS — L89152 Pressure ulcer of sacral region, stage 2: Secondary | ICD-10-CM | POA: Diagnosis not present

## 2019-10-28 DIAGNOSIS — I10 Essential (primary) hypertension: Secondary | ICD-10-CM | POA: Diagnosis not present

## 2019-10-28 DIAGNOSIS — Z85850 Personal history of malignant neoplasm of thyroid: Secondary | ICD-10-CM | POA: Diagnosis not present

## 2019-10-28 DIAGNOSIS — Z87891 Personal history of nicotine dependence: Secondary | ICD-10-CM | POA: Diagnosis not present

## 2019-10-28 DIAGNOSIS — I700 Atherosclerosis of aorta: Secondary | ICD-10-CM | POA: Diagnosis not present

## 2019-10-30 NOTE — Telephone Encounter (Signed)
Called in [facility] jar of ointment- apply after bath as needed for skin redness/ breakdown. Left on vm at pharmacy per Cletis Athens at [facility]

## 2019-10-31 DIAGNOSIS — F0280 Dementia in other diseases classified elsewhere without behavioral disturbance: Secondary | ICD-10-CM | POA: Diagnosis not present

## 2019-10-31 DIAGNOSIS — G3185 Corticobasal degeneration: Secondary | ICD-10-CM | POA: Diagnosis not present

## 2019-10-31 DIAGNOSIS — S022XXD Fracture of nasal bones, subsequent encounter for fracture with routine healing: Secondary | ICD-10-CM | POA: Diagnosis not present

## 2019-10-31 DIAGNOSIS — F329 Major depressive disorder, single episode, unspecified: Secondary | ICD-10-CM | POA: Diagnosis not present

## 2019-10-31 DIAGNOSIS — L89152 Pressure ulcer of sacral region, stage 2: Secondary | ICD-10-CM | POA: Diagnosis not present

## 2019-10-31 DIAGNOSIS — G20 Parkinson's disease: Secondary | ICD-10-CM | POA: Diagnosis not present

## 2019-11-01 DIAGNOSIS — G3185 Corticobasal degeneration: Secondary | ICD-10-CM | POA: Diagnosis not present

## 2019-11-01 DIAGNOSIS — F329 Major depressive disorder, single episode, unspecified: Secondary | ICD-10-CM | POA: Diagnosis not present

## 2019-11-01 DIAGNOSIS — R2689 Other abnormalities of gait and mobility: Secondary | ICD-10-CM | POA: Diagnosis not present

## 2019-11-01 DIAGNOSIS — G20 Parkinson's disease: Secondary | ICD-10-CM | POA: Diagnosis not present

## 2019-11-01 DIAGNOSIS — F0280 Dementia in other diseases classified elsewhere without behavioral disturbance: Secondary | ICD-10-CM | POA: Diagnosis not present

## 2019-11-01 DIAGNOSIS — G4701 Insomnia due to medical condition: Secondary | ICD-10-CM | POA: Diagnosis not present

## 2019-11-01 DIAGNOSIS — S022XXD Fracture of nasal bones, subsequent encounter for fracture with routine healing: Secondary | ICD-10-CM | POA: Diagnosis not present

## 2019-11-01 DIAGNOSIS — L89152 Pressure ulcer of sacral region, stage 2: Secondary | ICD-10-CM | POA: Diagnosis not present

## 2019-11-07 DIAGNOSIS — F0280 Dementia in other diseases classified elsewhere without behavioral disturbance: Secondary | ICD-10-CM | POA: Diagnosis not present

## 2019-11-07 DIAGNOSIS — S022XXD Fracture of nasal bones, subsequent encounter for fracture with routine healing: Secondary | ICD-10-CM | POA: Diagnosis not present

## 2019-11-07 DIAGNOSIS — F329 Major depressive disorder, single episode, unspecified: Secondary | ICD-10-CM | POA: Diagnosis not present

## 2019-11-07 DIAGNOSIS — G20 Parkinson's disease: Secondary | ICD-10-CM | POA: Diagnosis not present

## 2019-11-07 DIAGNOSIS — L89152 Pressure ulcer of sacral region, stage 2: Secondary | ICD-10-CM | POA: Diagnosis not present

## 2019-11-07 DIAGNOSIS — G3185 Corticobasal degeneration: Secondary | ICD-10-CM | POA: Diagnosis not present

## 2019-11-09 DIAGNOSIS — F329 Major depressive disorder, single episode, unspecified: Secondary | ICD-10-CM | POA: Diagnosis not present

## 2019-11-09 DIAGNOSIS — L89152 Pressure ulcer of sacral region, stage 2: Secondary | ICD-10-CM | POA: Diagnosis not present

## 2019-11-09 DIAGNOSIS — F0280 Dementia in other diseases classified elsewhere without behavioral disturbance: Secondary | ICD-10-CM | POA: Diagnosis not present

## 2019-11-09 DIAGNOSIS — S022XXD Fracture of nasal bones, subsequent encounter for fracture with routine healing: Secondary | ICD-10-CM | POA: Diagnosis not present

## 2019-11-09 DIAGNOSIS — G3185 Corticobasal degeneration: Secondary | ICD-10-CM | POA: Diagnosis not present

## 2019-11-09 DIAGNOSIS — G20 Parkinson's disease: Secondary | ICD-10-CM | POA: Diagnosis not present

## 2019-11-12 ENCOUNTER — Ambulatory Visit (INDEPENDENT_AMBULATORY_CARE_PROVIDER_SITE_OTHER): Payer: Medicare Other | Admitting: Nurse Practitioner

## 2019-11-12 ENCOUNTER — Other Ambulatory Visit (INDEPENDENT_AMBULATORY_CARE_PROVIDER_SITE_OTHER): Payer: Medicare Other

## 2019-11-14 DIAGNOSIS — S022XXD Fracture of nasal bones, subsequent encounter for fracture with routine healing: Secondary | ICD-10-CM | POA: Diagnosis not present

## 2019-11-14 DIAGNOSIS — L89152 Pressure ulcer of sacral region, stage 2: Secondary | ICD-10-CM | POA: Diagnosis not present

## 2019-11-14 DIAGNOSIS — G3185 Corticobasal degeneration: Secondary | ICD-10-CM | POA: Diagnosis not present

## 2019-11-14 DIAGNOSIS — G20 Parkinson's disease: Secondary | ICD-10-CM | POA: Diagnosis not present

## 2019-11-14 DIAGNOSIS — F0280 Dementia in other diseases classified elsewhere without behavioral disturbance: Secondary | ICD-10-CM | POA: Diagnosis not present

## 2019-11-14 DIAGNOSIS — F329 Major depressive disorder, single episode, unspecified: Secondary | ICD-10-CM | POA: Diagnosis not present

## 2019-11-15 DIAGNOSIS — L89152 Pressure ulcer of sacral region, stage 2: Secondary | ICD-10-CM | POA: Diagnosis not present

## 2019-11-15 DIAGNOSIS — F0280 Dementia in other diseases classified elsewhere without behavioral disturbance: Secondary | ICD-10-CM | POA: Diagnosis not present

## 2019-11-15 DIAGNOSIS — G20 Parkinson's disease: Secondary | ICD-10-CM | POA: Diagnosis not present

## 2019-11-15 DIAGNOSIS — G3185 Corticobasal degeneration: Secondary | ICD-10-CM | POA: Diagnosis not present

## 2019-11-15 DIAGNOSIS — F329 Major depressive disorder, single episode, unspecified: Secondary | ICD-10-CM | POA: Diagnosis not present

## 2019-11-15 DIAGNOSIS — S022XXD Fracture of nasal bones, subsequent encounter for fracture with routine healing: Secondary | ICD-10-CM | POA: Diagnosis not present

## 2019-11-20 DIAGNOSIS — L89152 Pressure ulcer of sacral region, stage 2: Secondary | ICD-10-CM | POA: Diagnosis not present

## 2019-11-20 DIAGNOSIS — F329 Major depressive disorder, single episode, unspecified: Secondary | ICD-10-CM | POA: Diagnosis not present

## 2019-11-20 DIAGNOSIS — G3185 Corticobasal degeneration: Secondary | ICD-10-CM | POA: Diagnosis not present

## 2019-11-20 DIAGNOSIS — G20 Parkinson's disease: Secondary | ICD-10-CM | POA: Diagnosis not present

## 2019-11-20 DIAGNOSIS — S022XXD Fracture of nasal bones, subsequent encounter for fracture with routine healing: Secondary | ICD-10-CM | POA: Diagnosis not present

## 2019-11-20 DIAGNOSIS — F0280 Dementia in other diseases classified elsewhere without behavioral disturbance: Secondary | ICD-10-CM | POA: Diagnosis not present

## 2019-11-22 ENCOUNTER — Other Ambulatory Visit: Payer: Self-pay | Admitting: Family Medicine

## 2019-11-22 DIAGNOSIS — E785 Hyperlipidemia, unspecified: Secondary | ICD-10-CM

## 2019-11-26 DIAGNOSIS — S022XXD Fracture of nasal bones, subsequent encounter for fracture with routine healing: Secondary | ICD-10-CM | POA: Diagnosis not present

## 2019-11-26 DIAGNOSIS — F0280 Dementia in other diseases classified elsewhere without behavioral disturbance: Secondary | ICD-10-CM | POA: Diagnosis not present

## 2019-11-26 DIAGNOSIS — L89152 Pressure ulcer of sacral region, stage 2: Secondary | ICD-10-CM | POA: Diagnosis not present

## 2019-11-26 DIAGNOSIS — G3185 Corticobasal degeneration: Secondary | ICD-10-CM | POA: Diagnosis not present

## 2019-11-26 DIAGNOSIS — G20 Parkinson's disease: Secondary | ICD-10-CM | POA: Diagnosis not present

## 2019-11-26 DIAGNOSIS — F329 Major depressive disorder, single episode, unspecified: Secondary | ICD-10-CM | POA: Diagnosis not present

## 2019-12-14 ENCOUNTER — Encounter: Payer: Self-pay | Admitting: Family Medicine

## 2020-01-12 DIAGNOSIS — Z20822 Contact with and (suspected) exposure to covid-19: Secondary | ICD-10-CM | POA: Diagnosis not present

## 2020-01-30 DIAGNOSIS — Z23 Encounter for immunization: Secondary | ICD-10-CM | POA: Diagnosis not present

## 2020-01-31 ENCOUNTER — Telehealth: Payer: Self-pay | Admitting: Family Medicine

## 2020-01-31 ENCOUNTER — Other Ambulatory Visit: Payer: Self-pay | Admitting: Family Medicine

## 2020-01-31 DIAGNOSIS — F330 Major depressive disorder, recurrent, mild: Secondary | ICD-10-CM

## 2020-01-31 NOTE — Telephone Encounter (Signed)
Last Rx sent to [facility]- change in pharmacy requested- remainder of Rx sent to local pharmacy

## 2020-02-14 ENCOUNTER — Encounter: Payer: Self-pay | Admitting: Family Medicine

## 2020-03-03 DIAGNOSIS — G3185 Corticobasal degeneration: Secondary | ICD-10-CM | POA: Diagnosis not present

## 2020-03-03 DIAGNOSIS — G4701 Insomnia due to medical condition: Secondary | ICD-10-CM | POA: Diagnosis not present

## 2020-03-03 DIAGNOSIS — R2689 Other abnormalities of gait and mobility: Secondary | ICD-10-CM | POA: Diagnosis not present

## 2020-03-13 DIAGNOSIS — Z23 Encounter for immunization: Secondary | ICD-10-CM | POA: Diagnosis not present

## 2020-03-31 ENCOUNTER — Ambulatory Visit (INDEPENDENT_AMBULATORY_CARE_PROVIDER_SITE_OTHER): Payer: Medicare Other

## 2020-03-31 DIAGNOSIS — Z0000 Encounter for general adult medical examination without abnormal findings: Secondary | ICD-10-CM | POA: Diagnosis not present

## 2020-03-31 NOTE — Patient Instructions (Signed)
Travis Robertson , Thank you for taking time to come for your Medicare Wellness Visit. I appreciate your ongoing commitment to your health goals. Please review the following plan we discussed and let me know if I can assist you in the future.   Screening recommendations/referrals: Colonoscopy: done 07/15/17 Recommended yearly ophthalmology/optometry visit for glaucoma screening and checkup Recommended yearly dental visit for hygiene and checkup  Vaccinations: Influenza vaccine: done 01/30/20 Pneumococcal vaccine: done 02/25/17 Tdap vaccine: done 03/08/16 Shingles vaccine: Please discuss Shingrix vaccine with neurology   Covid-19: done 06/20/2019, 07/18/2019,03/13/2020  Conditions/risks identified: Recommend drinking 6-8 glasses of water per day   Next appointment: Follow up in one year for your annual wellness visit.   Preventive Care 79 Years and Older, Male Preventive care refers to lifestyle choices and visits with your health care provider that can promote health and wellness. What does preventive care include?  A yearly physical exam. This is also called an annual well check.  Dental exams once or twice a year.  Routine eye exams. Ask your health care provider how often you should have your eyes checked.  Personal lifestyle choices, including:  Daily care of your teeth and gums.  Regular physical activity.  Eating a healthy diet.  Avoiding tobacco and drug use.  Limiting alcohol use.  Practicing safe sex.  Taking low doses of aspirin every day.  Taking vitamin and mineral supplements as recommended by your health care provider. What happens during an annual well check? The services and screenings done by your health care provider during your annual well check will depend on your age, overall health, lifestyle risk factors, and family history of disease. Counseling  Your health care provider may ask you questions about your:  Alcohol use.  Tobacco use.  Drug  use.  Emotional well-being.  Home and relationship well-being.  Sexual activity.  Eating habits.  History of falls.  Memory and ability to understand (cognition).  Work and work Statistician. Screening  You may have the following tests or measurements:  Height, weight, and BMI.  Blood pressure.  Lipid and cholesterol levels. These may be checked every 5 years, or more frequently if you are over 49 years old.  Skin check.  Lung cancer screening. You may have this screening every year starting at age 35 if you have a 30-pack-year history of smoking and currently smoke or have quit within the past 15 years.  Fecal occult blood test (FOBT) of the stool. You may have this test every year starting at age 21.  Flexible sigmoidoscopy or colonoscopy. You may have a sigmoidoscopy every 5 years or a colonoscopy every 10 years starting at age 47.  Prostate cancer screening. Recommendations will vary depending on your family history and other risks.  Hepatitis C blood test.  Hepatitis B blood test.  Sexually transmitted disease (STD) testing.  Diabetes screening. This is done by checking your blood sugar (glucose) after you have not eaten for a while (fasting). You may have this done every 1-3 years.  Abdominal aortic aneurysm (AAA) screening. You may need this if you are a current or former smoker.  Osteoporosis. You may be screened starting at age 68 if you are at high risk. Talk with your health care provider about your test results, treatment options, and if necessary, the need for more tests. Vaccines  Your health care provider may recommend certain vaccines, such as:  Influenza vaccine. This is recommended every year.  Tetanus, diphtheria, and acellular pertussis (Tdap, Td) vaccine.  You may need a Td booster every 10 years.  Zoster vaccine. You may need this after age 29.  Pneumococcal 13-valent conjugate (PCV13) vaccine. One dose is recommended after age  37.  Pneumococcal polysaccharide (PPSV23) vaccine. One dose is recommended after age 102. Talk to your health care provider about which screenings and vaccines you need and how often you need them. This information is not intended to replace advice given to you by your health care provider. Make sure you discuss any questions you have with your health care provider. Document Released: 05/09/2015 Document Revised: 12/31/2015 Document Reviewed: 02/11/2015 Elsevier Interactive Patient Education  2017 [place] Prevention in the Home Falls can cause injuries. They can happen to people of all ages. There are many things you can do to make your home safe and to help prevent falls. What can I do on the outside of my home?  Regularly fix the edges of walkways and driveways and fix any cracks.  Remove anything that might make you trip as you walk through a door, such as a raised step or threshold.  Trim any bushes or trees on the path to your home.  Use bright outdoor lighting.  Clear any walking paths of anything that might make someone trip, such as rocks or tools.  Regularly check to see if handrails are loose or broken. Make sure that both sides of any steps have handrails.  Any raised decks and porches should have guardrails on the edges.  Have any leaves, snow, or ice cleared regularly.  Use sand or salt on walking paths during winter.  Clean up any spills in your garage right away. This includes oil or grease spills. What can I do in the bathroom?  Use night lights.  Install grab bars by the toilet and in the tub and shower. Do not use towel bars as grab bars.  Use non-skid mats or decals in the tub or shower.  If you need to sit down in the shower, use a plastic, non-slip stool.  Keep the floor dry. Clean up any water that spills on the floor as soon as it happens.  Remove soap buildup in the tub or shower regularly.  Attach bath mats securely with double-sided  non-slip rug tape.  Do not have throw rugs and other things on the floor that can make you trip. What can I do in the bedroom?  Use night lights.  Make sure that you have a light by your bed that is easy to reach.  Do not use any sheets or blankets that are too big for your bed. They should not hang down onto the floor.  Have a firm chair that has side arms. You can use this for support while you get dressed.  Do not have throw rugs and other things on the floor that can make you trip. What can I do in the kitchen?  Clean up any spills right away.  Avoid walking on wet floors.  Keep items that you use a lot in easy-to-reach places.  If you need to reach something above you, use a strong step stool that has a grab bar.  Keep electrical cords out of the way.  Do not use floor polish or wax that makes floors slippery. If you must use wax, use non-skid floor wax.  Do not have throw rugs and other things on the floor that can make you trip. What can I do with my stairs?  Do not leave any  items on the stairs.  Make sure that there are handrails on both sides of the stairs and use them. Fix handrails that are broken or loose. Make sure that handrails are as long as the stairways.  Check any carpeting to make sure that it is firmly attached to the stairs. Fix any carpet that is loose or worn.  Avoid having throw rugs at the top or bottom of the stairs. If you do have throw rugs, attach them to the floor with carpet tape.  Make sure that you have a light switch at the top of the stairs and the bottom of the stairs. If you do not have them, ask someone to add them for you. What else can I do to help prevent falls?  Wear shoes that:  Do not have high heels.  Have rubber bottoms.  Are comfortable and fit you well.  Are closed at the toe. Do not wear sandals.  If you use a stepladder:  Make sure that it is fully opened. Do not climb a closed stepladder.  Make sure that both  sides of the stepladder are locked into place.  Ask someone to hold it for you, if possible.  Clearly mark and make sure that you can see:  Any grab bars or handrails.  First and last steps.  Where the edge of each step is.  Use tools that help you move around (mobility aids) if they are needed. These include:  Canes.  Walkers.  Scooters.  Crutches.  Turn on the lights when you go into a dark area. Replace any light bulbs as soon as they burn out.  Set up your furniture so you have a clear path. Avoid moving your furniture around.  If any of your floors are uneven, fix them.  If there are any pets around you, be aware of where they are.  Review your medicines with your doctor. Some medicines can make you feel dizzy. This can increase your chance of falling. Ask your doctor what other things that you can do to help prevent falls. This information is not intended to replace advice given to you by your health care provider. Make sure you discuss any questions you have with your health care provider. Document Released: 02/06/2009 Document Revised: 09/18/2015 Document Reviewed: 05/17/2014 Elsevier Interactive Patient Education  2017 [employer].

## 2020-03-31 NOTE — Progress Notes (Signed)
Subjective:   Travis Robertson is a 69 y.o. male who presents for Medicare Annual/Subsequent preventive examination.  Virtual Visit via Telephone Note  I connected with  Travis Robertson on 03/31/20 at 11:20 AM EST by telephone and verified that I am speaking with the correct person using two identifiers.  Location: Patient: home Provider: [facility] Persons participating in the virtual visit: patient and [name]   I discussed the limitations, risks, security and privacy concerns of performing an evaluation and management service by telephone and the availability of in person appointments. The patient expressed understanding and agreed to proceed.  Interactive audio and video telecommunications were attempted between this nurse and patient, however failed, due to patient having technical difficulties OR patient did not have access to video capability.  We continued and completed visit with audio only.  Some vital signs may be absent or patient reported.   [name], LPN    Review of Systems     Cardiac Risk Factors include: advanced age (>15men, >64 women);dyslipidemia;male gender;hypertension;sedentary lifestyle     Objective:    There were no vitals filed for this visit. There is no height or weight on file to calculate BMI.  Advanced Directives 03/31/2020 03/28/2019 07/15/2017 03/28/2017 12/31/2014  Does Patient Have a Medical Advance Directive? Yes Yes Yes Yes No  Type of Paramedic of [place];Living will [place];Living will [place];Living will [place];Living will -  Does patient want to make changes to medical advance directive? - - No - Patient declined - -  Copy of [place] in Chart? No - copy requested Yes - validated most recent copy scanned in chart (See row information) Yes - -    Current Medications (verified) Outpatient Encounter  Medications as of 03/31/2020  Medication Sig  . aspirin 81 MG tablet Take 81 mg by mouth daily.  Marland Kitchen atorvastatin (LIPITOR) 10 MG tablet TAKE 1 TABLET BY MOUTH EVERY DAY  . Calcium Carb-Cholecalciferol 600-800 MG-UNIT TABS   . carbidopa-levodopa (SINEMET) 25-100 MG tablet Take 3 tablets by mouth 3 (three) times daily.  . clonazePAM (KLONOPIN) 0.5 MG tablet Take 1 tablet by mouth at bedtime. neuro  . Infant Care Products (DERMACLOUD) CREA Apply topically daily as needed.  Marland Kitchen levothyroxine (SYNTHROID, LEVOTHROID) 100 MCG tablet Take 100 mcg by mouth daily. Dr [name]  . losartan-hydrochlorothiazide (HYZAAR) 50-12.5 MG tablet Take 1 tablet by mouth daily.  . metoprolol succinate (TOPROL-XL) 100 MG 24 hr tablet Take with or immediately following a meal.  . Polyethylene Glycol 3350 (PEG 3350) 17 GM/SCOOP POWD Take by mouth. PRN only  . QUEtiapine (SEROQUEL) 25 MG tablet Take 25 mg by mouth at bedtime.  . sertraline (ZOLOFT) 50 MG tablet TAKE 1 TABLET BY MOUTH EVERY DAY  . testosterone cypionate (DEPOTESTOSTERONE CYPIONATE) 200 MG/ML injection Inject into the muscle. Inject 0.5 mls (100 mg) every 14 days  . Vitamin D, Ergocalciferol, (DRISDOL) 1.25 MG (50000 UT) CAPS capsule Take 50,000 Units by mouth once a week.  . [DISCONTINUED] mupirocin ointment (BACTROBAN) 2 % Apply to affected area 3 times daily  . [DISCONTINUED] testosterone cypionate (DEPOTESTOSTERONE CYPIONATE) 200 MG/ML injection Inject 200 mg into the muscle every 14 (fourteen) days.   No facility-administered encounter medications on file as of 03/31/2020.    Allergies (verified) Patient has no known allergies.   History: Past Medical History:  Diagnosis Date  . Arthritis    hands  .  Corticobasal syndrome ([place])   . Hyperlipidemia   . Hypertension   . Impingement syndrome of shoulder   . Parkinson disease ([place])   . Postoperative hypothyroidism   . Thyroid cancer ([place])   . Thyroid cancer [facility])    Past Surgical History:    Procedure Laterality Date  . APPENDECTOMY    . CATARACT EXTRACTION Bilateral   . COLONOSCOPY  01/24/2012   cleared for 5 yrs- [facility]  . COLONOSCOPY WITH PROPOFOL N/A 07/15/2017   Procedure: COLONOSCOPY WITH PROPOFOL;  Surgeon: Lucilla Lame, MD;  Location: [place];  Service: Endoscopy;  Laterality: N/A;  . POLYPECTOMY  07/15/2017   Procedure: POLYPECTOMY INTESTINAL;  Surgeon: Lucilla Lame, MD;  Location: [facility];  Service: Endoscopy;;  Ascending colon polyp Sigmoid colon polyp x 2 Rectal polyp x 2  . RETINAL DETACHMENT SURGERY Bilateral    Family History  Problem Relation Age of Onset  . Dementia Mother   . Stroke Father    Social History   Socioeconomic History  . Marital status: Married    Spouse name: Not on file  . Number of children: 2  . Years of education: Not on file  . Highest education level: Bachelor's degree (e.g., BA, AB, BS)  Occupational History  . Occupation: Retired  Tobacco Use  . Smoking status: Former Smoker    Packs/day: 1.50    Years: 40.00    Pack years: 60.00    Types: Cigarettes    Quit date: 07/2015    Years since quitting: 4.6  . Smokeless tobacco: Never Used  . Tobacco comment: smoking cessation materials not required  Vaping Use  . Vaping Use: Never used  Substance and Sexual Activity  . Alcohol use: Not Currently    Alcohol/week: 14.0 standard drinks    Types: 14 Cans of beer per week  . Drug use: No  . Sexual activity: Not Currently    Birth control/protection: None  Other Topics Concern  . Not on file  Social History Narrative  . Not on file   Social Determinants of Health   Financial Resource Strain: Low Risk   . Difficulty of Paying Living Expenses: Not hard at all  Food Insecurity: No Food Insecurity  . Worried About Charity fundraiser in the Last Year: Never true  . Ran Out of Food in the Last Year: Never true  Transportation Needs: No Transportation Needs  . Lack of Transportation (Medical): No   . Lack of Transportation (Non-Medical): No  Physical Activity: Inactive  . Days of Exercise per Week: 0 days  . Minutes of Exercise per Session: 0 min  Stress: No Stress Concern Present  . Feeling of Stress : Only a little  Social Connections: Moderately Isolated  . Frequency of Communication with Friends and Family: More than three times a week  . Frequency of Social Gatherings with Friends and Family: Once a week  . Attends Religious Services: Never  . Active Member of Clubs or Organizations: No  . Attends Archivist Meetings: Never  . Marital Status: Married    Tobacco Counseling Counseling given: Not Answered Comment: smoking cessation materials not required   Clinical Intake:  Pre-visit preparation completed: Yes  Pain : No/denies pain     Nutritional Risks: None Diabetes: No  How often do you need to have someone help you when you read instructions, pamphlets, or other written materials from your doctor or pharmacy?: 1 - Never    Interpreter Needed?:  No  Information entered by :: [name] LPN   Activities of Daily Living In your present state of health, do you have any difficulty performing the following activities: 03/31/2020  Hearing? N  Comment declines hearing aids  Vision? N  Difficulty concentrating or making decisions? Y  Comment difficulty concentrating  Walking or climbing stairs? Y  Dressing or bathing? Y  Doing errands, shopping? Y  Preparing Food and eating ? Y  Using the Toilet? Y  In the past six months, have you accidently leaked urine? N  Do you have problems with loss of bowel control? N  Managing your Medications? Y  Managing your Finances? Y  Housekeeping or managing your Housekeeping? Y  Some recent data might be hidden    Patient Care Team: Juline Patch, MD as PCP - General (Family Medicine) Sharolyn Douglas, MD as Consulting Physician (Neurology) Leim Fabry, MD as Consulting Physician (Orthopedic  Surgery)  Indicate any recent Medical Services you may have received from other than [facility] providers in the past year (date may be approximate).     Assessment:   This is a routine wellness examination for Ly[place].  Hearing/Vision screen  Hearing Screening   125Hz  250Hz  500Hz  1000Hz  2000Hz  3000Hz  4000Hz  6000Hz  8000Hz   Right ear:           Left ear:           Comments: Pt denies hearing difficulty  Vision Screening Comments: Annual vision screenings with Dr. Florinda Marker  Dietary issues and exercise activities discussed: Current Exercise Habits: The patient does not participate in regular exercise at present, Exercise limited by: neurologic condition(s)  Goals    . DIET - INCREASE WATER INTAKE     Recommend to drink at least 6-8 8oz glasses of water per day.      Depression Screen PHQ 2/9 Scores 03/31/2020 10/01/2019 08/31/2019 07/16/2019 04/02/2019 03/28/2019 10/17/2018  PHQ - 2 Score 0 0 0 0 0 0 0  PHQ- 9 Score - 2 4 7  0 - 6    Fall Risk Fall Risk  03/31/2020 10/01/2019 07/16/2019 04/02/2019 03/28/2019  Falls in the past year? 1 1 1  - 1  Number falls in past yr: 1 1 1 1 1   Injury with Fall? 1 1 0 0 1  Comment - - - - -  Risk Factor Category  - - - - -  Comment - - - - -  Risk for fall due to : Impaired balance/gait;Impaired mobility;History of fall(s) - History of fall(s) Impaired balance/gait Impaired mobility;Impaired balance/gait  Risk for fall due to: Comment - - - - -  Follow up Falls prevention discussed Falls evaluation completed Falls evaluation completed Falls evaluation completed Falls prevention discussed    FALL RISK PREVENTION PERTAINING TO THE HOME:  Any stairs in or around the home? Yes  - pt only uses ramp and master bedroom downstairs If so, are there any without handrails? No  Home free of loose throw rugs in walkways, pet beds, electrical cords, etc? Yes  Adequate lighting in your home to reduce risk of falls? Yes   ASSISTIVE DEVICES UTILIZED TO PREVENT  FALLS:  Life alert? No  Use of a cane, walker or w/c? Yes  Grab bars in the bathroom? Yes  Shower chair or bench in shower? Yes  Elevated toilet seat or a handicapped toilet? Yes   TIMED UP AND GO:  Was the test performed? No . Telephonic visit.   Cognitive Function: Patient has current diagnosis of  cognitive impairment. Patient is followed by neurology for ongoing assessment.       6CIT Screen 03/28/2019 07/04/2017  What Year? 0 points 0 points  What month? 0 points 0 points  What time? 0 points 0 points  Count back from 20 0 points 0 points  Months in reverse 4 points 0 points  Repeat phrase 0 points 2 points  Total Score 4 2    Immunizations Immunization History  Administered Date(s) Administered  . Fluad Quad(high Dose 65+) 01/17/2019  . Influenza, High Dose Seasonal PF 01/18/2017, 02/14/2018, 01/30/2020  . Influenza,inj,Quad PF,6+ Mos 12/31/2014  . Influenza-Unspecified 01/17/2017, 01/17/2019  . Moderna SARS-COVID-2 Vaccination 06/20/2019, 07/18/2019, 03/13/2020  . Pneumococcal Conjugate-13 01/27/2016  . Pneumococcal Polysaccharide-23 02/25/2017  . Tdap 03/08/2016    TDAP status: Up to date  Flu Vaccine status: Up to date  Pneumococcal vaccine status: Up to date  Covid-19 vaccine status: Completed vaccines  Qualifies for Shingles Vaccine? Yes   Zostavax completed Yes   Shingrix Completed?: No.    Education has been provided regarding the importance of this vaccine. Patient has been advised to call insurance company to determine out of pocket expense if they have not yet received this vaccine. Advised may also receive vaccine at local pharmacy or Health Dept. Verbalized acceptance and understanding.  Screening Tests Health Maintenance  Topic Date Due  . COLONOSCOPY  07/16/2022  . TETANUS/TDAP  03/08/2026  . INFLUENZA VACCINE  Completed  . COVID-19 Vaccine  Completed  . Hepatitis C Screening  Completed  . PNA vac Low Risk Adult  Completed    Health  Maintenance  There are no preventive care reminders to display for this patient.  Colorectal cancer screening: Type of screening: Colonoscopy. Completed 07/15/17. Repeat every 5 years  Lung Cancer Screening: (Low Dose CT Chest recommended if Age 55-80 years, 30 pack-year currently smoking OR have quit w/in 15years.) does not qualify.   Additional Screening:  Hepatitis C Screening: does qualify; Completed 09/01/16  Vision Screening: Recommended annual ophthalmology exams for early detection of glaucoma and other disorders of the eye. Is the patient up to date with their annual eye exam?  Yes  Who is the provider or what is the name of the office in which the patient attends annual eye exams? Dr. Florinda Marker   Dental Screening: Recommended annual dental exams for proper oral hygiene  Community Resource Referral / Chronic Care Management: CRR required this visit?  No   CCM required this visit?  No      Plan:     I have personally reviewed and noted the following in the patient's chart:   . Medical and social history . Use of alcohol, tobacco or illicit drugs  . Current medications and supplements . Functional ability and status . Nutritional status . Physical activity . Advanced directives . List of other physicians . Hospitalizations, surgeries, and ER visits in previous 12 months . Vitals . Screenings to include cognitive, depression, and falls . Referrals and appointments  In addition, I have reviewed and discussed with patient certain preventive protocols, quality metrics, and best practice recommendations. A written personalized care plan for preventive services as well as general preventive health recommendations were provided to patient.     [name], LPN   02/01/3234   Nurse Notes: pt's wife Susie states they have not received pt's new wheelchair ordered by Dr. Tillman Sers but plans to follow up with [facility] supply

## 2020-04-01 ENCOUNTER — Ambulatory Visit: Payer: Medicare Other | Admitting: Family Medicine

## 2020-04-07 ENCOUNTER — Ambulatory Visit: Payer: Medicare Other | Admitting: Family Medicine

## 2020-04-10 ENCOUNTER — Ambulatory Visit (INDEPENDENT_AMBULATORY_CARE_PROVIDER_SITE_OTHER): Payer: Medicare Other | Admitting: Family Medicine

## 2020-04-10 ENCOUNTER — Other Ambulatory Visit: Payer: Self-pay

## 2020-04-10 ENCOUNTER — Encounter: Payer: Self-pay | Admitting: Family Medicine

## 2020-04-10 VITALS — BP 130/60 | HR 64 | Ht 67.0 in | Wt 148.0 lb

## 2020-04-10 DIAGNOSIS — F330 Major depressive disorder, recurrent, mild: Secondary | ICD-10-CM | POA: Diagnosis not present

## 2020-04-10 DIAGNOSIS — I10 Essential (primary) hypertension: Secondary | ICD-10-CM

## 2020-04-10 DIAGNOSIS — E785 Hyperlipidemia, unspecified: Secondary | ICD-10-CM

## 2020-04-10 MED ORDER — LOSARTAN POTASSIUM-HCTZ 50-12.5 MG PO TABS: 1.0000 | ORAL_TABLET | Freq: Every day | ORAL | 1 refills | Status: DC

## 2020-04-10 MED ORDER — ATORVASTATIN CALCIUM 10 MG PO TABS: 10.0000 mg | ORAL_TABLET | Freq: Every day | ORAL | 1 refills | Status: DC

## 2020-04-10 MED ORDER — METOPROLOL SUCCINATE ER 100 MG PO TB24: ORAL_TABLET | ORAL | 1 refills | Status: DC

## 2020-04-10 MED ORDER — SERTRALINE HCL 50 MG PO TABS: 50.0000 mg | ORAL_TABLET | Freq: Every day | ORAL | 1 refills | Status: DC

## 2020-04-10 NOTE — Progress Notes (Signed)
Date:  04/10/2020   Name:  Travis Robertson   DOB:  01-21-1951   MRN:  [id number]   Chief Complaint: Hyperlipidemia, Hypertension, and Depression  Hyperlipidemia This is a chronic problem. The current episode started more than 1 year ago. The problem is controlled. Recent lipid tests were reviewed and are normal. He has no history of chronic renal disease, diabetes, liver disease, obesity or nephrotic syndrome. There are no known factors aggravating his hyperlipidemia. Pertinent negatives include no chest pain, focal sensory loss, focal weakness, leg pain, myalgias or shortness of breath. Current antihyperlipidemic treatment includes statins. The current treatment provides moderate improvement of lipids. There are no compliance problems.   Hypertension This is a chronic problem. The current episode started more than 1 year ago. The problem has been gradually improving since onset. Pertinent negatives include no anxiety, blurred vision, chest pain, headaches, malaise/fatigue, neck pain, orthopnea, palpitations, peripheral edema, PND, shortness of breath or sweats. There are no associated agents to hypertension. Risk factors for coronary artery disease include dyslipidemia. Past treatments include beta blockers, angiotensin blockers and diuretics. The current treatment provides moderate improvement. There are no compliance problems.  There is no history of angina, kidney disease, CAD/MI, CVA, heart failure, left ventricular hypertrophy, PVD or retinopathy. There is no history of chronic renal disease, a hypertension causing med or renovascular disease.  Depression        This is a chronic problem.  The current episode started more than 1 year ago.   The onset quality is gradual.   The problem has been waxing and waning since onset.  Associated symptoms include no decreased concentration, no fatigue, no helplessness, no hopelessness, does not have insomnia, not irritable, no restlessness, no  decreased interest, no appetite change, no body aches, no myalgias, no headaches, no indigestion, not sad and no suicidal ideas.     The symptoms are aggravated by nothing.  Past treatments include SSRIs - Selective serotonin reuptake inhibitors.  Compliance with treatment is good.  Past compliance problems include difficulty understanding directions.  Previous treatment provided moderate relief.   Pertinent negatives include no anxiety.   Lab Results  Component Value Date   CREATININE 1.13 07/24/2019   BUN 15 07/24/2019   NA 128 (L) 07/24/2019   K 3.9 07/24/2019   CL 90 (L) 07/24/2019   CO2 27 07/24/2019   Lab Results  Component Value Date   CHOL 163 10/17/2018   HDL 53 10/17/2018   LDLCALC 93 10/17/2018   TRIG 87 10/17/2018   CHOLHDL 2.8 09/01/2016   No results found for: TSH No results found for: HGBA1C Lab Results  Component Value Date   WBC 7.9 07/24/2019   HGB 15.4 07/24/2019   HCT 42.8 07/24/2019   MCV 94.9 07/24/2019   PLT 254 07/24/2019   Lab Results  Component Value Date   ALT 4 10/17/2018   AST 13 10/17/2018   ALKPHOS 110 10/17/2018   BILITOT 0.5 10/17/2018     Review of Systems  Constitutional: Negative for appetite change, chills, fatigue, fever and malaise/fatigue.  HENT: Negative for drooling, ear discharge, ear pain and sore throat.   Eyes: Negative for blurred vision.  Respiratory: Negative for cough, shortness of breath and wheezing.   Cardiovascular: Negative for chest pain, palpitations, orthopnea, leg swelling and PND.  Gastrointestinal: Negative for abdominal pain, blood in stool, constipation, diarrhea and nausea.  Endocrine: Negative for polydipsia.  Genitourinary: Negative for dysuria, frequency, hematuria and urgency.  Musculoskeletal: Negative  for back pain, myalgias and neck pain.  Skin: Negative for rash.  Allergic/Immunologic: Negative for environmental allergies.  Neurological: Negative for dizziness, focal weakness and headaches.   Hematological: Does not bruise/bleed easily.  Psychiatric/Behavioral: Positive for depression. Negative for decreased concentration and suicidal ideas. The patient is not nervous/anxious and does not have insomnia.     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 05/20/2017   Corticobasal syndrome ([place]) 04/21/2017   Parkinson disease ([place]) 04/21/2017   Loss of memory 12/23/2016   Rapidly progressive weakness 10/28/2016   Ataxia 10/12/2016   B12 deficiency 10/12/2016   Dizziness 10/12/2016   Essential hypertension 12/31/2014   Hyperlipidemia 12/31/2014    No Known Allergies  Past Surgical History:  Procedure Laterality Date   APPENDECTOMY     CATARACT EXTRACTION Bilateral    COLONOSCOPY  01/24/2012   cleared for 5 yrs- [facility]   COLONOSCOPY WITH PROPOFOL N/A 07/15/2017   Procedure: COLONOSCOPY WITH PROPOFOL;  Surgeon: Lucilla Lame, MD;  Location: [place];  Service: Endoscopy;  Laterality: N/A;   POLYPECTOMY  07/15/2017   Procedure: POLYPECTOMY INTESTINAL;  Surgeon: Lucilla Lame, MD;  Location: [place];  Service: Endoscopy;;  Ascending colon polyp Sigmoid colon polyp x 2 Rectal polyp x 2   RETINAL DETACHMENT SURGERY Bilateral     Social History   Tobacco Use   Smoking status: Former Smoker    Packs/day: 1.50    Years: 40.00    Pack years: 60.00    Types: Cigarettes    Quit date: 07/2015    Years  since quitting: 4.7   Smokeless tobacco: Never Used   Tobacco comment: smoking cessation materials not required  Vaping Use   Vaping Use: Never used  Substance Use Topics   Alcohol use: Not Currently    Alcohol/week: 14.0 standard drinks    Types: 14 Cans of beer per week   Drug use: No     Medication list has been reviewed and updated.  Current Meds  Medication Sig   aspirin 81 MG tablet Take 81 mg by mouth daily.   atorvastatin (LIPITOR) 10 MG tablet TAKE 1 TABLET BY MOUTH EVERY DAY   Calcium Carb-Cholecalciferol 600-800 MG-UNIT TABS    carbidopa-levodopa (SINEMET IR) 25-100 MG tablet Take 3 tablets by mouth 3 (three) times daily.   clonazePAM (KLONOPIN) 0.5 MG tablet Take 1 tablet by mouth at bedtime. neuro   Infant Care Products (DERMACLOUD) CREA Apply topically daily as needed.   levothyroxine (SYNTHROID, LEVOTHROID) 100 MCG tablet Take 100 mcg by mouth daily. Dr [name]   losartan-hydrochlorothiazide [facility]) 50-12.5 MG tablet Take 1 tablet by mouth daily.   metoprolol succinate (TOPROL-XL) 100 MG 24 hr tablet Take with or immediately following a meal.   Polyethylene Glycol 3350 (PEG 3350) 17 GM/SCOOP POWD Take by mouth. PRN only   QUEtiapine (SEROQUEL) 25 MG tablet Take 25 mg by mouth at bedtime.   sertraline (ZOLOFT) 50 MG tablet TAKE  1 TABLET BY MOUTH EVERY DAY   testosterone cypionate (DEPOTESTOSTERONE CYPIONATE) 200 MG/ML injection Inject into the muscle. Inject 0.5 mls (100 mg) every 14 days   Vitamin D, Ergocalciferol, (DRISDOL) 1.25 MG (50000 UT) CAPS capsule Take 50,000 Units by mouth once a week.    PHQ 2/9 Scores 04/10/2020 03/31/2020 10/01/2019 08/31/2019  PHQ - 2 Score 0 0 0 0  PHQ- 9 Score 2 - 2 4    GAD 7 : Generalized Anxiety Score 04/10/2020 10/01/2019 08/31/2019 07/16/2019  Nervous, Anxious, on Edge 0 2 0 0  Control/stop worrying 0 1 0 0  Worry too much - different things 1 1 0 0  Trouble relaxing 0 0 0 0  Restless 0 0 0 0  Easily annoyed  or irritable 2 2 0 2  Afraid - awful might happen 0 0 0 0  Total GAD 7 Score 3 6 0 2  Anxiety Difficulty Not difficult at all Somewhat difficult - Not difficult at all    BP Readings from Last 3 Encounters:  04/10/20 130/60  10/26/19 107/68  10/01/19 110/80    Physical Exam Vitals and nursing note reviewed.  Constitutional:      General: He is not irritable. HENT:     Head: Normocephalic.     Right Ear: Tympanic membrane, ear canal and external ear normal. There is no impacted cerumen.     Left Ear: Tympanic membrane, ear canal and external ear normal. There is no impacted cerumen.     Nose: Nose normal. No congestion or rhinorrhea.     Mouth/Throat:     Mouth: Oropharynx is clear and moist. Mucous membranes are moist.  Eyes:     General: No scleral icterus.       Right eye: No discharge.        Left eye: No discharge.     Extraocular Movements: EOM normal.     Conjunctiva/sclera: Conjunctivae normal.     Pupils: Pupils are equal, round, and reactive to light.  Neck:     Thyroid: No thyromegaly.     Vascular: No JVD.     Trachea: No tracheal deviation.  Cardiovascular:     Rate and Rhythm: Normal rate and regular rhythm.     Pulses: Intact distal pulses.     Heart sounds: Normal heart sounds. No murmur heard. No friction rub. No gallop.   Pulmonary:     Effort: No respiratory distress.     Breath sounds: Normal breath sounds. No wheezing, rhonchi or rales.  Abdominal:     General: Bowel sounds are normal.     Palpations: Abdomen is soft. There is no hepatosplenomegaly or mass.     Tenderness: There is no abdominal tenderness. There is no CVA tenderness, guarding or rebound.  Musculoskeletal:        General: No tenderness or edema. Normal range of motion.     Cervical back: Normal range of motion and neck supple.  Lymphadenopathy:     Cervical: No cervical adenopathy.  Skin:    General: Skin is warm.     Findings: No rash.  Neurological:     Mental Status: He is  alert and oriented to person, place, and time.     Cranial Nerves: No cranial nerve deficit.     Deep Tendon Reflexes: Strength normal and reflexes are normal and symmetric.     Wt Readings from Last 3 Encounters:  04/10/20 148 lb (67.1 kg)  10/26/19 158 lb (71.7 kg)  10/01/19  158 lb (71.7 kg)    BP 130/60    Pulse 64    Ht 5\' 7"  (1.702 m)    Wt 148 lb (67.1 kg)    BMI 23.18 kg/m   Assessment and Plan: 1. Essential hypertension Chronic.  Controlled.  Stable.  Blood pressure today is 130/60.  Continue losartan hydrochlorothiazide 50-12.5 mg and metoprolol XL 100 mg once a day. - losartan-hydrochlorothiazide (HYZAAR) 50-12.5 MG tablet; Take 1 tablet by mouth daily.  Dispense: 90 tablet; Refill: 1 - metoprolol succinate (TOPROL-XL) 100 MG 24 hr tablet; Take with or immediately following a meal.  Dispense: 90 tablet; Refill: 1  2. Hyperlipidemia, unspecified hyperlipidemia type Chronic.  Controlled.  Stable.  Continue atorvastatin 10 mg once a day. - atorvastatin (LIPITOR) 10 MG tablet; Take 1 tablet (10 mg total) by mouth daily.  Dispense: 90 tablet; Refill: 1  3. Mild episode of recurrent major depressive disorder (HCC) Chronic.  Controlled.  Stable.  PHQ 4.  Continue sertraline 50 mg 1 a day.  We will recheck patient in 6 months. - sertraline (ZOLOFT) 50 MG tablet; Take 1 tablet (50 mg total) by mouth daily.  Dispense: 90 tablet; Refill: 1

## 2020-04-21 DIAGNOSIS — C73 Malignant neoplasm of thyroid gland: Secondary | ICD-10-CM | POA: Diagnosis not present

## 2020-04-21 DIAGNOSIS — E890 Postprocedural hypothyroidism: Secondary | ICD-10-CM | POA: Diagnosis not present

## 2020-04-21 DIAGNOSIS — E230 Hypopituitarism: Secondary | ICD-10-CM | POA: Diagnosis not present

## 2020-04-21 DIAGNOSIS — Z79899 Other long term (current) drug therapy: Secondary | ICD-10-CM | POA: Diagnosis not present

## 2020-04-21 DIAGNOSIS — R739 Hyperglycemia, unspecified: Secondary | ICD-10-CM | POA: Diagnosis not present

## 2020-04-21 DIAGNOSIS — E782 Mixed hyperlipidemia: Secondary | ICD-10-CM | POA: Diagnosis not present

## 2020-04-21 DIAGNOSIS — E559 Vitamin D deficiency, unspecified: Secondary | ICD-10-CM | POA: Diagnosis not present

## 2020-04-24 ENCOUNTER — Encounter: Payer: Self-pay | Admitting: Family Medicine

## 2020-04-28 DIAGNOSIS — E230 Hypopituitarism: Secondary | ICD-10-CM | POA: Diagnosis not present

## 2020-06-23 DIAGNOSIS — H338 Other retinal detachments: Secondary | ICD-10-CM | POA: Diagnosis not present

## 2020-06-23 DIAGNOSIS — H04123 Dry eye syndrome of bilateral lacrimal glands: Secondary | ICD-10-CM | POA: Diagnosis not present

## 2020-06-23 DIAGNOSIS — Z961 Presence of intraocular lens: Secondary | ICD-10-CM | POA: Diagnosis not present

## 2020-07-17 DIAGNOSIS — G3185 Corticobasal degeneration: Secondary | ICD-10-CM | POA: Diagnosis not present

## 2020-07-17 DIAGNOSIS — R5382 Chronic fatigue, unspecified: Secondary | ICD-10-CM | POA: Diagnosis not present

## 2020-08-25 DIAGNOSIS — I739 Peripheral vascular disease, unspecified: Secondary | ICD-10-CM | POA: Diagnosis not present

## 2020-08-25 DIAGNOSIS — M79674 Pain in right toe(s): Secondary | ICD-10-CM | POA: Diagnosis not present

## 2020-08-25 DIAGNOSIS — M79675 Pain in left toe(s): Secondary | ICD-10-CM | POA: Diagnosis not present

## 2020-08-25 DIAGNOSIS — G20 Parkinson's disease: Secondary | ICD-10-CM | POA: Diagnosis not present

## 2020-08-25 DIAGNOSIS — B351 Tinea unguium: Secondary | ICD-10-CM | POA: Diagnosis not present

## 2020-09-08 ENCOUNTER — Encounter: Payer: Self-pay | Admitting: Family Medicine

## 2020-09-08 ENCOUNTER — Ambulatory Visit (INDEPENDENT_AMBULATORY_CARE_PROVIDER_SITE_OTHER): Payer: Medicare Other | Admitting: Family Medicine

## 2020-09-08 ENCOUNTER — Other Ambulatory Visit: Payer: Self-pay

## 2020-09-08 VITALS — BP 100/62 | HR 94 | Temp 98.2°F | Ht 67.0 in | Wt 148.0 lb

## 2020-09-08 DIAGNOSIS — I9589 Other hypotension: Secondary | ICD-10-CM

## 2020-09-08 DIAGNOSIS — Z20822 Contact with and (suspected) exposure to covid-19: Secondary | ICD-10-CM | POA: Diagnosis not present

## 2020-09-08 DIAGNOSIS — E861 Hypovolemia: Secondary | ICD-10-CM

## 2020-09-08 DIAGNOSIS — R400 Somnolence: Secondary | ICD-10-CM | POA: Diagnosis not present

## 2020-09-08 DIAGNOSIS — E860 Dehydration: Secondary | ICD-10-CM

## 2020-09-08 DIAGNOSIS — M6281 Muscle weakness (generalized): Secondary | ICD-10-CM | POA: Diagnosis not present

## 2020-09-08 DIAGNOSIS — F0280 Dementia in other diseases classified elsewhere without behavioral disturbance: Secondary | ICD-10-CM | POA: Diagnosis not present

## 2020-09-08 DIAGNOSIS — I951 Orthostatic hypotension: Secondary | ICD-10-CM | POA: Diagnosis not present

## 2020-09-08 DIAGNOSIS — Z79890 Hormone replacement therapy (postmenopausal): Secondary | ICD-10-CM | POA: Diagnosis not present

## 2020-09-08 DIAGNOSIS — R4182 Altered mental status, unspecified: Secondary | ICD-10-CM

## 2020-09-08 DIAGNOSIS — I10 Essential (primary) hypertension: Secondary | ICD-10-CM | POA: Diagnosis not present

## 2020-09-08 DIAGNOSIS — N179 Acute kidney failure, unspecified: Secondary | ICD-10-CM | POA: Diagnosis not present

## 2020-09-08 DIAGNOSIS — I959 Hypotension, unspecified: Secondary | ICD-10-CM | POA: Diagnosis not present

## 2020-09-08 DIAGNOSIS — G20 Parkinson's disease: Secondary | ICD-10-CM | POA: Diagnosis not present

## 2020-09-08 DIAGNOSIS — R42 Dizziness and giddiness: Secondary | ICD-10-CM | POA: Diagnosis not present

## 2020-09-08 DIAGNOSIS — Z87891 Personal history of nicotine dependence: Secondary | ICD-10-CM | POA: Diagnosis not present

## 2020-09-08 DIAGNOSIS — D72825 Bandemia: Secondary | ICD-10-CM | POA: Diagnosis not present

## 2020-09-08 DIAGNOSIS — E785 Hyperlipidemia, unspecified: Secondary | ICD-10-CM | POA: Diagnosis not present

## 2020-09-08 DIAGNOSIS — N178 Other acute kidney failure: Secondary | ICD-10-CM | POA: Diagnosis not present

## 2020-09-08 DIAGNOSIS — G300 Alzheimer's disease with early onset: Secondary | ICD-10-CM | POA: Diagnosis not present

## 2020-09-08 DIAGNOSIS — M1990 Unspecified osteoarthritis, unspecified site: Secondary | ICD-10-CM | POA: Diagnosis not present

## 2020-09-08 DIAGNOSIS — Z79899 Other long term (current) drug therapy: Secondary | ICD-10-CM | POA: Diagnosis not present

## 2020-09-08 NOTE — Progress Notes (Signed)
Date:  09/08/2020   Name:  Travis Robertson   DOB:  June 10, 1950   MRN:  [id number]   Chief Complaint: change in speech  Neurologic Problem The patient's primary symptoms include an altered mental status, clumsiness, a loss of balance, slurred speech and weakness. The patient's pertinent negatives include no focal sensory loss, focal weakness, near-syncope or syncope. This is a chronic (abrupt change) problem. The current episode started yesterday. The neurological problem developed suddenly (for midmorning yesterday). The problem has been rapidly worsening since onset. Associated symptoms include confusion. Pertinent negatives include no abdominal pain, back pain, bladder incontinence, bowel incontinence, chest pain, diaphoresis, dizziness, fatigue, fever, headaches, nausea, neck pain, palpitations or shortness of breath. (Ms)    Lab Results  Component Value Date   CREATININE 1.13 07/24/2019   BUN 15 07/24/2019   NA 128 (L) 07/24/2019   K 3.9 07/24/2019   CL 90 (L) 07/24/2019   CO2 27 07/24/2019   Lab Results  Component Value Date   CHOL 163 10/17/2018   HDL 53 10/17/2018   LDLCALC 93 10/17/2018   TRIG 87 10/17/2018   CHOLHDL 2.8 09/01/2016   No results found for: TSH No results found for: HGBA1C Lab Results  Component Value Date   WBC 7.9 07/24/2019   HGB 15.4 07/24/2019   HCT 42.8 07/24/2019   MCV 94.9 07/24/2019   PLT 254 07/24/2019   Lab Results  Component Value Date   ALT 4 10/17/2018   AST 13 10/17/2018   ALKPHOS 110 10/17/2018   BILITOT 0.5 10/17/2018     Review of Systems  Constitutional: Negative for chills, diaphoresis, fatigue and fever.  HENT: Negative for drooling, ear discharge, ear pain and sore throat.   Respiratory: Negative for cough, shortness of breath and wheezing.   Cardiovascular: Negative for chest pain, palpitations, leg swelling and near-syncope.  Gastrointestinal: Negative for abdominal pain, blood in stool, bowel incontinence,  constipation, diarrhea and nausea.  Endocrine: Negative for polydipsia.  Genitourinary: Negative for bladder incontinence, dysuria, frequency, hematuria and urgency.  Musculoskeletal: Negative for back pain, myalgias and neck pain.  Skin: Negative for rash.  Allergic/Immunologic: Negative for environmental allergies.  Neurological: Positive for speech difficulty, weakness and loss of balance. Negative for dizziness, focal weakness, syncope, facial asymmetry and headaches.  Hematological: Does not bruise/bleed easily.  Psychiatric/Behavioral: Positive for confusion. Negative for suicidal ideas. The patient is not nervous/anxious.        Somnolent    Patient Active Problem List   Diagnosis Date Noted  . AAA (abdominal aortic aneurysm) without rupture ([place]) 10/26/2019  . Aortic atherosclerosis ([place]) 10/17/2018  . Postoperative hypothyroidism 04/24/2018  . Depression 11/11/2017  . Thyroid cancer ([place]) 08/24/2017  . Displaced fracture of acromial process 08/16/2017  . Personal history of colonic polyps   . Benign neoplasm of descending colon   . Polyp of sigmoid colon   . Rectal polyp   . Mild episode of recurrent major depressive disorder ([place]) 07/14/2017  . 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 Corticobasal syndrome ([place]) 04/21/2017  . Parkinson disease ([place]) 04/21/2017  . Loss of memory 12/23/2016  . Rapidly progressive weakness 10/28/2016  . Ataxia 10/12/2016  . B12 deficiency 10/12/2016  . Dizziness 10/12/2016  . Essential hypertension 12/31/2014  . Hyperlipidemia 12/31/2014    No Known Allergies  Past Surgical History:  Procedure Laterality Date  . APPENDECTOMY    . CATARACT EXTRACTION Bilateral   . COLONOSCOPY  01/24/2012  cleared for 5 yrs- [facility]  . COLONOSCOPY WITH PROPOFOL N/A 07/15/2017   Procedure: COLONOSCOPY WITH PROPOFOL;  Surgeon: Lucilla Lame, MD;  Location: [place];  Service: Endoscopy;  Laterality: N/A;   . POLYPECTOMY  07/15/2017   Procedure: POLYPECTOMY INTESTINAL;  Surgeon: Lucilla Lame, MD;  Location: [place];  Service: Endoscopy;;  Ascending colon polyp Sigmoid colon polyp x 2 Rectal polyp x 2  . RETINAL DETACHMENT SURGERY Bilateral     Social History   Tobacco Use  . Smoking status: Former Smoker    Packs/day: 1.50    Years: 40.00    Pack years: 60.00    Types: Cigarettes    Quit date: 07/2015    Years since quitting: 5.1  . Smokeless tobacco: Never Used  . Tobacco comment: smoking cessation materials not required  Vaping Use  . Vaping Use: Never used  Substance Use Topics  . Alcohol use: Not Currently    Alcohol/week: 14.0 standard drinks    Types: 14 Cans of beer per week  . Drug use: No     Medication list has been reviewed and updated.  Current Meds  Medication Sig  . aspirin 81 MG tablet Take 81 mg by mouth daily.  Marland Kitchen atorvastatin (LIPITOR) 10 MG tablet Take 1 tablet (10 mg total) by mouth daily.  . Calcium Carb-Cholecalciferol 600-800 MG-UNIT TABS   . carbidopa-levodopa (SINEMET IR) 25-100 MG tablet Take 3 tablets by mouth 3 (three) times daily.  . clonazePAM (KLONOPIN) 0.5 MG tablet Take 1 tablet by mouth at bedtime. neuro  . Infant Care Products (DERMACLOUD) CREA Apply topically daily as needed.  Marland Kitchen levothyroxine (SYNTHROID, LEVOTHROID) 100 MCG tablet Take 100 mcg by mouth daily. Dr [name]  . losartan-hydrochlorothiazide (HYZAAR) 50-12.5 MG tablet Take 1 tablet by mouth daily.  . metoprolol succinate (TOPROL-XL) 100 MG 24 hr tablet Take with or immediately following a meal.  . Polyethylene Glycol 3350 (PEG 3350) 17 GM/SCOOP POWD Take by mouth. PRN only  . QUEtiapine (SEROQUEL) 25 MG tablet Take 25 mg by mouth at bedtime.  . sertraline (ZOLOFT) 50 MG tablet Take 1 tablet (50 mg total) by mouth daily.  Marland Kitchen testosterone cypionate (DEPOTESTOSTERONE CYPIONATE) 200 MG/ML injection Inject into the muscle. Inject 0.5 mls (100 mg) every 14 days  . Vitamin  D, Ergocalciferol, (DRISDOL) 1.25 MG (50000 UT) CAPS capsule Take 50,000 Units by mouth once a week.    PHQ 2/9 Scores 09/08/2020 04/10/2020 03/31/2020 10/01/2019  PHQ - 2 Score - 0 0 0  PHQ- 9 Score - 2 - 2  Exception Documentation Other- indicate reason in comment box - - -  Not completed having trouble with speech and mental change right now - - -    GAD 7 : Generalized Anxiety Score 04/10/2020 10/01/2019 08/31/2019 07/16/2019  Nervous, Anxious, on Edge 0 2 0 0  Control/stop worrying 0 1 0 0  Worry too much - different things 1 1 0 0  Trouble relaxing 0 0 0 0  Restless 0 0 0 0  Easily annoyed or irritable 2 2 0 2  Afraid - awful might happen 0 0 0 0  Total GAD 7 Score 3 6 0 2  Anxiety Difficulty Not difficult at all Somewhat difficult - Not difficult at all    BP Readings from Last 3 Encounters:  09/08/20 100/62  04/10/20 130/60  10/26/19 107/68    Physical Exam Vitals and nursing note reviewed.  Constitutional:      Appearance:  He is ill-appearing.  HENT:     Head: Normocephalic.     Right Ear: Tympanic membrane and external ear normal.     Left Ear: Tympanic membrane and external ear normal.     Nose: Nose normal.  Eyes:     General: No scleral icterus.       Right eye: No discharge.        Left eye: No discharge.     Conjunctiva/sclera: Conjunctivae normal.     Pupils: Pupils are equal, round, and reactive to light.  Neck:     Thyroid: No thyromegaly.     Vascular: No JVD.     Trachea: No tracheal deviation.  Cardiovascular:     Rate and Rhythm: Normal rate and regular rhythm.     Heart sounds: Normal heart sounds. No murmur heard. No friction rub. No gallop.   Pulmonary:     Effort: No respiratory distress.     Breath sounds: Normal breath sounds. No wheezing or rales.  Abdominal:     General: Bowel sounds are normal.     Palpations: Abdomen is soft. There is no mass.     Tenderness: There is no abdominal tenderness. There is no guarding or rebound.   Musculoskeletal:        General: No tenderness. Normal range of motion.     Cervical back: Normal range of motion and neck supple.  Lymphadenopathy:     Cervical: No cervical adenopathy.  Skin:    General: Skin is warm.     Capillary Refill: Capillary refill takes less than 2 seconds.     Coloration: Skin is ashen and pale.     Findings: No rash.  Neurological:     Mental Status: He is alert and oriented to person, place, and time.     Cranial Nerves: No cranial nerve deficit.     Deep Tendon Reflexes: Reflexes are normal and symmetric.     Wt Readings from Last 3 Encounters:  09/08/20 148 lb (67.1 kg)  04/10/20 148 lb (67.1 kg)  10/26/19 158 lb (71.7 kg)    BP 100/62   Pulse 94   Temp 98.2 F (36.8 C) (Oral)   Ht 5\' 7"  (1.702 m)   Wt 148 lb (67.1 kg)   SpO2 97%   BMI 23.18 kg/m   Assessment and Plan:    1. Somnolence Abrupt change in mental status to the point of somnolence.  Change in patient's communication unable to answer questions and has a decrease in overall awareness.  2. Altered mental status, unspecified altered mental status type Mental status as noted above which is abrupt which may signify that patient is either having sepsis or cerebrovascular concerns.  3. Generalized muscle weakness Overall muscle tone is weak and decreased tone with inability to even sit up which he was able to do within 24 hours ago.  4. Dehydration Oral moisture is decreased and there has been a decrease in both food and fluid intake over the past 24 hours.  5. Hypotension due to hypovolemia Patient is noted to be hypotensive at 100/62 which is an abrupt change from his usual pressure.  This may reflect that he is septic from a urological source or other source.  We will send to hospital facility with records being primarily at [id number] as well as his neurologist.

## 2020-09-09 DIAGNOSIS — N179 Acute kidney failure, unspecified: Secondary | ICD-10-CM | POA: Diagnosis not present

## 2020-09-09 DIAGNOSIS — F0280 Dementia in other diseases classified elsewhere without behavioral disturbance: Secondary | ICD-10-CM | POA: Diagnosis not present

## 2020-09-09 DIAGNOSIS — G239 Degenerative disease of basal ganglia, unspecified: Secondary | ICD-10-CM | POA: Diagnosis not present

## 2020-09-09 DIAGNOSIS — N178 Other acute kidney failure: Secondary | ICD-10-CM | POA: Diagnosis not present

## 2020-09-09 DIAGNOSIS — G20 Parkinson's disease: Secondary | ICD-10-CM | POA: Diagnosis not present

## 2020-09-09 DIAGNOSIS — I9589 Other hypotension: Secondary | ICD-10-CM | POA: Diagnosis not present

## 2020-09-09 DIAGNOSIS — E861 Hypovolemia: Secondary | ICD-10-CM | POA: Diagnosis not present

## 2020-09-09 DIAGNOSIS — R42 Dizziness and giddiness: Secondary | ICD-10-CM | POA: Diagnosis not present

## 2020-09-09 LAB — COMPREHENSIVE METABOLIC PANEL: Calcium: 8 — AB (ref 8.7–10.7)

## 2020-09-09 LAB — BASIC METABOLIC PANEL
BUN: 23 — AB (ref 4–21)
CO2: 22 (ref 13–22)
Chloride: 100 (ref 99–108)
Creatinine: 1.1 (ref 0.6–1.3)
Glucose: 110
Potassium: 3.8 (ref 3.4–5.3)
Sodium: 131 — AB (ref 137–147)

## 2020-09-10 ENCOUNTER — Telehealth: Payer: Self-pay

## 2020-09-10 NOTE — Telephone Encounter (Signed)
Will see on Friday the 20th- spoke to wife

## 2020-09-10 NOTE — Telephone Encounter (Signed)
Copied from [place] [id number]. Topic: General - Inquiry >> Sep 10, 2020  9:51 AM Virl Axe D wrote: Reason for CRM:Pt's wife stated she would like a callback from [place] regarding pt's recent hospital visit and next steps. Stated she has questions and would like return call.

## 2020-09-11 DIAGNOSIS — E861 Hypovolemia: Secondary | ICD-10-CM | POA: Diagnosis not present

## 2020-09-11 DIAGNOSIS — G3185 Corticobasal degeneration: Secondary | ICD-10-CM | POA: Diagnosis not present

## 2020-09-11 DIAGNOSIS — A084 Viral intestinal infection, unspecified: Secondary | ICD-10-CM | POA: Diagnosis not present

## 2020-09-11 DIAGNOSIS — R5382 Chronic fatigue, unspecified: Secondary | ICD-10-CM | POA: Diagnosis not present

## 2020-09-11 DIAGNOSIS — R2689 Other abnormalities of gait and mobility: Secondary | ICD-10-CM | POA: Diagnosis not present

## 2020-09-11 DIAGNOSIS — I951 Orthostatic hypotension: Secondary | ICD-10-CM | POA: Diagnosis not present

## 2020-09-12 ENCOUNTER — Encounter: Payer: Self-pay | Admitting: Family Medicine

## 2020-09-12 ENCOUNTER — Ambulatory Visit (INDEPENDENT_AMBULATORY_CARE_PROVIDER_SITE_OTHER): Payer: Medicare Other | Admitting: Family Medicine

## 2020-09-12 ENCOUNTER — Other Ambulatory Visit: Payer: Self-pay

## 2020-09-12 VITALS — BP 128/74 | HR 64 | Ht 67.0 in | Wt 148.0 lb

## 2020-09-12 DIAGNOSIS — N289 Disorder of kidney and ureter, unspecified: Secondary | ICD-10-CM

## 2020-09-12 DIAGNOSIS — R197 Diarrhea, unspecified: Secondary | ICD-10-CM | POA: Diagnosis not present

## 2020-09-12 DIAGNOSIS — E861 Hypovolemia: Secondary | ICD-10-CM

## 2020-09-12 DIAGNOSIS — D72825 Bandemia: Secondary | ICD-10-CM | POA: Diagnosis not present

## 2020-09-12 DIAGNOSIS — Z09 Encounter for follow-up examination after completed treatment for conditions other than malignant neoplasm: Secondary | ICD-10-CM

## 2020-09-12 DIAGNOSIS — I9589 Other hypotension: Secondary | ICD-10-CM | POA: Diagnosis not present

## 2020-09-12 DIAGNOSIS — E860 Dehydration: Secondary | ICD-10-CM | POA: Diagnosis not present

## 2020-09-12 NOTE — Progress Notes (Signed)
Date:  09/12/2020   Name:  Travis Robertson   DOB:  October 24, 1950   MRN:  [id number]   Chief Complaint: Follow-up [facility] visit- recheck renal and magnesium)  Patient is a 70 year old male who presents for a hosp/er followup exam. The patient reports the following problems: see diagnosis. Health maintenance has been reviewed hold.   Lab Results  Component Value Date   CREATININE 1.13 07/24/2019   BUN 15 07/24/2019   NA 128 (L) 07/24/2019   K 3.9 07/24/2019   CL 90 (L) 07/24/2019   CO2 27 07/24/2019   Lab Results  Component Value Date   CHOL 163 10/17/2018   HDL 53 10/17/2018   LDLCALC 93 10/17/2018   TRIG 87 10/17/2018   CHOLHDL 2.8 09/01/2016   No results found for: TSH No results found for: HGBA1C Lab Results  Component Value Date   WBC 7.9 07/24/2019   HGB 15.4 07/24/2019   HCT 42.8 07/24/2019   MCV 94.9 07/24/2019   PLT 254 07/24/2019   Lab Results  Component Value Date   ALT 4 10/17/2018   AST 13 10/17/2018   ALKPHOS 110 10/17/2018   BILITOT 0.5 10/17/2018     Review of Systems  Constitutional: Negative for chills and fever.  HENT: Negative for drooling, ear discharge, ear pain and sore throat.   Respiratory: Negative for cough, shortness of breath and wheezing.   Cardiovascular: Negative for chest pain, palpitations and leg swelling.  Gastrointestinal: Negative for abdominal pain, blood in stool, constipation, diarrhea and nausea.  Endocrine: Negative for polydipsia.  Genitourinary: Negative for dysuria, frequency, hematuria and urgency.  Musculoskeletal: Negative for back pain, myalgias and neck pain.  Skin: Negative for rash.  Allergic/Immunologic: Negative for environmental allergies.  Neurological: Negative for dizziness and headaches.  Hematological: Does not bruise/bleed easily.  Psychiatric/Behavioral: Negative for suicidal ideas. The patient is not nervous/anxious.     Patient Active Problem List   Diagnosis Date Noted  . AAA  (abdominal aortic aneurysm) without rupture ([place]) 10/26/2019  . Aortic atherosclerosis ([place]) 10/17/2018  . Postoperative hypothyroidism 04/24/2018  . Depression 11/11/2017  . Thyroid cancer ([place]) 08/24/2017  . Displaced fracture of acromial process 08/16/2017  . Personal history of colonic polyps   . Benign neoplasm of descending colon   . Polyp of sigmoid colon   . Rectal polyp   . Mild episode of recurrent major depressive disorder ([place]) 07/14/2017  . 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 Corticobasal syndrome ([place]) 04/21/2017  . Parkinson disease ([place]) 04/21/2017  . Loss of memory 12/23/2016  . Rapidly progressive weakness 10/28/2016  . Ataxia 10/12/2016  . B12 deficiency 10/12/2016  . Dizziness 10/12/2016  . Essential hypertension 12/31/2014  . Hyperlipidemia 12/31/2014    No Known Allergies  Past Surgical History:  Procedure Laterality Date  . APPENDECTOMY    . CATARACT EXTRACTION Bilateral   . COLONOSCOPY  01/24/2012   cleared for 5 yrs- [facility]  . COLONOSCOPY WITH PROPOFOL N/A 07/15/2017   Procedure: COLONOSCOPY WITH PROPOFOL;  Surgeon: Lucilla Lame, MD;  Location: [place];  Service: Endoscopy;  Laterality: N/A;  . POLYPECTOMY  07/15/2017   Procedure: POLYPECTOMY INTESTINAL;  Surgeon: Lucilla Lame, MD;  Location: [place];  Service: Endoscopy;;  Ascending colon polyp Sigmoid colon polyp x 2 Rectal polyp x 2  . RETINAL DETACHMENT SURGERY Bilateral     Social History   Tobacco Use  . Smoking status:  Former Smoker    Packs/day: 1.50    Years: 40.00    Pack years: 60.00    Types: Cigarettes    Quit date: 07/2015    Years since quitting: 5.1  . Smokeless tobacco: Never Used  . Tobacco comment: smoking cessation materials not required  Vaping Use  . Vaping Use: Never used  Substance Use Topics  . Alcohol use: Not Currently    Alcohol/week: 14.0 standard drinks    Types: 14 Cans of beer per week  .  Drug use: No     Medication list has been reviewed and updated.  Current Meds  Medication Sig  . atorvastatin (LIPITOR) 10 MG tablet Take 1 tablet (10 mg total) by mouth daily.  . Calcium Carb-Cholecalciferol 600-800 MG-UNIT TABS   . carbidopa-levodopa (SINEMET IR) 25-100 MG tablet Take 3 tablets by mouth 3 (three) times daily.  . clonazePAM (KLONOPIN) 0.5 MG tablet Take 1 tablet by mouth at bedtime. neuro  . Infant Care Products (DERMACLOUD) CREA Apply topically daily as needed.  Marland Kitchen levothyroxine (SYNTHROID, LEVOTHROID) 100 MCG tablet Take 100 mcg by mouth daily. Dr [name]  . metoprolol succinate (TOPROL-XL) 100 MG 24 hr tablet Take with or immediately following a meal.  . Polyethylene Glycol 3350 (PEG 3350) 17 GM/SCOOP POWD Take by mouth. PRN only  . QUEtiapine (SEROQUEL) 25 MG tablet Take 25 mg by mouth at bedtime.  . sertraline (ZOLOFT) 50 MG tablet Take 1 tablet (50 mg total) by mouth daily.  Marland Kitchen testosterone cypionate (DEPOTESTOSTERONE CYPIONATE) 200 MG/ML injection Inject into the muscle. Inject 0.5 mls (100 mg) every 14 days  . Vitamin D, Ergocalciferol, (DRISDOL) 1.25 MG (50000 UT) CAPS capsule Take 50,000 Units by mouth once a week.    PHQ 2/9 Scores 09/12/2020 09/08/2020 04/10/2020 03/31/2020  PHQ - 2 Score 0 - 0 0  PHQ- 9 Score 3 - 2 -  Exception Documentation - Other- indicate reason in comment box - -  Not completed - having trouble with speech and mental change right now - -    GAD 7 : Generalized Anxiety Score 09/12/2020 04/10/2020 10/01/2019 08/31/2019  Nervous, Anxious, on Edge 0 0 2 0  Control/stop worrying 1 0 1 0  Worry too much - different things 1 1 1  0  Trouble relaxing 0 0 0 0  Restless 0 0 0 0  Easily annoyed or irritable 0 2 2 0  Afraid - awful might happen 0 0 0 0  Total GAD 7 Score 2 3 6  0  Anxiety Difficulty Somewhat difficult Not difficult at all Somewhat difficult -    BP Readings from Last 3 Encounters:  09/12/20 128/74  09/08/20 100/62  04/10/20  130/60    Physical Exam Vitals and nursing note reviewed.  HENT:     Head: Normocephalic.     Right Ear: External ear normal.     Left Ear: External ear normal.     Nose: Nose normal.  Eyes:     General: No scleral icterus.       Right eye: No discharge.        Left eye: No discharge.     Conjunctiva/sclera: Conjunctivae normal.     Pupils: Pupils are equal, round, and reactive to light.  Neck:     Thyroid: No thyromegaly.     Vascular: No JVD.     Trachea: No tracheal deviation.  Cardiovascular:     Rate and Rhythm: Normal rate and regular rhythm.     Heart  sounds: Normal heart sounds, S1 normal and S2 normal. No murmur heard.  No systolic murmur is present.  No diastolic murmur is present. No friction rub. No gallop. No S3 or S4 sounds.   Pulmonary:     Effort: No respiratory distress.     Breath sounds: Normal breath sounds. No decreased breath sounds, wheezing, rhonchi or rales.  Abdominal:     General: Bowel sounds are normal.     Palpations: Abdomen is soft. There is no mass.     Tenderness: There is no abdominal tenderness. There is no guarding or rebound.  Musculoskeletal:        General: No tenderness. Normal range of motion.     Cervical back: Normal range of motion and neck supple.  Lymphadenopathy:     Cervical: No cervical adenopathy.  Skin:    General: Skin is warm.     Findings: No rash.  Neurological:     Mental Status: He is alert and oriented to person, place, and time.     Cranial Nerves: No cranial nerve deficit.     Deep Tendon Reflexes: Reflexes are normal and symmetric.     Wt Readings from Last 3 Encounters:  09/12/20 148 lb (67.1 kg)  09/08/20 148 lb (67.1 kg)  04/10/20 148 lb (67.1 kg)    BP 128/74   Pulse 64   Ht 5\' 7"  (1.702 m)   Wt 148 lb (67.1 kg)   BMI 23.18 kg/m   Assessment and Plan:  1. Hospital discharge follow-up Patient is follow-up from extended evaluation at [facility] emergency room for which he received IVF and had  significant improvement in his overall mental status and physical wellbeing.  Patient continues to improve and is able to hydrate at home and we will continue in this direction.  2. Dehydration Multiple factors are improved including skin turgor, mentation, overall color, improved perfusion of extremities, and improved output.  Continue hydration orally with current fluid maintenance.  3. Hypomagnesemia History of hypomagnesia but slightly by 1/10.  We will recheck in treat accordingly.  4. Renal insufficiency Creatinine and GFR will be reevaluated with renal function panel.  5. Diarrhea, unspecified type Patient still has loose bowel movements but they are not of the watery nature as previous.  6. Bandemia Patient had a normal WBC count but there was bandemia.  I am not aware if he is on prednisone that may have been marginated these but we will recheck at this time.  7. Hypotension due to hypovolemia Blood pressure is improved to 128/74 and patient is able to sit in chair with normal muscle tone.

## 2020-09-13 LAB — CBC WITH DIFFERENTIAL/PLATELET
Basophils Absolute: 0.1 10*3/uL (ref 0.0–0.2)
Basos: 1 %
EOS (ABSOLUTE): 0.1 10*3/uL (ref 0.0–0.4)
Eos: 1 %
Hematocrit: 39.6 % (ref 37.5–51.0)
Hemoglobin: 13.6 g/dL (ref 13.0–17.7)
Immature Grans (Abs): 0.1 10*3/uL (ref 0.0–0.1)
Immature Granulocytes: 1 %
Lymphocytes Absolute: 1.6 10*3/uL (ref 0.7–3.1)
Lymphs: 27 %
MCH: 34.3 pg — ABNORMAL HIGH (ref 26.6–33.0)
MCHC: 34.3 g/dL (ref 31.5–35.7)
MCV: 100 fL — ABNORMAL HIGH (ref 79–97)
Monocytes Absolute: 0.6 10*3/uL (ref 0.1–0.9)
Monocytes: 10 %
Neutrophils Absolute: 3.5 10*3/uL (ref 1.4–7.0)
Neutrophils: 60 %
Platelets: 176 10*3/uL (ref 150–450)
RBC: 3.96 x10E6/uL — ABNORMAL LOW (ref 4.14–5.80)
RDW: 12.3 % (ref 11.6–15.4)
WBC: 5.9 10*3/uL (ref 3.4–10.8)

## 2020-09-13 LAB — RENAL FUNCTION PANEL
Albumin: 3.8 g/dL (ref 3.8–4.8)
BUN/Creatinine Ratio: 13 (ref 10–24)
BUN: 14 mg/dL (ref 8–27)
CO2: 25 mmol/L (ref 20–29)
Calcium: 8.3 mg/dL — ABNORMAL LOW (ref 8.6–10.2)
Chloride: 94 mmol/L — ABNORMAL LOW (ref 96–106)
Creatinine, Ser: 1.06 mg/dL (ref 0.76–1.27)
Glucose: 85 mg/dL (ref 65–99)
Phosphorus: 3.1 mg/dL (ref 2.8–4.1)
Potassium: 3.6 mmol/L (ref 3.5–5.2)
Sodium: 136 mmol/L (ref 134–144)
eGFR: 76 mL/min/{1.73_m2} (ref >59)

## 2020-09-13 LAB — MAGNESIUM: Magnesium: 1.9 mg/dL (ref 1.6–2.3)

## 2020-10-13 ENCOUNTER — Other Ambulatory Visit: Payer: Self-pay | Admitting: Family Medicine

## 2020-10-13 DIAGNOSIS — E785 Hyperlipidemia, unspecified: Secondary | ICD-10-CM

## 2020-10-13 DIAGNOSIS — F330 Major depressive disorder, recurrent, mild: Secondary | ICD-10-CM

## 2020-11-03 ENCOUNTER — Encounter: Payer: Self-pay | Admitting: Family Medicine

## 2020-11-04 ENCOUNTER — Ambulatory Visit: Payer: Self-pay | Admitting: Family Medicine

## 2020-11-11 ENCOUNTER — Ambulatory Visit
Admission: RE | Admit: 2020-11-11 | Discharge: 2020-11-11 | Disposition: A | Discharge status: Home / Self Care | Payer: Medicare Other | Source: Ambulatory Visit | Attending: Family Medicine | Admitting: Family Medicine

## 2020-11-11 ENCOUNTER — Encounter: Payer: Self-pay | Admitting: Family Medicine

## 2020-11-11 ENCOUNTER — Ambulatory Visit (INDEPENDENT_AMBULATORY_CARE_PROVIDER_SITE_OTHER): Payer: Medicare Other | Admitting: Family Medicine

## 2020-11-11 ENCOUNTER — Ambulatory Visit
Admission: RE | Admit: 2020-11-11 | Discharge: 2020-11-11 | Disposition: A | Discharge status: Home / Self Care | Payer: Medicare Other | Attending: Family Medicine | Admitting: Family Medicine

## 2020-11-11 ENCOUNTER — Other Ambulatory Visit: Payer: Self-pay

## 2020-11-11 VITALS — BP 110/66 | HR 60 | Temp 97.4°F | Ht 67.0 in | Wt 150.0 lb

## 2020-11-11 DIAGNOSIS — J219 Acute bronchiolitis, unspecified: Secondary | ICD-10-CM

## 2020-11-11 DIAGNOSIS — U099 Post covid-19 condition, unspecified: Secondary | ICD-10-CM

## 2020-11-11 DIAGNOSIS — J189 Pneumonia, unspecified organism: Secondary | ICD-10-CM | POA: Diagnosis not present

## 2020-11-11 DIAGNOSIS — R053 Chronic cough: Secondary | ICD-10-CM | POA: Insufficient documentation

## 2020-11-11 DIAGNOSIS — R059 Cough, unspecified: Secondary | ICD-10-CM | POA: Diagnosis not present

## 2020-11-11 MED ORDER — BENZONATATE 100 MG PO CAPS: 100.0000 mg | ORAL_CAPSULE | Freq: Three times a day (TID) | ORAL | 0 refills | Status: DC | PRN

## 2020-11-11 NOTE — Progress Notes (Signed)
Date:  11/11/2020   Name:  Travis Robertson   DOB:  [birth year]   MRN:  [id number]   Chief Complaint: Cough (Tested positive on July 10th. Still having cough- sounds congested, but not getting anything up with the cough. No fever, but is having some "rattling" noises at night. )  Cough This is a new problem. The current episode started 1 to 4 weeks ago (2 weeks). The problem has been waxing and waning. The problem occurs every few minutes. The cough is Non-productive. Associated symptoms include nasal congestion and postnasal drip. Pertinent negatives include no chest pain, chills, ear pain, fever, headaches, myalgias, rash, rhinorrhea, sore throat, shortness of breath or wheezing. There is no history of environmental allergies.   Lab Results  Component Value Date   CREATININE 1.06 09/12/2020   BUN 14 09/12/2020   NA 136 09/12/2020   K 3.6 09/12/2020   CL 94 (L) 09/12/2020   CO2 25 09/12/2020   Lab Results  Component Value Date   CHOL 163 10/17/2018   HDL 53 10/17/2018   LDLCALC 93 10/17/2018   TRIG 87 10/17/2018   CHOLHDL 2.8 09/01/2016   No results found for: TSH No results found for: HGBA1C Lab Results  Component Value Date   WBC 5.9 09/12/2020   HGB 13.6 09/12/2020   HCT 39.6 09/12/2020   MCV 100 (H) 09/12/2020   PLT 176 09/12/2020   Lab Results  Component Value Date   ALT 4 10/17/2018   AST 13 10/17/2018   ALKPHOS 110 10/17/2018   BILITOT 0.5 10/17/2018     Review of Systems  Constitutional:  Negative for chills and fever.  HENT:  Positive for postnasal drip. Negative for drooling, ear discharge, ear pain, rhinorrhea and sore throat.   Respiratory:  Positive for cough. Negative for shortness of breath and wheezing.   Cardiovascular:  Negative for chest pain, palpitations and leg swelling.  Gastrointestinal:  Negative for abdominal pain, blood in stool, constipation, diarrhea and nausea.  Endocrine: Negative for polydipsia.  Genitourinary:  Negative for  dysuria, frequency, hematuria and urgency.  Musculoskeletal:  Negative for back pain, myalgias and neck pain.  Skin:  Negative for rash.  Allergic/Immunologic: Negative for environmental allergies.  Neurological:  Negative for dizziness and headaches.  Hematological:  Does not bruise/bleed easily.  Psychiatric/Behavioral:  Negative for suicidal ideas. The patient is not nervous/anxious.    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 05/20/2017   Corticobasal syndrome ([place]) 04/21/2017   Parkinson disease ([place]) 04/21/2017   Loss of memory 12/23/2016   Rapidly progressive weakness 10/28/2016   Ataxia 10/12/2016   B12 deficiency 10/12/2016   Dizziness 10/12/2016   Essential hypertension 12/31/2014   Hyperlipidemia 12/31/2014    No Known Allergies  Past Surgical History:  Procedure Laterality Date   APPENDECTOMY     CATARACT EXTRACTION Bilateral    COLONOSCOPY  01/24/2012   cleared for 5 yrs- [facility]   COLONOSCOPY  WITH PROPOFOL N/A 07/15/2017   Procedure: COLONOSCOPY WITH PROPOFOL;  Surgeon: Lucilla Lame, MD;  Location: [place];  Service: Endoscopy;  Laterality: N/A;   POLYPECTOMY  07/15/2017   Procedure: POLYPECTOMY INTESTINAL;  Surgeon: Lucilla Lame, MD;  Location: [place];  Service: Endoscopy;;  Ascending colon polyp Sigmoid colon polyp x 2 Rectal polyp x 2   RETINAL DETACHMENT SURGERY Bilateral     Social History   Tobacco Use    Smoking status: Former    Packs/day: 1.50    Years: 40.00    Pack years: 60.00    Types: Cigarettes    Quit date: 07/2015    Years since quitting: 5.3   Smokeless tobacco: Never   Tobacco comments:    smoking cessation materials not required  Vaping Use   Vaping Use: Never used  Substance Use Topics   Alcohol use: Not Currently    Alcohol/week: 14.0 standard drinks    Types: 14 Cans of beer per week   Drug use: No     Medication list has been reviewed and updated.  Current Meds  Medication Sig   atorvastatin (LIPITOR) 10 MG tablet TAKE 1 TABLET BY MOUTH EVERY DAY   Calcium Carb-Cholecalciferol 600-800 MG-UNIT TABS    carbidopa-levodopa (SINEMET IR) 25-100 MG tablet Take 3 tablets by mouth 3 (three) times daily.   clonazePAM (KLONOPIN) 0.5 MG tablet Take 1 tablet by mouth at bedtime. neuro   Infant Care Products (DERMACLOUD) CREA Apply topically daily as needed.   levothyroxine (SYNTHROID, LEVOTHROID) 100 MCG tablet Take 100 mcg by mouth daily. Dr [name]   metoprolol succinate (TOPROL-XL) 100 MG 24 hr tablet Take with or immediately following a meal.   Polyethylene Glycol 3350 (PEG 3350) 17 GM/SCOOP POWD Take by mouth. PRN only   QUEtiapine (SEROQUEL) 25 MG tablet Take 25 mg by mouth at bedtime.   sertraline (ZOLOFT) 50 MG tablet TAKE 1 TABLET BY MOUTH EVERY DAY   testosterone cypionate (DEPOTESTOSTERONE CYPIONATE) 200 MG/ML injection Inject into the muscle. Inject 0.5 mls (100 mg) every 14 days   Vitamin D, Ergocalciferol, (DRISDOL) 1.25 MG (50000 UT) CAPS capsule Take 50,000 Units by mouth once a week.    PHQ 2/9 Scores 11/11/2020 09/12/2020 09/08/2020 04/10/2020  PHQ - 2 Score 1 0 - 0  PHQ- 9 Score 1 3 - 2  Exception Documentation - - Other- indicate reason in comment box -  Not completed - - having trouble with speech and mental change right now -    GAD 7 : Generalized Anxiety Score 11/11/2020 09/12/2020 04/10/2020 10/01/2019  Nervous, Anxious, on Edge 0 0 0 2   Control/stop worrying 0 1 0 1  Worry too much - different things 0 1 1 1   Trouble relaxing 0 0 0 0  Restless 0 0 0 0  Easily annoyed or irritable 0 0 2 2  Afraid - awful might happen 0 0 0 0  Total GAD 7 Score 0 2 3 6   Anxiety Difficulty - Somewhat difficult Not difficult at all Somewhat difficult    BP Readings from Last 3 Encounters:  11/11/20 110/66  09/12/20 128/74  09/08/20 100/62    Physical Exam Vitals and nursing note reviewed.  HENT:     Head: Normocephalic.     Right Ear: Tympanic membrane, ear canal and external ear normal.     Left Ear: Tympanic membrane, ear canal and external ear normal.     Nose: Nose normal. No congestion.  Mouth/Throat:     Mouth: Mucous membranes are moist.  Eyes:     General: No scleral icterus.       Right eye: No discharge.        Left eye: No discharge.     Conjunctiva/sclera: Conjunctivae normal.     Pupils: Pupils are equal, round, and reactive to light.  Neck:     Thyroid: No thyromegaly.     Vascular: No JVD.     Trachea: No tracheal deviation.  Cardiovascular:     Rate and Rhythm: Normal rate and regular rhythm.     Heart sounds: Normal heart sounds. No murmur heard.   No friction rub. No gallop.  Pulmonary:     Effort: No respiratory distress.     Breath sounds: Normal breath sounds. Decreased air movement present. No decreased breath sounds, wheezing, rhonchi or rales.     Comments: Low inspiratory effort Abdominal:     General: Bowel sounds are normal.     Palpations: Abdomen is soft. There is no mass.     Tenderness: There is no abdominal tenderness. There is no guarding or rebound.  Musculoskeletal:        General: No tenderness. Normal range of motion.     Cervical back: Normal range of motion and neck supple.  Lymphadenopathy:     Cervical: No cervical adenopathy.  Skin:    General: Skin is warm.     Findings: No rash.  Neurological:     Mental Status: He is alert and oriented to person, place, and time.      Cranial Nerves: No cranial nerve deficit.     Deep Tendon Reflexes: Reflexes are normal and symmetric.    Wt Readings from Last 3 Encounters:  11/11/20 150 lb (68 kg)  09/12/20 148 lb (67.1 kg)  09/08/20 148 lb (67.1 kg)    BP 110/66   Pulse 60   Temp (!) 97.4 F (36.3 C) (Oral)   Ht 5\' 7"  (1.702 m)   Wt 150 lb (68 kg)   SpO2 98%   BMI 23.49 kg/m   Assessment and Plan:  1. Bronchiolitis New onset.  Persistent.  Waxing and wanes in intensity.  Patient had positive COVID about 10 days ago and has since developed a nonproductive cough.  There is no shortness of breath or fever.  We will treat with continuance of Mucinex but also use Mucinex DM if cough is persistent as well as Tessalon Perles on a as needed basis. - DG Chest 2 View; Future - benzonatate (TESSALON PERLES) 100 MG capsule; Take 1 capsule (100 mg total) by mouth 3 (three) times daily as needed for cough.  Dispense: 20 capsule; Refill: 0  2. Post-COVID chronic cough Given the fact that the patient has compromised muscle tone and the recent occurrence of COVID we will get a chest x-ray to confirm that it is more of a bronchiolitic concern rather than a community-acquired pneumonia or post COVID pneumonitis. - DG Chest 2 View; Future

## 2020-11-12 DIAGNOSIS — Z993 Dependence on wheelchair: Secondary | ICD-10-CM | POA: Diagnosis not present

## 2020-11-13 ENCOUNTER — Other Ambulatory Visit: Payer: Self-pay | Admitting: Family Medicine

## 2020-11-13 DIAGNOSIS — I10 Essential (primary) hypertension: Secondary | ICD-10-CM

## 2020-11-17 DIAGNOSIS — R296 Repeated falls: Secondary | ICD-10-CM | POA: Diagnosis not present

## 2020-11-17 DIAGNOSIS — G3185 Corticobasal degeneration: Secondary | ICD-10-CM | POA: Diagnosis not present

## 2020-12-05 ENCOUNTER — Ambulatory Visit: Payer: Medicare Other | Admitting: Family Medicine

## 2020-12-15 ENCOUNTER — Encounter: Payer: Self-pay | Admitting: Family Medicine

## 2020-12-15 ENCOUNTER — Other Ambulatory Visit: Payer: Self-pay

## 2020-12-15 ENCOUNTER — Ambulatory Visit (INDEPENDENT_AMBULATORY_CARE_PROVIDER_SITE_OTHER): Payer: Medicare Other | Admitting: Family Medicine

## 2020-12-15 VITALS — BP 102/60 | HR 60 | Ht 67.0 in | Wt 150.0 lb

## 2020-12-15 DIAGNOSIS — I10 Essential (primary) hypertension: Secondary | ICD-10-CM

## 2020-12-15 DIAGNOSIS — F330 Major depressive disorder, recurrent, mild: Secondary | ICD-10-CM | POA: Diagnosis not present

## 2020-12-15 DIAGNOSIS — E785 Hyperlipidemia, unspecified: Secondary | ICD-10-CM

## 2020-12-15 MED ORDER — METOPROLOL SUCCINATE ER 50 MG PO TB24: 50.0000 mg | ORAL_TABLET | Freq: Every day | ORAL | 1 refills | Status: AC

## 2020-12-15 MED ORDER — ATORVASTATIN CALCIUM 10 MG PO TABS: 10.0000 mg | ORAL_TABLET | Freq: Every day | ORAL | 1 refills | Status: DC

## 2020-12-15 MED ORDER — SERTRALINE HCL 50 MG PO TABS: 50.0000 mg | ORAL_TABLET | Freq: Every day | ORAL | 1 refills | Status: AC

## 2020-12-15 NOTE — Progress Notes (Signed)
Date:  12/15/2020   Name:  Travis Robertson   DOB:  12/15/1950   MRN:  [id number]   Chief Complaint: Hyperlipidemia, Hypertension, and Depression  Hyperlipidemia This is a chronic problem. The current episode started more than 1 year ago. The problem is controlled. Recent lipid tests were reviewed and are normal. He has no history of chronic renal disease, diabetes, hypothyroidism or liver disease. Pertinent negatives include no chest pain, leg pain, myalgias or shortness of breath. Current antihyperlipidemic treatment includes statins. There are no compliance problems.  Risk factors for coronary artery disease include dyslipidemia and hypertension.  Hypertension This is a chronic problem. The current episode started more than 1 year ago. The problem has been gradually improving since onset. Pertinent negatives include no anxiety, chest pain, headaches, neck pain, palpitations, peripheral edema or shortness of breath. There is no history of angina, kidney disease, CAD/MI, CVA, heart failure, left ventricular hypertrophy, PVD or retinopathy. There is no history of chronic renal disease.  Depression        This is a chronic problem.  The current episode started more than 1 year ago.   The problem occurs constantly.  Associated symptoms include insomnia.  Associated symptoms include no decreased concentration, no fatigue, no helplessness, no hopelessness, not irritable, no restlessness, no decreased interest, no appetite change, no body aches, no myalgias, no headaches, not sad and no suicidal ideas.  Past treatments include SSRIs - Selective serotonin reuptake inhibitors.  Compliance with treatment is poor.  Previous treatment provided moderate relief.   Pertinent negatives include no hypothyroidism and no anxiety.  Lab Results  Component Value Date   CREATININE 1.06 09/12/2020   BUN 14 09/12/2020   NA 136 09/12/2020   K 3.6 09/12/2020   CL 94 (L) 09/12/2020   CO2 25 09/12/2020   Lab  Results  Component Value Date   CHOL 163 10/17/2018   HDL 53 10/17/2018   LDLCALC 93 10/17/2018   TRIG 87 10/17/2018   CHOLHDL 2.8 09/01/2016   No results found for: TSH No results found for: HGBA1C Lab Results  Component Value Date   WBC 5.9 09/12/2020   HGB 13.6 09/12/2020   HCT 39.6 09/12/2020   MCV 100 (H) 09/12/2020   PLT 176 09/12/2020   Lab Results  Component Value Date   ALT 4 10/17/2018   AST 13 10/17/2018   ALKPHOS 110 10/17/2018   BILITOT 0.5 10/17/2018     Review of Systems  Constitutional:  Negative for appetite change, chills, fatigue and fever.  HENT:  Negative for drooling, ear discharge, ear pain and sore throat.   Respiratory:  Negative for cough, shortness of breath and wheezing.   Cardiovascular:  Negative for chest pain, palpitations and leg swelling.  Gastrointestinal:  Negative for abdominal pain, blood in stool, constipation, diarrhea and nausea.  Endocrine: Negative for polydipsia.  Genitourinary:  Negative for dysuria, frequency, hematuria and urgency.  Musculoskeletal:  Negative for back pain, myalgias and neck pain.  Skin:  Negative for rash.  Allergic/Immunologic: Negative for environmental allergies.  Neurological:  Negative for dizziness and headaches.  Hematological:  Does not bruise/bleed easily.  Psychiatric/Behavioral:  Positive for depression. Negative for decreased concentration and suicidal ideas. The patient has insomnia. The patient is not nervous/anxious.    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   Corticobasal syndrome ([place]) 04/21/2017   Parkinson disease ([place]) 04/21/2017   Loss of memory 12/23/2016   Rapidly progressive weakness 10/28/2016   Ataxia 10/12/2016   B12 deficiency 10/12/2016   Dizziness 10/12/2016   Essential hypertension 12/31/2014   Hyperlipidemia 12/31/2014    No Known Allergies  Past Surgical History:  Procedure Laterality Date   APPENDECTOMY     CATARACT EXTRACTION Bilateral    COLONOSCOPY  01/24/2012   cleared for 5 yrs- [facility]   COLONOSCOPY WITH PROPOFOL N/A 07/15/2017   Procedure: COLONOSCOPY WITH PROPOFOL;  Surgeon: Lucilla Lame, MD;  Location: [facility];  Service: Endoscopy;  Laterality: N/A;   POLYPECTOMY  07/15/2017   Procedure: POLYPECTOMY INTESTINAL;  Surgeon: Lucilla Lame, MD;  Location: [facility];  Service: Endoscopy;;  Ascending colon polyp Sigmoid colon polyp x 2 Rectal polyp x 2   RETINAL DETACHMENT SURGERY Bilateral     Social History   Tobacco Use   Smoking status: Former    Packs/day: 1.50    Years: 40.00    Pack years: 60.00    Types: Cigarettes    Quit date: 07/2015    Years since quitting: 5.3   Smokeless tobacco: Never   Tobacco comments:    smoking cessation materials not required  Vaping Use   Vaping Use: Never used  Substance Use Topics   Alcohol use: Not Currently    Alcohol/week: 14.0 standard drinks    Types: 14 Cans of beer per week   Drug use: No     Medication list has been reviewed and updated.  Current Meds  Medication Sig   atorvastatin (LIPITOR) 10 MG tablet TAKE 1 TABLET BY MOUTH EVERY DAY   Calcium Carb-Cholecalciferol 600-800 MG-UNIT TABS    carbidopa-levodopa (SINEMET IR) 25-100 MG tablet Take 3 tablets by mouth 3 (three) times daily.   clonazePAM (KLONOPIN) 0.5 MG tablet Take 1 tablet by mouth at bedtime. neuro   Infant Care Products (DERMACLOUD) CREA Apply  topically daily as needed.   levothyroxine (SYNTHROID, LEVOTHROID) 100 MCG tablet Take 100 mcg by mouth daily. Dr [name]   metoprolol succinate (TOPROL-XL) 100 MG 24 hr tablet Take with or immediately following a meal.   Polyethylene Glycol 3350 (PEG 3350) 17 GM/SCOOP POWD Take by mouth. PRN only   QUEtiapine (SEROQUEL) 25 MG tablet Take 25 mg by mouth at bedtime.   sertraline (ZOLOFT) 50 MG tablet TAKE 1 TABLET BY MOUTH EVERY DAY   testosterone cypionate (DEPOTESTOSTERONE CYPIONATE) 200 MG/ML injection Inject into the muscle. Inject 0.5 mls (100 mg) every 14 days   Vitamin D, Ergocalciferol, (DRISDOL) 1.25 MG (50000 UT) CAPS capsule Take 50,000 Units by mouth once a week.    PHQ 2/9 Scores 12/15/2020 11/11/2020 09/12/2020 09/08/2020  PHQ - 2 Score 0 1 0 -  PHQ- 9 Score '2 1 3 '$ -  Exception Documentation - - - Other- indicate reason in comment box  Not completed - - - having trouble with speech and mental change right now    GAD 7 : Generalized Anxiety Score 12/15/2020 11/11/2020 09/12/2020 04/10/2020  Nervous, Anxious, on Edge  0 0 0 0  Control/stop worrying 0 0 1 0  Worry too much - different things 0 0 1 1  Trouble relaxing 0 0 0 0  Restless 0 0 0 0  Easily annoyed or irritable 0 0 0 2  Afraid - awful might happen 0 0 0 0  Total GAD 7 Score 0 0 2 3  Anxiety Difficulty - - Somewhat difficult Not difficult at all    BP Readings from Last 3 Encounters:  12/15/20 102/60  11/11/20 110/66  09/12/20 128/74    Physical Exam Vitals and nursing note reviewed.  Constitutional:      General: He is not irritable. HENT:     Head: Normocephalic.     Right Ear: Tympanic membrane normal.     Left Ear: Tympanic membrane normal.     Nose: Nose normal.     Mouth/Throat:     Mouth: Mucous membranes are moist.  Eyes:     Pupils: Pupils are equal, round, and reactive to light.  Pulmonary:     Breath sounds: No wheezing or rhonchi.  Abdominal:     Tenderness: There is no guarding or rebound.   Musculoskeletal:        General: Normal range of motion.     Cervical back: Normal range of motion.  Neurological:     Cranial Nerves: No cranial nerve deficit.     Motor: No weakness.    Wt Readings from Last 3 Encounters:  12/15/20 150 lb (68 kg)  11/11/20 150 lb (68 kg)  09/12/20 148 lb (67.1 kg)    BP 102/60   Pulse 60   Ht '5\' 7"'$  (1.702 m)   Wt 150 lb (68 kg)   BMI 23.49 kg/m   Assessment and Plan:  1. Hyperlipidemia, unspecified hyperlipidemia type Chronic.  Controlled.  Stable.  Continue atorvastatin 10 mg once a day. - atorvastatin (LIPITOR) 10 MG tablet; Take 1 tablet (10 mg total) by mouth daily.  Dispense: 90 tablet; Refill: 1  2. Essential hypertension Chronic.  Controlled.  In fact is probably too controlled and given his relative lack of activity we will decrease his Toprol-XL to 50 mg once a day and will recheck in 4 to 6 months.  3. Mild episode of recurrent major depressive disorder (HCC) Chronic.  Controlled.  Stable.  PHQ is 1 Gad score is 0 continue sertraline 50 mg once a day. - sertraline (ZOLOFT) 50 MG tablet; Take 1 tablet (50 mg total) by mouth daily.  Dispense: 90 tablet; Refill: 1

## 2020-12-17 DIAGNOSIS — F0280 Dementia in other diseases classified elsewhere without behavioral disturbance: Secondary | ICD-10-CM | POA: Diagnosis not present

## 2020-12-17 DIAGNOSIS — G20 Parkinson's disease: Secondary | ICD-10-CM | POA: Diagnosis not present

## 2020-12-17 DIAGNOSIS — R471 Dysarthria and anarthria: Secondary | ICD-10-CM | POA: Diagnosis not present

## 2020-12-17 DIAGNOSIS — Z9181 History of falling: Secondary | ICD-10-CM | POA: Diagnosis not present

## 2020-12-17 DIAGNOSIS — G3185 Corticobasal degeneration: Secondary | ICD-10-CM | POA: Diagnosis not present

## 2020-12-24 DIAGNOSIS — Z9181 History of falling: Secondary | ICD-10-CM | POA: Diagnosis not present

## 2020-12-24 DIAGNOSIS — G3185 Corticobasal degeneration: Secondary | ICD-10-CM | POA: Diagnosis not present

## 2020-12-24 DIAGNOSIS — G20 Parkinson's disease: Secondary | ICD-10-CM | POA: Diagnosis not present

## 2020-12-24 DIAGNOSIS — R471 Dysarthria and anarthria: Secondary | ICD-10-CM | POA: Diagnosis not present

## 2020-12-24 DIAGNOSIS — F0280 Dementia in other diseases classified elsewhere without behavioral disturbance: Secondary | ICD-10-CM | POA: Diagnosis not present

## 2020-12-25 DIAGNOSIS — G3185 Corticobasal degeneration: Secondary | ICD-10-CM | POA: Diagnosis not present

## 2020-12-31 DIAGNOSIS — Z9181 History of falling: Secondary | ICD-10-CM | POA: Diagnosis not present

## 2020-12-31 DIAGNOSIS — G20 Parkinson's disease: Secondary | ICD-10-CM | POA: Diagnosis not present

## 2020-12-31 DIAGNOSIS — R471 Dysarthria and anarthria: Secondary | ICD-10-CM | POA: Diagnosis not present

## 2020-12-31 DIAGNOSIS — G3185 Corticobasal degeneration: Secondary | ICD-10-CM | POA: Diagnosis not present

## 2020-12-31 DIAGNOSIS — F0280 Dementia in other diseases classified elsewhere without behavioral disturbance: Secondary | ICD-10-CM | POA: Diagnosis not present

## 2021-01-01 DIAGNOSIS — G3185 Corticobasal degeneration: Secondary | ICD-10-CM | POA: Diagnosis not present

## 2021-01-01 DIAGNOSIS — Z9181 History of falling: Secondary | ICD-10-CM | POA: Diagnosis not present

## 2021-01-01 DIAGNOSIS — R471 Dysarthria and anarthria: Secondary | ICD-10-CM | POA: Diagnosis not present

## 2021-01-01 DIAGNOSIS — G20 Parkinson's disease: Secondary | ICD-10-CM | POA: Diagnosis not present

## 2021-01-01 DIAGNOSIS — F0280 Dementia in other diseases classified elsewhere without behavioral disturbance: Secondary | ICD-10-CM | POA: Diagnosis not present

## 2021-01-07 DIAGNOSIS — Z9181 History of falling: Secondary | ICD-10-CM | POA: Diagnosis not present

## 2021-01-07 DIAGNOSIS — G20 Parkinson's disease: Secondary | ICD-10-CM | POA: Diagnosis not present

## 2021-01-07 DIAGNOSIS — F0280 Dementia in other diseases classified elsewhere without behavioral disturbance: Secondary | ICD-10-CM | POA: Diagnosis not present

## 2021-01-07 DIAGNOSIS — G3185 Corticobasal degeneration: Secondary | ICD-10-CM | POA: Diagnosis not present

## 2021-01-07 DIAGNOSIS — R471 Dysarthria and anarthria: Secondary | ICD-10-CM | POA: Diagnosis not present

## 2021-01-08 DIAGNOSIS — G20 Parkinson's disease: Secondary | ICD-10-CM | POA: Diagnosis not present

## 2021-01-08 DIAGNOSIS — Z9181 History of falling: Secondary | ICD-10-CM | POA: Diagnosis not present

## 2021-01-08 DIAGNOSIS — F0280 Dementia in other diseases classified elsewhere without behavioral disturbance: Secondary | ICD-10-CM | POA: Diagnosis not present

## 2021-01-08 DIAGNOSIS — G3185 Corticobasal degeneration: Secondary | ICD-10-CM | POA: Diagnosis not present

## 2021-01-08 DIAGNOSIS — R471 Dysarthria and anarthria: Secondary | ICD-10-CM | POA: Diagnosis not present

## 2021-01-09 DIAGNOSIS — Z9181 History of falling: Secondary | ICD-10-CM | POA: Diagnosis not present

## 2021-01-09 DIAGNOSIS — F0280 Dementia in other diseases classified elsewhere without behavioral disturbance: Secondary | ICD-10-CM | POA: Diagnosis not present

## 2021-01-09 DIAGNOSIS — G20 Parkinson's disease: Secondary | ICD-10-CM | POA: Diagnosis not present

## 2021-01-09 DIAGNOSIS — G3185 Corticobasal degeneration: Secondary | ICD-10-CM | POA: Diagnosis not present

## 2021-01-09 DIAGNOSIS — R471 Dysarthria and anarthria: Secondary | ICD-10-CM | POA: Diagnosis not present

## 2021-01-14 DIAGNOSIS — Z9181 History of falling: Secondary | ICD-10-CM | POA: Diagnosis not present

## 2021-01-14 DIAGNOSIS — G3185 Corticobasal degeneration: Secondary | ICD-10-CM | POA: Diagnosis not present

## 2021-01-14 DIAGNOSIS — G20 Parkinson's disease: Secondary | ICD-10-CM | POA: Diagnosis not present

## 2021-01-14 DIAGNOSIS — F0280 Dementia in other diseases classified elsewhere without behavioral disturbance: Secondary | ICD-10-CM | POA: Diagnosis not present

## 2021-01-14 DIAGNOSIS — R471 Dysarthria and anarthria: Secondary | ICD-10-CM | POA: Diagnosis not present

## 2021-01-15 DIAGNOSIS — E230 Hypopituitarism: Secondary | ICD-10-CM | POA: Diagnosis not present

## 2021-01-15 DIAGNOSIS — E890 Postprocedural hypothyroidism: Secondary | ICD-10-CM | POA: Diagnosis not present

## 2021-01-15 DIAGNOSIS — Z79899 Other long term (current) drug therapy: Secondary | ICD-10-CM | POA: Diagnosis not present

## 2021-01-15 DIAGNOSIS — E559 Vitamin D deficiency, unspecified: Secondary | ICD-10-CM | POA: Diagnosis not present

## 2021-01-15 DIAGNOSIS — R739 Hyperglycemia, unspecified: Secondary | ICD-10-CM | POA: Diagnosis not present

## 2021-01-15 DIAGNOSIS — M810 Age-related osteoporosis without current pathological fracture: Secondary | ICD-10-CM | POA: Diagnosis not present

## 2021-01-15 DIAGNOSIS — C73 Malignant neoplasm of thyroid gland: Secondary | ICD-10-CM | POA: Diagnosis not present

## 2021-01-16 DIAGNOSIS — G20 Parkinson's disease: Secondary | ICD-10-CM | POA: Diagnosis not present

## 2021-01-16 DIAGNOSIS — G3185 Corticobasal degeneration: Secondary | ICD-10-CM | POA: Diagnosis not present

## 2021-01-16 DIAGNOSIS — Z9181 History of falling: Secondary | ICD-10-CM | POA: Diagnosis not present

## 2021-01-16 DIAGNOSIS — F0280 Dementia in other diseases classified elsewhere without behavioral disturbance: Secondary | ICD-10-CM | POA: Diagnosis not present

## 2021-01-16 DIAGNOSIS — R471 Dysarthria and anarthria: Secondary | ICD-10-CM | POA: Diagnosis not present

## 2021-01-21 DIAGNOSIS — Z9181 History of falling: Secondary | ICD-10-CM | POA: Diagnosis not present

## 2021-01-21 DIAGNOSIS — F0280 Dementia in other diseases classified elsewhere without behavioral disturbance: Secondary | ICD-10-CM | POA: Diagnosis not present

## 2021-01-21 DIAGNOSIS — R471 Dysarthria and anarthria: Secondary | ICD-10-CM | POA: Diagnosis not present

## 2021-01-21 DIAGNOSIS — G3185 Corticobasal degeneration: Secondary | ICD-10-CM | POA: Diagnosis not present

## 2021-01-21 DIAGNOSIS — G20 Parkinson's disease: Secondary | ICD-10-CM | POA: Diagnosis not present

## 2021-01-22 DIAGNOSIS — C73 Malignant neoplasm of thyroid gland: Secondary | ICD-10-CM | POA: Diagnosis not present

## 2021-01-23 DIAGNOSIS — F0280 Dementia in other diseases classified elsewhere without behavioral disturbance: Secondary | ICD-10-CM | POA: Diagnosis not present

## 2021-01-23 DIAGNOSIS — Z9181 History of falling: Secondary | ICD-10-CM | POA: Diagnosis not present

## 2021-01-23 DIAGNOSIS — R471 Dysarthria and anarthria: Secondary | ICD-10-CM | POA: Diagnosis not present

## 2021-01-23 DIAGNOSIS — G3185 Corticobasal degeneration: Secondary | ICD-10-CM | POA: Diagnosis not present

## 2021-01-23 DIAGNOSIS — G20 Parkinson's disease: Secondary | ICD-10-CM | POA: Diagnosis not present

## 2021-01-27 DIAGNOSIS — F0280 Dementia in other diseases classified elsewhere without behavioral disturbance: Secondary | ICD-10-CM | POA: Diagnosis not present

## 2021-01-27 DIAGNOSIS — R471 Dysarthria and anarthria: Secondary | ICD-10-CM | POA: Diagnosis not present

## 2021-01-27 DIAGNOSIS — G20 Parkinson's disease: Secondary | ICD-10-CM | POA: Diagnosis not present

## 2021-01-27 DIAGNOSIS — G3185 Corticobasal degeneration: Secondary | ICD-10-CM | POA: Diagnosis not present

## 2021-01-27 DIAGNOSIS — Z9181 History of falling: Secondary | ICD-10-CM | POA: Diagnosis not present

## 2021-01-28 DIAGNOSIS — F0280 Dementia in other diseases classified elsewhere without behavioral disturbance: Secondary | ICD-10-CM | POA: Diagnosis not present

## 2021-01-28 DIAGNOSIS — G20 Parkinson's disease: Secondary | ICD-10-CM | POA: Diagnosis not present

## 2021-01-28 DIAGNOSIS — R471 Dysarthria and anarthria: Secondary | ICD-10-CM | POA: Diagnosis not present

## 2021-01-28 DIAGNOSIS — G3185 Corticobasal degeneration: Secondary | ICD-10-CM | POA: Diagnosis not present

## 2021-01-28 DIAGNOSIS — Z9181 History of falling: Secondary | ICD-10-CM | POA: Diagnosis not present

## 2021-02-02 ENCOUNTER — Other Ambulatory Visit: Payer: Self-pay | Admitting: Family Medicine

## 2021-02-02 DIAGNOSIS — I10 Essential (primary) hypertension: Secondary | ICD-10-CM

## 2021-02-05 DIAGNOSIS — G3185 Corticobasal degeneration: Secondary | ICD-10-CM | POA: Diagnosis not present

## 2021-02-05 DIAGNOSIS — R471 Dysarthria and anarthria: Secondary | ICD-10-CM | POA: Diagnosis not present

## 2021-02-05 DIAGNOSIS — F0280 Dementia in other diseases classified elsewhere without behavioral disturbance: Secondary | ICD-10-CM | POA: Diagnosis not present

## 2021-02-05 DIAGNOSIS — G20 Parkinson's disease: Secondary | ICD-10-CM | POA: Diagnosis not present

## 2021-02-05 DIAGNOSIS — Z9181 History of falling: Secondary | ICD-10-CM | POA: Diagnosis not present

## 2021-02-06 DIAGNOSIS — Z9181 History of falling: Secondary | ICD-10-CM | POA: Diagnosis not present

## 2021-02-06 DIAGNOSIS — R471 Dysarthria and anarthria: Secondary | ICD-10-CM | POA: Diagnosis not present

## 2021-02-06 DIAGNOSIS — F0280 Dementia in other diseases classified elsewhere without behavioral disturbance: Secondary | ICD-10-CM | POA: Diagnosis not present

## 2021-02-06 DIAGNOSIS — G3185 Corticobasal degeneration: Secondary | ICD-10-CM | POA: Diagnosis not present

## 2021-02-06 DIAGNOSIS — G20 Parkinson's disease: Secondary | ICD-10-CM | POA: Diagnosis not present

## 2021-02-10 DIAGNOSIS — F0280 Dementia in other diseases classified elsewhere without behavioral disturbance: Secondary | ICD-10-CM | POA: Diagnosis not present

## 2021-02-10 DIAGNOSIS — Z9181 History of falling: Secondary | ICD-10-CM | POA: Diagnosis not present

## 2021-02-10 DIAGNOSIS — R471 Dysarthria and anarthria: Secondary | ICD-10-CM | POA: Diagnosis not present

## 2021-02-10 DIAGNOSIS — G20 Parkinson's disease: Secondary | ICD-10-CM | POA: Diagnosis not present

## 2021-02-10 DIAGNOSIS — G3185 Corticobasal degeneration: Secondary | ICD-10-CM | POA: Diagnosis not present

## 2021-02-11 DIAGNOSIS — H04123 Dry eye syndrome of bilateral lacrimal glands: Secondary | ICD-10-CM | POA: Diagnosis not present

## 2021-02-11 DIAGNOSIS — Z961 Presence of intraocular lens: Secondary | ICD-10-CM | POA: Diagnosis not present

## 2021-02-11 DIAGNOSIS — H338 Other retinal detachments: Secondary | ICD-10-CM | POA: Diagnosis not present

## 2021-02-11 DIAGNOSIS — M3501 Sicca syndrome with keratoconjunctivitis: Secondary | ICD-10-CM | POA: Diagnosis not present

## 2021-02-13 DIAGNOSIS — G20 Parkinson's disease: Secondary | ICD-10-CM | POA: Diagnosis not present

## 2021-02-13 DIAGNOSIS — Z9181 History of falling: Secondary | ICD-10-CM | POA: Diagnosis not present

## 2021-02-13 DIAGNOSIS — G3185 Corticobasal degeneration: Secondary | ICD-10-CM | POA: Diagnosis not present

## 2021-02-13 DIAGNOSIS — R471 Dysarthria and anarthria: Secondary | ICD-10-CM | POA: Diagnosis not present

## 2021-02-13 DIAGNOSIS — F0280 Dementia in other diseases classified elsewhere without behavioral disturbance: Secondary | ICD-10-CM | POA: Diagnosis not present

## 2021-02-15 DIAGNOSIS — G3185 Corticobasal degeneration: Secondary | ICD-10-CM | POA: Diagnosis not present

## 2021-02-15 DIAGNOSIS — F0280 Dementia in other diseases classified elsewhere without behavioral disturbance: Secondary | ICD-10-CM | POA: Diagnosis not present

## 2021-02-15 DIAGNOSIS — G20 Parkinson's disease: Secondary | ICD-10-CM | POA: Diagnosis not present

## 2021-02-15 DIAGNOSIS — Z9181 History of falling: Secondary | ICD-10-CM | POA: Diagnosis not present

## 2021-02-15 DIAGNOSIS — R471 Dysarthria and anarthria: Secondary | ICD-10-CM | POA: Diagnosis not present

## 2021-02-18 DIAGNOSIS — G20 Parkinson's disease: Secondary | ICD-10-CM | POA: Diagnosis not present

## 2021-02-18 DIAGNOSIS — G3185 Corticobasal degeneration: Secondary | ICD-10-CM | POA: Diagnosis not present

## 2021-02-18 DIAGNOSIS — Z9181 History of falling: Secondary | ICD-10-CM | POA: Diagnosis not present

## 2021-02-18 DIAGNOSIS — F0280 Dementia in other diseases classified elsewhere without behavioral disturbance: Secondary | ICD-10-CM | POA: Diagnosis not present

## 2021-02-18 DIAGNOSIS — R471 Dysarthria and anarthria: Secondary | ICD-10-CM | POA: Diagnosis not present

## 2021-02-27 DIAGNOSIS — G20 Parkinson's disease: Secondary | ICD-10-CM | POA: Diagnosis not present

## 2021-02-27 DIAGNOSIS — R471 Dysarthria and anarthria: Secondary | ICD-10-CM | POA: Diagnosis not present

## 2021-02-27 DIAGNOSIS — Z9181 History of falling: Secondary | ICD-10-CM | POA: Diagnosis not present

## 2021-02-27 DIAGNOSIS — F0280 Dementia in other diseases classified elsewhere without behavioral disturbance: Secondary | ICD-10-CM | POA: Diagnosis not present

## 2021-02-27 DIAGNOSIS — G3185 Corticobasal degeneration: Secondary | ICD-10-CM | POA: Diagnosis not present

## 2021-03-06 DIAGNOSIS — F0280 Dementia in other diseases classified elsewhere without behavioral disturbance: Secondary | ICD-10-CM | POA: Diagnosis not present

## 2021-03-06 DIAGNOSIS — G3185 Corticobasal degeneration: Secondary | ICD-10-CM | POA: Diagnosis not present

## 2021-03-06 DIAGNOSIS — R471 Dysarthria and anarthria: Secondary | ICD-10-CM | POA: Diagnosis not present

## 2021-03-06 DIAGNOSIS — G20 Parkinson's disease: Secondary | ICD-10-CM | POA: Diagnosis not present

## 2021-03-06 DIAGNOSIS — Z9181 History of falling: Secondary | ICD-10-CM | POA: Diagnosis not present

## 2021-03-09 DIAGNOSIS — Z9181 History of falling: Secondary | ICD-10-CM | POA: Diagnosis not present

## 2021-03-09 DIAGNOSIS — G20 Parkinson's disease: Secondary | ICD-10-CM | POA: Diagnosis not present

## 2021-03-09 DIAGNOSIS — F0280 Dementia in other diseases classified elsewhere without behavioral disturbance: Secondary | ICD-10-CM | POA: Diagnosis not present

## 2021-03-09 DIAGNOSIS — R5382 Chronic fatigue, unspecified: Secondary | ICD-10-CM | POA: Diagnosis not present

## 2021-03-09 DIAGNOSIS — R2689 Other abnormalities of gait and mobility: Secondary | ICD-10-CM | POA: Diagnosis not present

## 2021-03-09 DIAGNOSIS — R471 Dysarthria and anarthria: Secondary | ICD-10-CM | POA: Diagnosis not present

## 2021-03-09 DIAGNOSIS — G3185 Corticobasal degeneration: Secondary | ICD-10-CM | POA: Diagnosis not present

## 2021-03-16 DIAGNOSIS — I119 Hypertensive heart disease without heart failure: Secondary | ICD-10-CM | POA: Diagnosis not present

## 2021-03-16 DIAGNOSIS — R482 Apraxia: Secondary | ICD-10-CM | POA: Diagnosis not present

## 2021-03-16 DIAGNOSIS — I700 Atherosclerosis of aorta: Secondary | ICD-10-CM | POA: Diagnosis not present

## 2021-03-16 DIAGNOSIS — C73 Malignant neoplasm of thyroid gland: Secondary | ICD-10-CM | POA: Diagnosis not present

## 2021-03-16 DIAGNOSIS — E890 Postprocedural hypothyroidism: Secondary | ICD-10-CM | POA: Diagnosis not present

## 2021-03-16 DIAGNOSIS — E785 Hyperlipidemia, unspecified: Secondary | ICD-10-CM | POA: Diagnosis not present

## 2021-03-16 DIAGNOSIS — F330 Major depressive disorder, recurrent, mild: Secondary | ICD-10-CM | POA: Diagnosis not present

## 2021-03-16 DIAGNOSIS — G3185 Corticobasal degeneration: Secondary | ICD-10-CM | POA: Diagnosis not present

## 2021-03-19 IMAGING — CT CT CERVICAL SPINE W/O CM
2 of 3 series · 11 of 27 positions shown, 14 images · non-contrast
Comparison: Head CT 04/13/2018

CLINICAL DATA: Head trauma, headache. Head trauma, headache, fall.
Additional history provided

EXAM:
CT HEAD WITHOUT CONTRAST
CT MAXILLOFACIAL WITHOUT CONTRAST
CT CERVICAL SPINE WITHOUT CONTRAST
TECHNIQUE: Multidetector CT imaging of the head, cervical spine, and
maxillofacial structures were performed using the standard protocol
without intravenous contrast. Multiplanar CT image reconstructions
of the cervical spine and maxillofacial structures were also
generated.

[Series 14: thins st · axial · 0.38mm/px · z∈[-325,-195]mm · 6 of 326 slices shown, 8 images]
[im 33/326  soft-tissue]
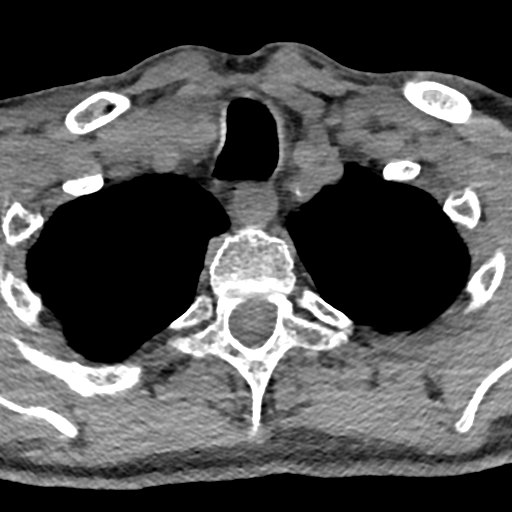
[im 33/326  bone]
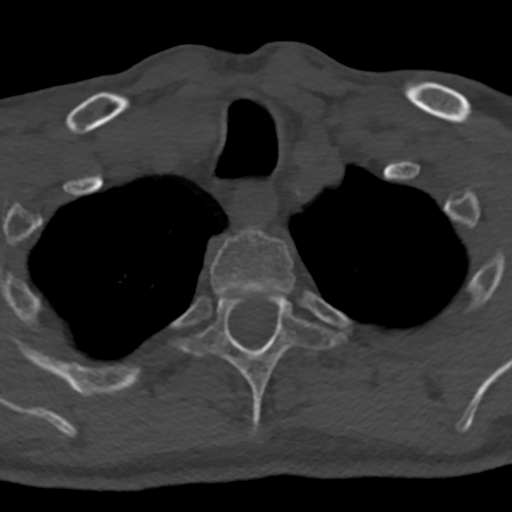
[im 98/326  bone]
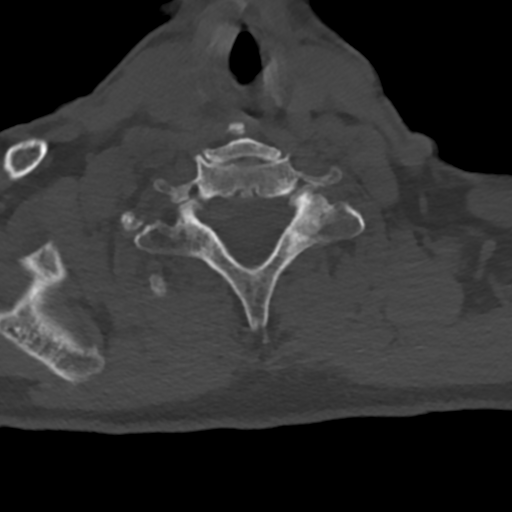
[im 131/326  bone]
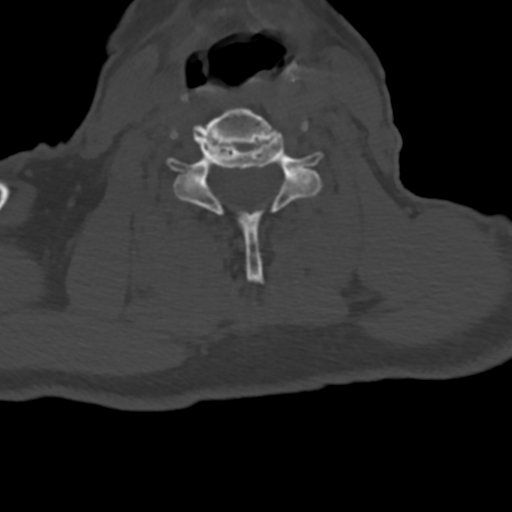
[im 196/326  bone]
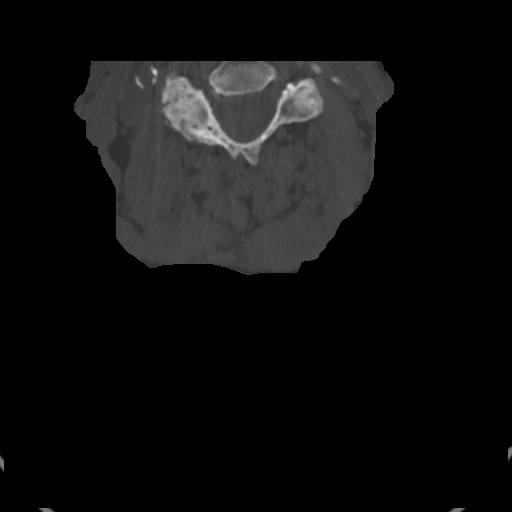
[im 228/326  soft-tissue]
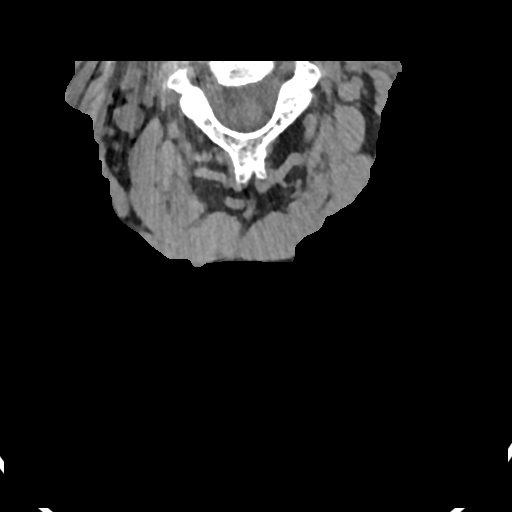
[im 228/326  bone]
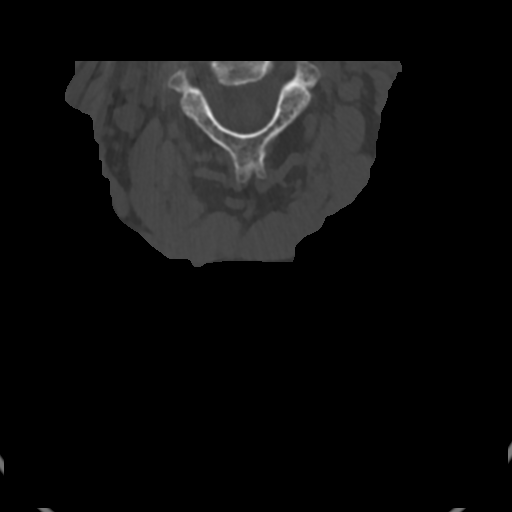
[im 293/326  bone]
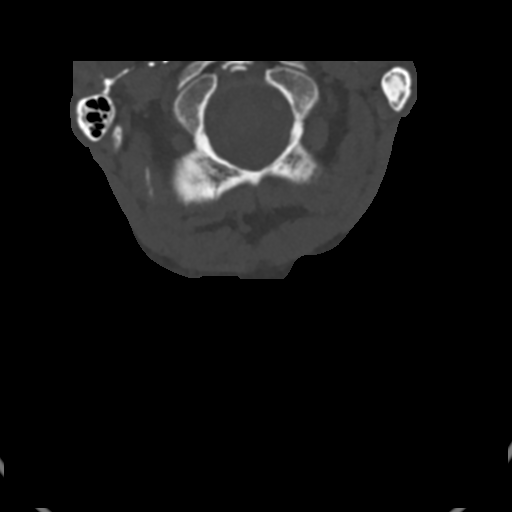

[Series 15: sagittal bone · sagittal · 0.26mm/px · 5 of 50 slices shown, 6 images]
[im 17/50  bone]
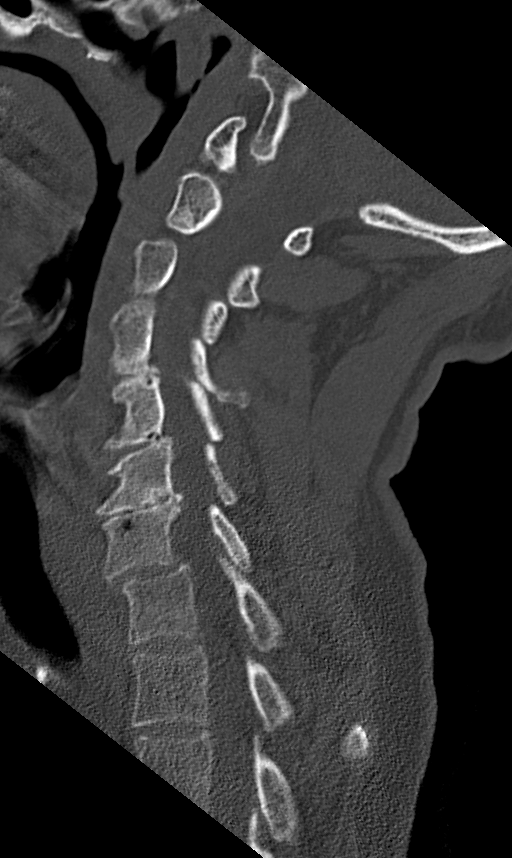
[im 21/50  bone]
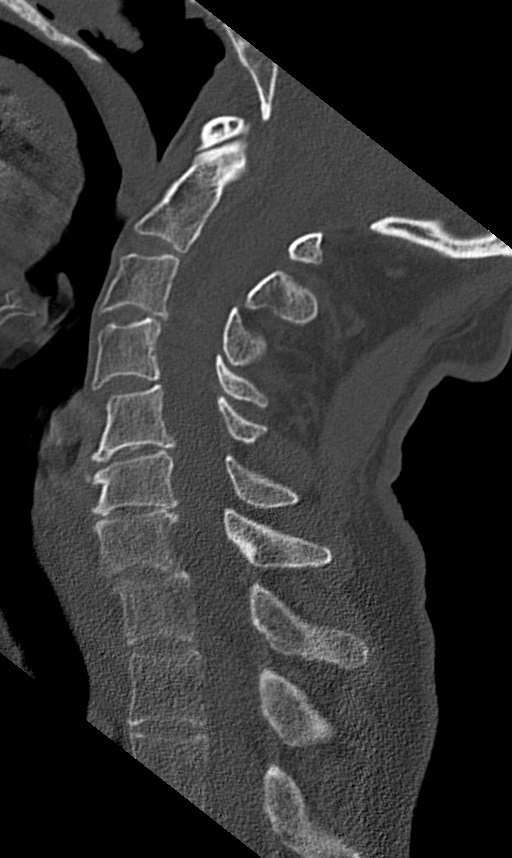
[im 25/50  soft-tissue]
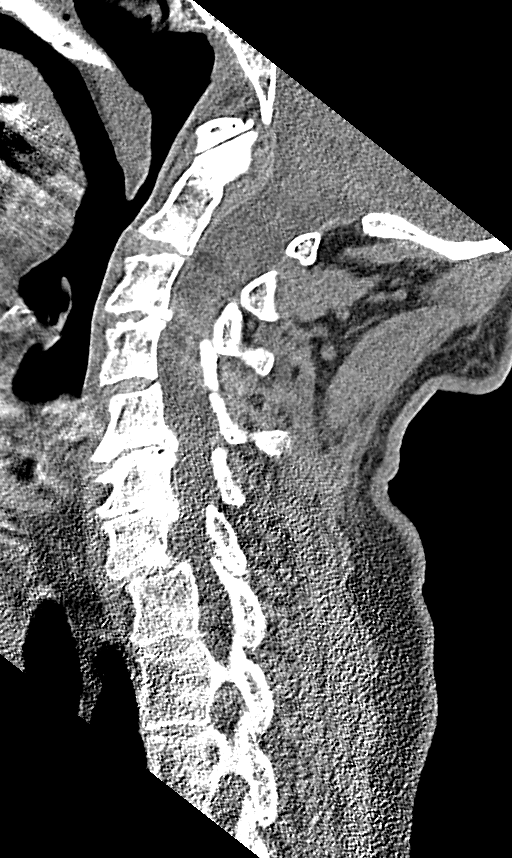
[im 25/50  bone]
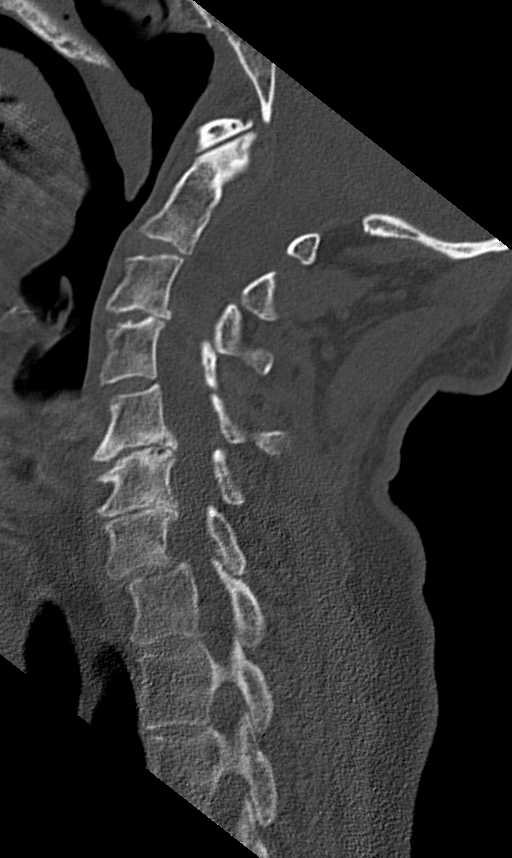
[im 29/50  bone]
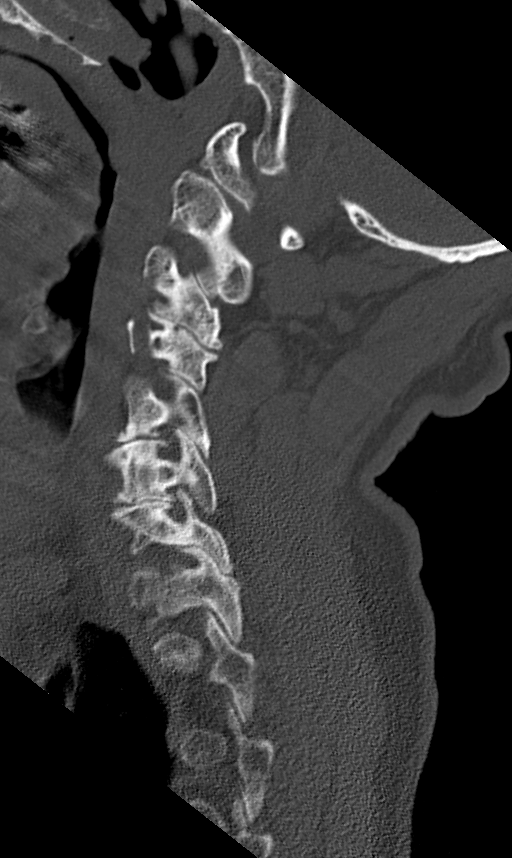
[im 33/50  bone]
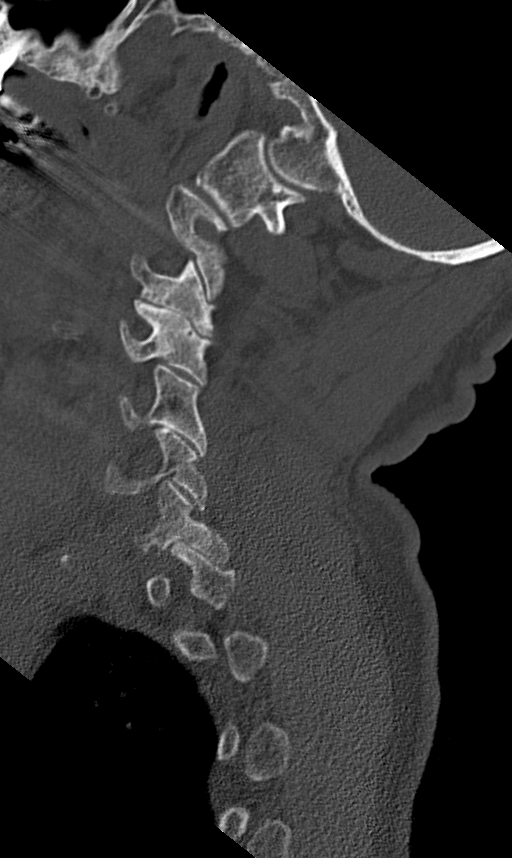

[11 of 27 positions shown; findings below may reference images not displayed]

FINDINGS: CT HEAD FINDINGS

Brain: There is no evidence of acute intracranial hemorrhage,
intracranial mass, midline shift or extra-axial fluid collection.No
demarcated cortical infarction. Moderate generalized parenchymal
atrophy.

Vascular: No hyperdense vessel.  Atherosclerotic calcifications.

Skull: Normal. Negative for fracture or focal lesion.

CT MAXILLOFACIAL FINDINGS

Osseous: There is a comminuted and displaced fracture of the right
nasal bone. There is also a minimally displaced fracture of the left
nasal bone (series 6, image 16). No definite fracture to the nasal
septum is identified.

Orbits: No evidence of acute abnormality. Bilateral scleral buckles
redemonstrated. Metallic foreign bodies within both orbits which may
be related to the scleral buckles or may reflect glaucoma valve.

Sinuses: Mild scattered paranasal sinus mucosal thickening.
Secretions within the nasal cavity which may reflect blood products.

Soft tissues: Prominent soft tissue swelling overlying the nose.

CT CERVICAL SPINE FINDINGS

Alignment: Trace C2-C3 and C3-C4 retrolisthesis. Trace C4-C5
anterolisthesis. 3 mm C7-T1 grade 1 anterolisthesis.

Skull base and vertebrae: The basion-dental and atlanto-dental
intervals are maintained.No evidence of acute fracture to the
cervical spine.

Soft tissues and spinal canal: No prevertebral fluid or swelling. No
visible canal hematoma.

Disc levels: Cervical spondylosis. Most notably at C5-C6 and C6-C7
there is moderate/advanced disc space narrowing with posterior disc
osteophytes and uncovertebral and facet hypertrophy. Bilateral
neural foraminal narrowing at these levels. No high-grade bony
spinal canal stenosis.

Upper chest: No consolidation within the imaged lung apices. No
visible pneumothorax

Other: Atherosclerotic calcification within the proximal major
branch vessels of the neck, as well as within the carotid
bifurcations and proximal internal carotid arteries.
IMPRESSION: CT head:

1. No evidence of acute intracranial abnormality.
2. Moderate generalized parenchymal atrophy.

CT maxillofacial:

Comminuted and displaced right nasal bone fracture. Minimally
displaced fracture of the left nasal bone. Overlying soft tissue
swelling/hematoma.

CT cervical spine:

1. No evidence of acute fracture to the cervical spine.
2. Cervical spondylosis greatest at C5-C6 and C6-C7.
3. Multilevel spondylolisthesis, which is which is nonspecific but
may be degenerative.

## 2021-03-19 IMAGING — CT CT HEAD W/O CM
4 series · 15 of 47 positions shown, 17 images · non-contrast
Comparison: Head CT 04/13/2018

CLINICAL DATA: Head trauma, headache. Head trauma, headache, fall.
Additional history provided

EXAM:
CT HEAD WITHOUT CONTRAST
CT MAXILLOFACIAL WITHOUT CONTRAST
CT CERVICAL SPINE WITHOUT CONTRAST
TECHNIQUE: Multidetector CT imaging of the head, cervical spine, and
maxillofacial structures were performed using the standard protocol
without intravenous contrast. Multiplanar CT image reconstructions
of the cervical spine and maxillofacial structures were also
generated.

[Series 2: head wo · axial · 0.47mm/px · z∈[-170,-50]mm · 7 of 34 slices shown, 9 images]
[im 5/34  brain]
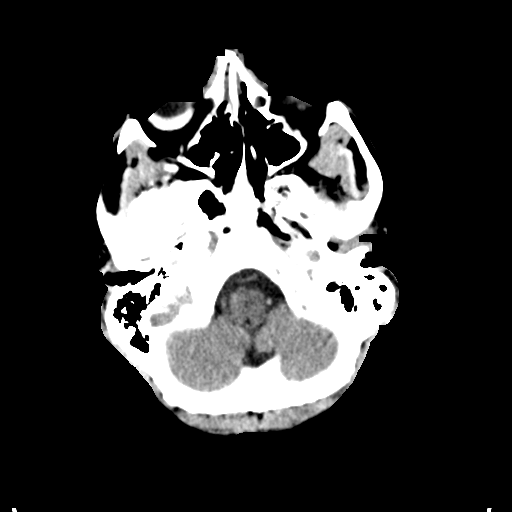
[im 5/34  bone]
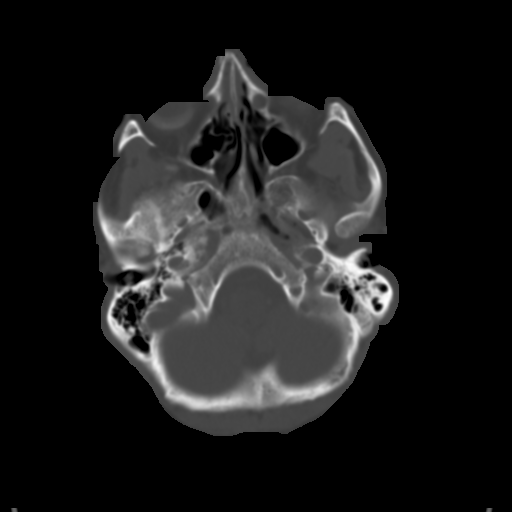
[im 9/34  brain]
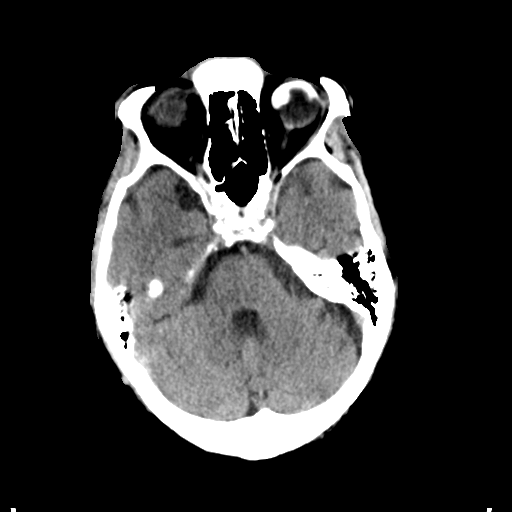
[im 13/34  brain]
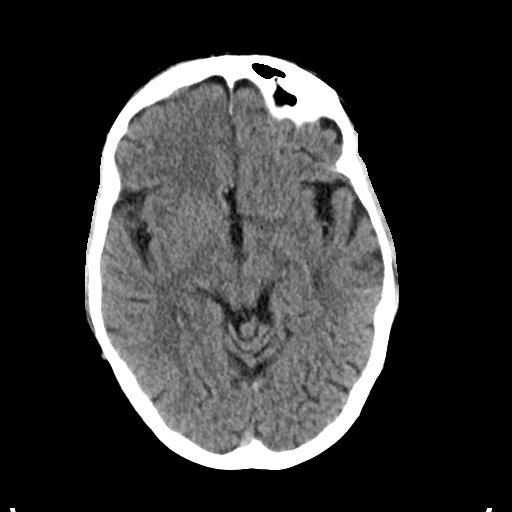
[im 17/34  brain]
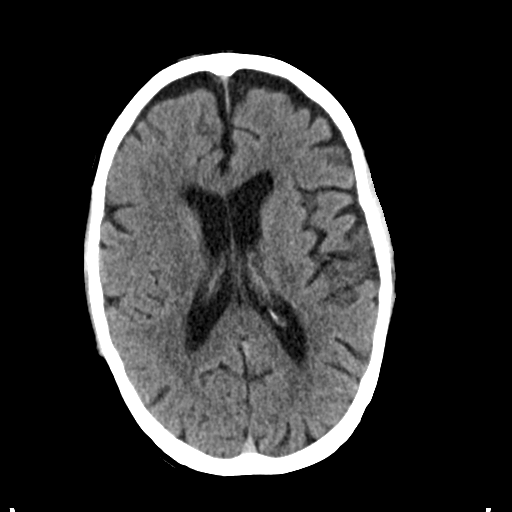
[im 21/34  brain]
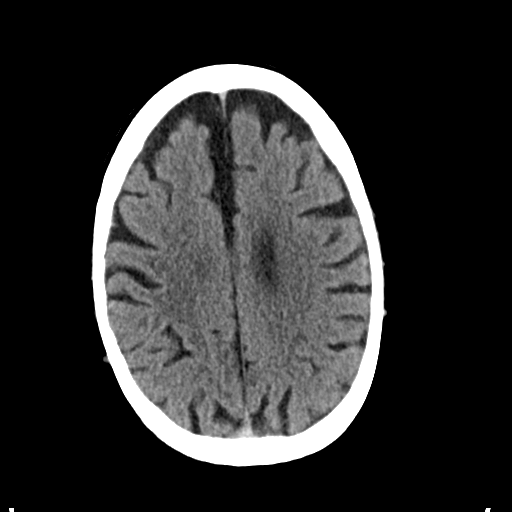
[im 21/34  bone]
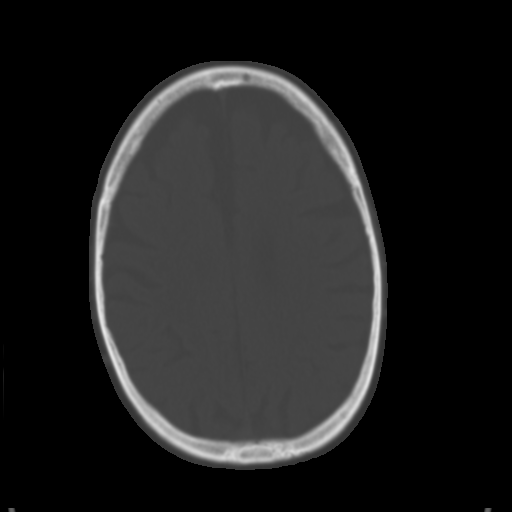
[im 25/34  brain]
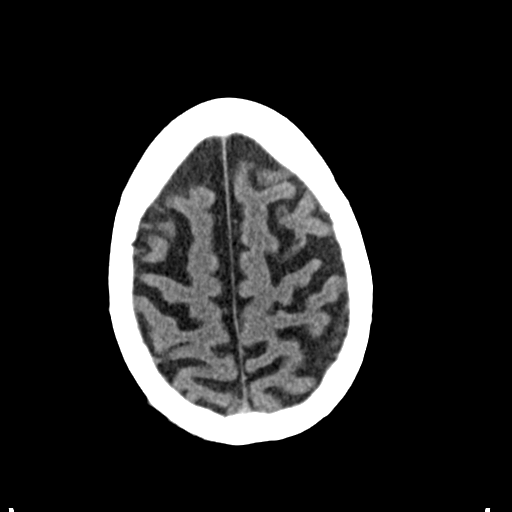
[im 29/34  brain]
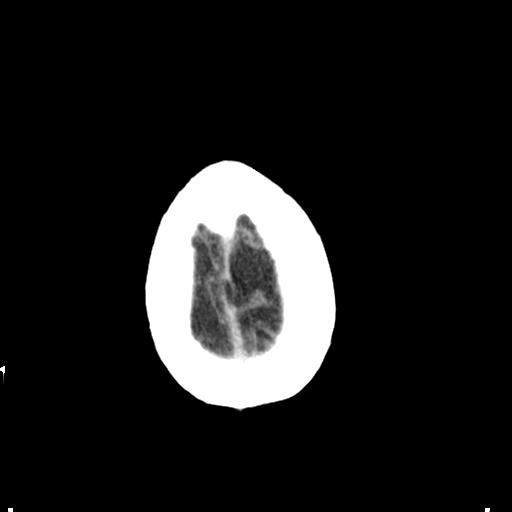

[Series 3: head bone · axial · 0.47mm/px · z∈[-174,-158]mm · 2 of 84 slices shown]
[im 9/84  bone]
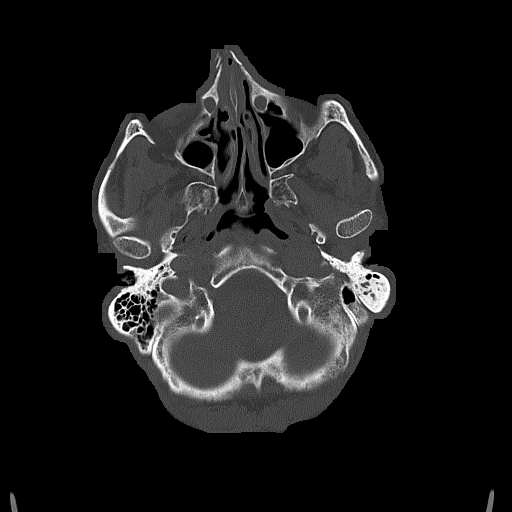
[im 17/84  bone]
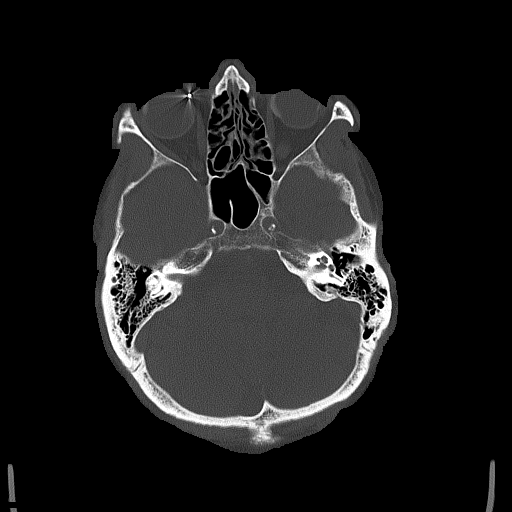

[Series 4: coronal soft tissue · coronal · 0.33mm/px · 3 of 72 slices shown]
[im 24/72  brain]
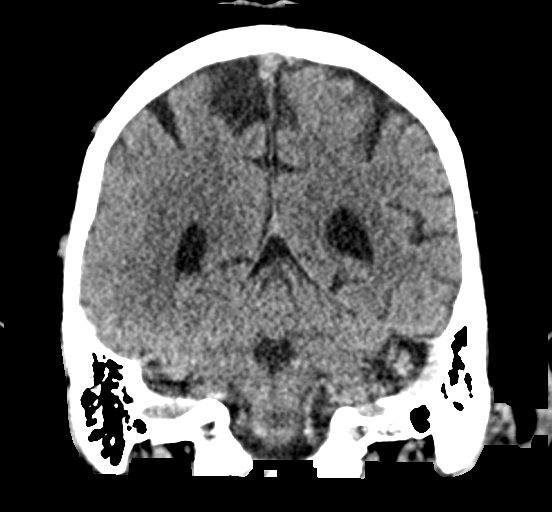
[im 32/72  brain]
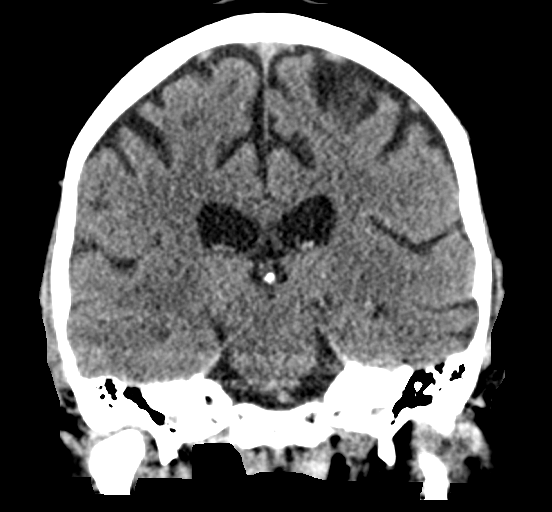
[im 40/72  brain]
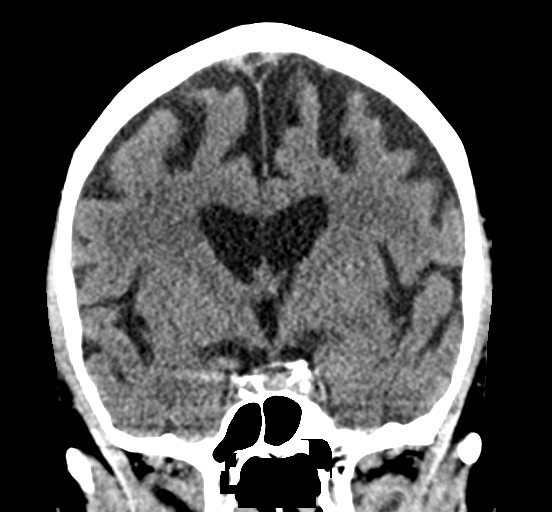

[Series 5: sagittal soft tissue · sagittal · 0.33mm/px · 3 of 60 slices shown]
[im 20/60  brain]
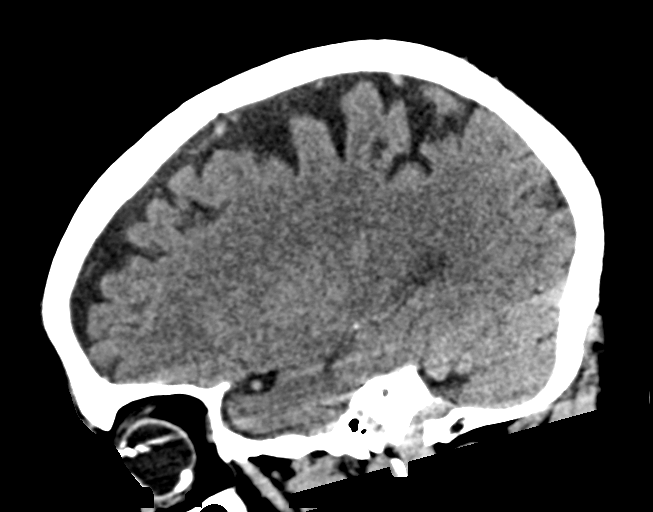
[im 30/60  brain]
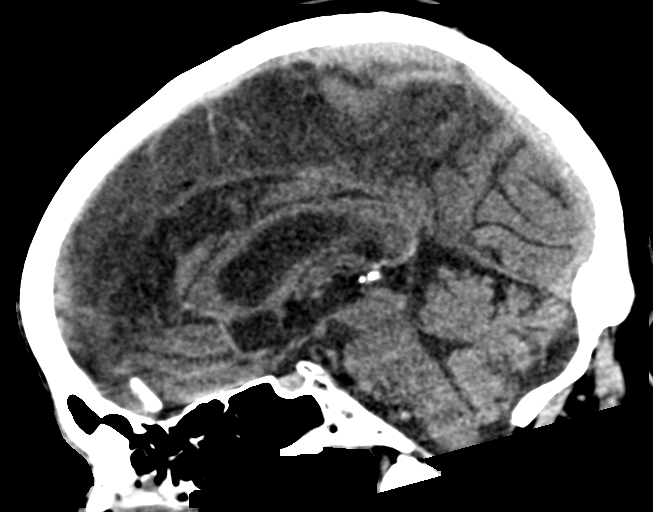
[im 40/60  brain]
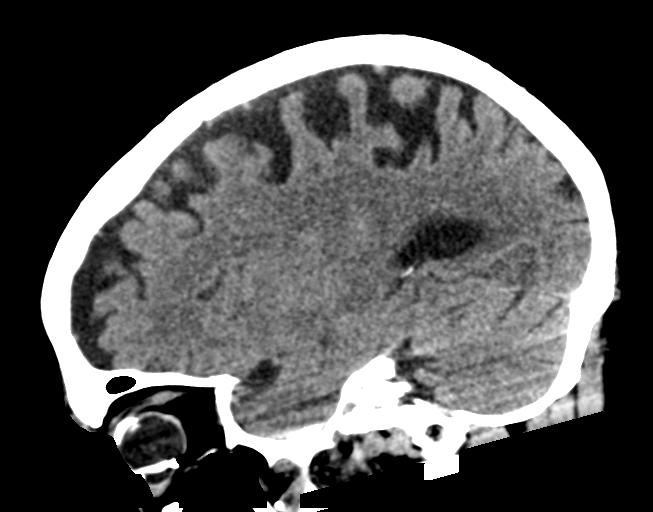

[15 of 47 positions shown; findings below may reference images not displayed]

FINDINGS: CT HEAD FINDINGS

Brain: There is no evidence of acute intracranial hemorrhage,
intracranial mass, midline shift or extra-axial fluid collection.No
demarcated cortical infarction. Moderate generalized parenchymal
atrophy.

Vascular: No hyperdense vessel.  Atherosclerotic calcifications.

Skull: Normal. Negative for fracture or focal lesion.

CT MAXILLOFACIAL FINDINGS

Osseous: There is a comminuted and displaced fracture of the right
nasal bone. There is also a minimally displaced fracture of the left
nasal bone (series 6, image 16). No definite fracture to the nasal
septum is identified.

Orbits: No evidence of acute abnormality. Bilateral scleral buckles
redemonstrated. Metallic foreign bodies within both orbits which may
be related to the scleral buckles or may reflect glaucoma valve.

Sinuses: Mild scattered paranasal sinus mucosal thickening.
Secretions within the nasal cavity which may reflect blood products.

Soft tissues: Prominent soft tissue swelling overlying the nose.

CT CERVICAL SPINE FINDINGS

Alignment: Trace C2-C3 and C3-C4 retrolisthesis. Trace C4-C5
anterolisthesis. 3 mm C7-T1 grade 1 anterolisthesis.

Skull base and vertebrae: The basion-dental and atlanto-dental
intervals are maintained.No evidence of acute fracture to the
cervical spine.

Soft tissues and spinal canal: No prevertebral fluid or swelling. No
visible canal hematoma.

Disc levels: Cervical spondylosis. Most notably at C5-C6 and C6-C7
there is moderate/advanced disc space narrowing with posterior disc
osteophytes and uncovertebral and facet hypertrophy. Bilateral
neural foraminal narrowing at these levels. No high-grade bony
spinal canal stenosis.

Upper chest: No consolidation within the imaged lung apices. No
visible pneumothorax

Other: Atherosclerotic calcification within the proximal major
branch vessels of the neck, as well as within the carotid
bifurcations and proximal internal carotid arteries.
IMPRESSION: CT head:

1. No evidence of acute intracranial abnormality.
2. Moderate generalized parenchymal atrophy.

CT maxillofacial:

Comminuted and displaced right nasal bone fracture. Minimally
displaced fracture of the left nasal bone. Overlying soft tissue
swelling/hematoma.

CT cervical spine:

1. No evidence of acute fracture to the cervical spine.
2. Cervical spondylosis greatest at C5-C6 and C6-C7.
3. Multilevel spondylolisthesis, which is which is nonspecific but
may be degenerative.

## 2021-03-19 IMAGING — CT CT MAXILLOFACIAL W/O CM
3 of 4 series · 13 of 33 positions shown, 15 images · non-contrast
Comparison: Head CT 04/13/2018

CLINICAL DATA: Head trauma, headache. Head trauma, headache, fall.
Additional history provided

EXAM:
CT HEAD WITHOUT CONTRAST
CT MAXILLOFACIAL WITHOUT CONTRAST
CT CERVICAL SPINE WITHOUT CONTRAST
TECHNIQUE: Multidetector CT imaging of the head, cervical spine, and
maxillofacial structures were performed using the standard protocol
without intravenous contrast. Multiplanar CT image reconstructions
of the cervical spine and maxillofacial structures were also
generated.

[Series 6: coronal bone · coronal · 0.37mm/px · 3 of 112 slices shown]
[im 23/112  bone]
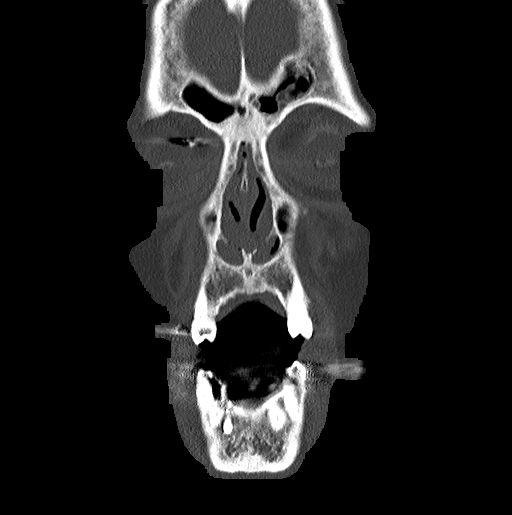
[im 45/112  bone]
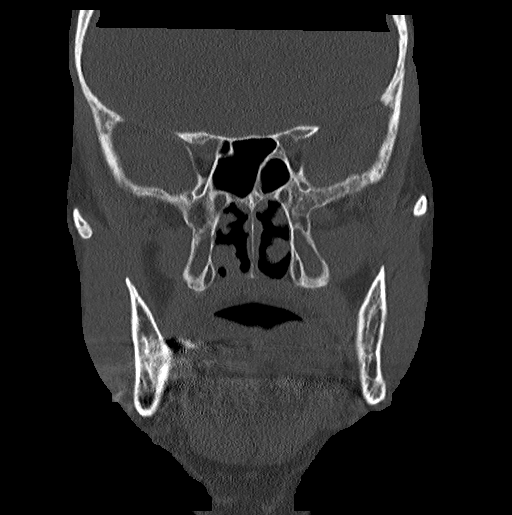
[im 67/112  bone]
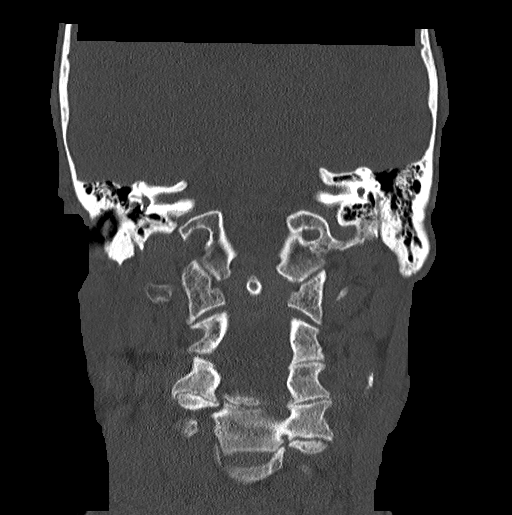

[Series 12: c spine soft · axial · 0.38mm/px · z∈[-328,-192]mm · 8 of 82 slices shown, 10 images]
[im 7/82  soft-tissue]
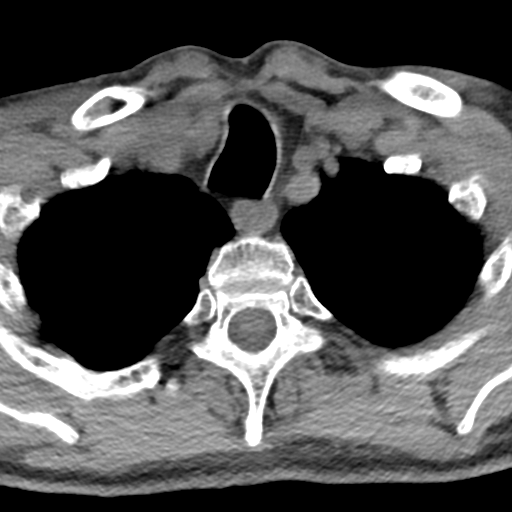
[im 7/82  bone]
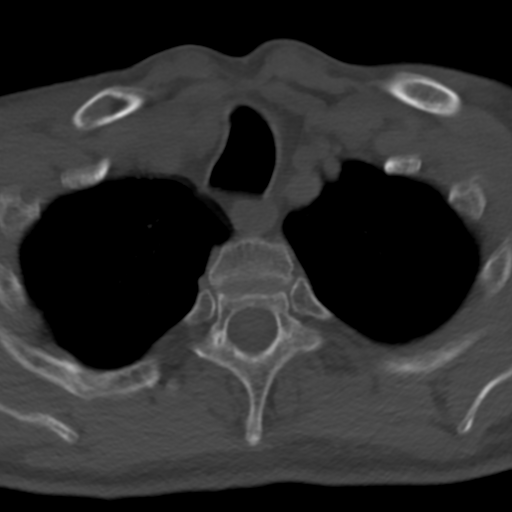
[im 19/82  bone]
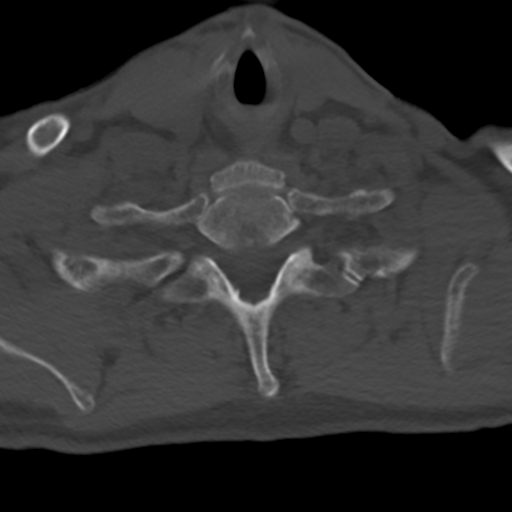
[im 25/82  bone]
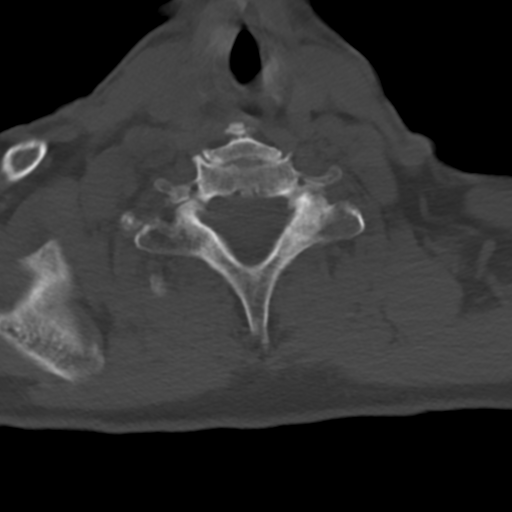
[im 38/82  bone]
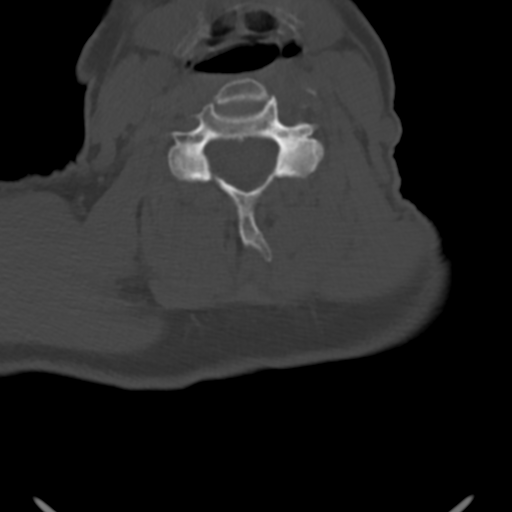
[im 44/82  soft-tissue]
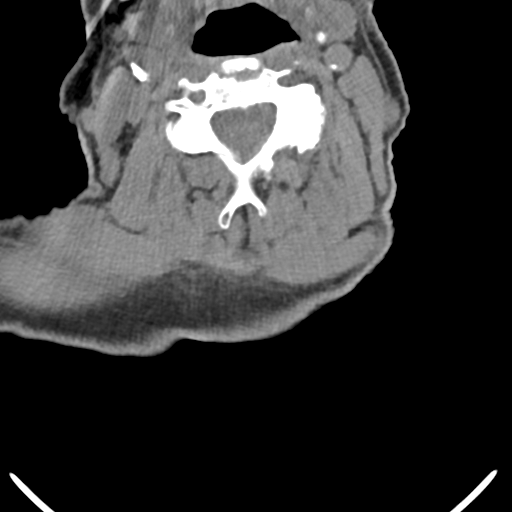
[im 44/82  bone]
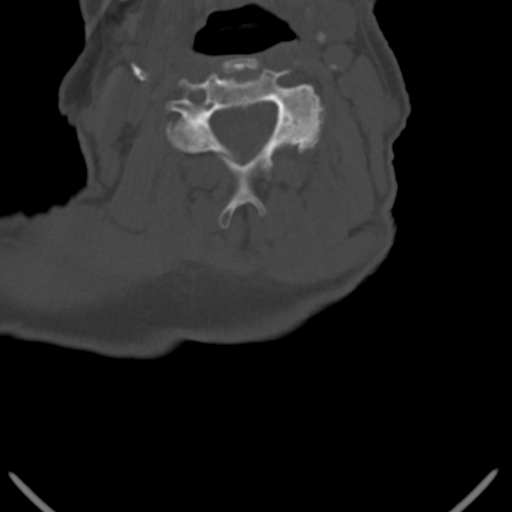
[im 57/82  bone]
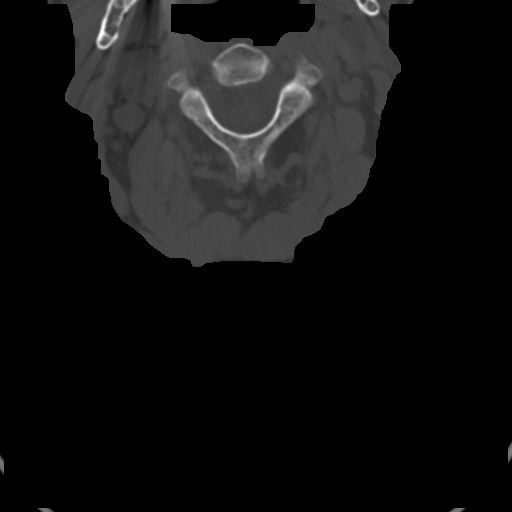
[im 63/82  bone]
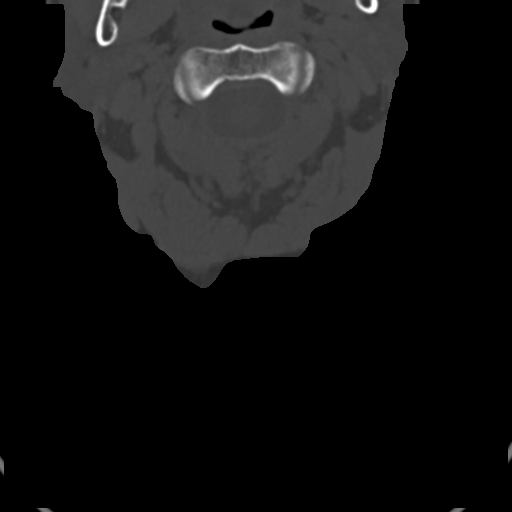
[im 75/82  bone]
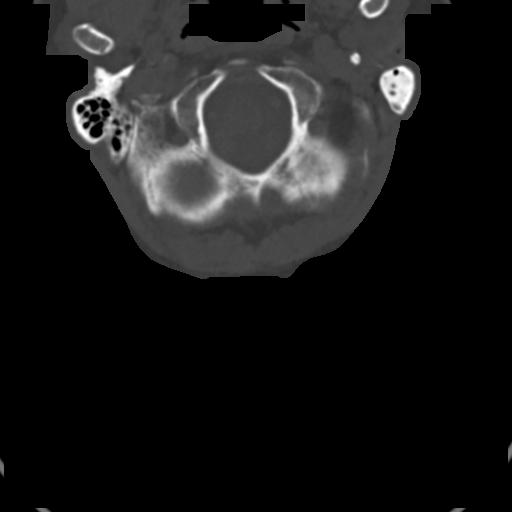

[Series 15: sagittal bone · sagittal · 0.26mm/px · 2 of 50 slices shown]
[im 17/50  bone]
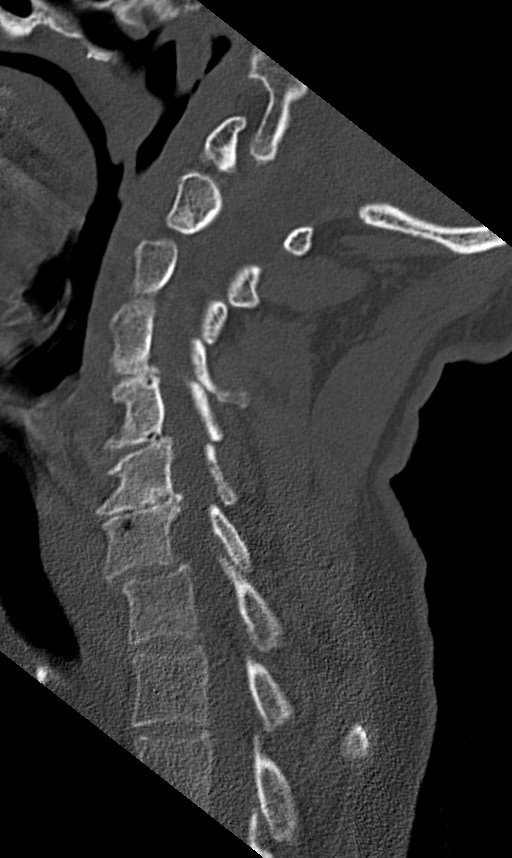
[im 33/50  bone]
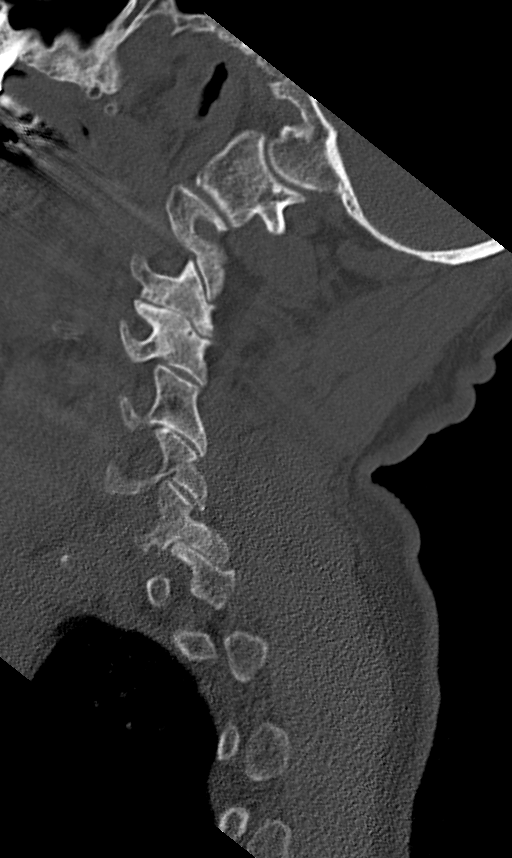

[13 of 33 positions shown; findings below may reference images not displayed]

FINDINGS: CT HEAD FINDINGS

Brain: There is no evidence of acute intracranial hemorrhage,
intracranial mass, midline shift or extra-axial fluid collection.No
demarcated cortical infarction. Moderate generalized parenchymal
atrophy.

Vascular: No hyperdense vessel.  Atherosclerotic calcifications.

Skull: Normal. Negative for fracture or focal lesion.

CT MAXILLOFACIAL FINDINGS

Osseous: There is a comminuted and displaced fracture of the right
nasal bone. There is also a minimally displaced fracture of the left
nasal bone (series 6, image 16). No definite fracture to the nasal
septum is identified.

Orbits: No evidence of acute abnormality. Bilateral scleral buckles
redemonstrated. Metallic foreign bodies within both orbits which may
be related to the scleral buckles or may reflect glaucoma valve.

Sinuses: Mild scattered paranasal sinus mucosal thickening.
Secretions within the nasal cavity which may reflect blood products.

Soft tissues: Prominent soft tissue swelling overlying the nose.

CT CERVICAL SPINE FINDINGS

Alignment: Trace C2-C3 and C3-C4 retrolisthesis. Trace C4-C5
anterolisthesis. 3 mm C7-T1 grade 1 anterolisthesis.

Skull base and vertebrae: The basion-dental and atlanto-dental
intervals are maintained.No evidence of acute fracture to the
cervical spine.

Soft tissues and spinal canal: No prevertebral fluid or swelling. No
visible canal hematoma.

Disc levels: Cervical spondylosis. Most notably at C5-C6 and C6-C7
there is moderate/advanced disc space narrowing with posterior disc
osteophytes and uncovertebral and facet hypertrophy. Bilateral
neural foraminal narrowing at these levels. No high-grade bony
spinal canal stenosis.

Upper chest: No consolidation within the imaged lung apices. No
visible pneumothorax

Other: Atherosclerotic calcification within the proximal major
branch vessels of the neck, as well as within the carotid
bifurcations and proximal internal carotid arteries.
IMPRESSION: CT head:

1. No evidence of acute intracranial abnormality.
2. Moderate generalized parenchymal atrophy.

CT maxillofacial:

Comminuted and displaced right nasal bone fracture. Minimally
displaced fracture of the left nasal bone. Overlying soft tissue
swelling/hematoma.

CT cervical spine:

1. No evidence of acute fracture to the cervical spine.
2. Cervical spondylosis greatest at C5-C6 and C6-C7.
3. Multilevel spondylolisthesis, which is which is nonspecific but
may be degenerative.

## 2021-03-20 DIAGNOSIS — R482 Apraxia: Secondary | ICD-10-CM | POA: Diagnosis not present

## 2021-03-20 DIAGNOSIS — F330 Major depressive disorder, recurrent, mild: Secondary | ICD-10-CM | POA: Diagnosis not present

## 2021-03-20 DIAGNOSIS — I119 Hypertensive heart disease without heart failure: Secondary | ICD-10-CM | POA: Diagnosis not present

## 2021-03-20 DIAGNOSIS — E890 Postprocedural hypothyroidism: Secondary | ICD-10-CM | POA: Diagnosis not present

## 2021-03-20 DIAGNOSIS — C73 Malignant neoplasm of thyroid gland: Secondary | ICD-10-CM | POA: Diagnosis not present

## 2021-03-20 DIAGNOSIS — G3185 Corticobasal degeneration: Secondary | ICD-10-CM | POA: Diagnosis not present

## 2021-03-23 DIAGNOSIS — R482 Apraxia: Secondary | ICD-10-CM | POA: Diagnosis not present

## 2021-03-23 DIAGNOSIS — F330 Major depressive disorder, recurrent, mild: Secondary | ICD-10-CM | POA: Diagnosis not present

## 2021-03-23 DIAGNOSIS — E890 Postprocedural hypothyroidism: Secondary | ICD-10-CM | POA: Diagnosis not present

## 2021-03-23 DIAGNOSIS — I119 Hypertensive heart disease without heart failure: Secondary | ICD-10-CM | POA: Diagnosis not present

## 2021-03-23 DIAGNOSIS — C73 Malignant neoplasm of thyroid gland: Secondary | ICD-10-CM | POA: Diagnosis not present

## 2021-03-23 DIAGNOSIS — G3185 Corticobasal degeneration: Secondary | ICD-10-CM | POA: Diagnosis not present

## 2021-03-24 DIAGNOSIS — E890 Postprocedural hypothyroidism: Secondary | ICD-10-CM | POA: Diagnosis not present

## 2021-03-24 DIAGNOSIS — I119 Hypertensive heart disease without heart failure: Secondary | ICD-10-CM | POA: Diagnosis not present

## 2021-03-24 DIAGNOSIS — F330 Major depressive disorder, recurrent, mild: Secondary | ICD-10-CM | POA: Diagnosis not present

## 2021-03-24 DIAGNOSIS — G3185 Corticobasal degeneration: Secondary | ICD-10-CM | POA: Diagnosis not present

## 2021-03-24 DIAGNOSIS — C73 Malignant neoplasm of thyroid gland: Secondary | ICD-10-CM | POA: Diagnosis not present

## 2021-03-24 DIAGNOSIS — R482 Apraxia: Secondary | ICD-10-CM | POA: Diagnosis not present

## 2021-03-26 DIAGNOSIS — F330 Major depressive disorder, recurrent, mild: Secondary | ICD-10-CM | POA: Diagnosis not present

## 2021-03-26 DIAGNOSIS — I700 Atherosclerosis of aorta: Secondary | ICD-10-CM | POA: Diagnosis not present

## 2021-03-26 DIAGNOSIS — I119 Hypertensive heart disease without heart failure: Secondary | ICD-10-CM | POA: Diagnosis not present

## 2021-03-26 DIAGNOSIS — R482 Apraxia: Secondary | ICD-10-CM | POA: Diagnosis not present

## 2021-03-26 DIAGNOSIS — E785 Hyperlipidemia, unspecified: Secondary | ICD-10-CM | POA: Diagnosis not present

## 2021-03-26 DIAGNOSIS — C73 Malignant neoplasm of thyroid gland: Secondary | ICD-10-CM | POA: Diagnosis not present

## 2021-03-26 DIAGNOSIS — E890 Postprocedural hypothyroidism: Secondary | ICD-10-CM | POA: Diagnosis not present

## 2021-03-26 DIAGNOSIS — G3185 Corticobasal degeneration: Secondary | ICD-10-CM | POA: Diagnosis not present

## 2021-03-27 DIAGNOSIS — F330 Major depressive disorder, recurrent, mild: Secondary | ICD-10-CM | POA: Diagnosis not present

## 2021-03-27 DIAGNOSIS — I119 Hypertensive heart disease without heart failure: Secondary | ICD-10-CM | POA: Diagnosis not present

## 2021-03-27 DIAGNOSIS — C73 Malignant neoplasm of thyroid gland: Secondary | ICD-10-CM | POA: Diagnosis not present

## 2021-03-27 DIAGNOSIS — R482 Apraxia: Secondary | ICD-10-CM | POA: Diagnosis not present

## 2021-03-27 DIAGNOSIS — G3185 Corticobasal degeneration: Secondary | ICD-10-CM | POA: Diagnosis not present

## 2021-03-27 DIAGNOSIS — E890 Postprocedural hypothyroidism: Secondary | ICD-10-CM | POA: Diagnosis not present

## 2021-03-30 DIAGNOSIS — G3185 Corticobasal degeneration: Secondary | ICD-10-CM | POA: Diagnosis not present

## 2021-03-30 DIAGNOSIS — I119 Hypertensive heart disease without heart failure: Secondary | ICD-10-CM | POA: Diagnosis not present

## 2021-03-30 DIAGNOSIS — C73 Malignant neoplasm of thyroid gland: Secondary | ICD-10-CM | POA: Diagnosis not present

## 2021-03-30 DIAGNOSIS — E890 Postprocedural hypothyroidism: Secondary | ICD-10-CM | POA: Diagnosis not present

## 2021-03-30 DIAGNOSIS — R482 Apraxia: Secondary | ICD-10-CM | POA: Diagnosis not present

## 2021-03-30 DIAGNOSIS — F330 Major depressive disorder, recurrent, mild: Secondary | ICD-10-CM | POA: Diagnosis not present

## 2021-03-31 DIAGNOSIS — E890 Postprocedural hypothyroidism: Secondary | ICD-10-CM | POA: Diagnosis not present

## 2021-03-31 DIAGNOSIS — I119 Hypertensive heart disease without heart failure: Secondary | ICD-10-CM | POA: Diagnosis not present

## 2021-03-31 DIAGNOSIS — F330 Major depressive disorder, recurrent, mild: Secondary | ICD-10-CM | POA: Diagnosis not present

## 2021-03-31 DIAGNOSIS — C73 Malignant neoplasm of thyroid gland: Secondary | ICD-10-CM | POA: Diagnosis not present

## 2021-03-31 DIAGNOSIS — R482 Apraxia: Secondary | ICD-10-CM | POA: Diagnosis not present

## 2021-03-31 DIAGNOSIS — G3185 Corticobasal degeneration: Secondary | ICD-10-CM | POA: Diagnosis not present

## 2021-04-01 ENCOUNTER — Ambulatory Visit: Payer: Medicare Other

## 2021-04-02 DIAGNOSIS — C73 Malignant neoplasm of thyroid gland: Secondary | ICD-10-CM | POA: Diagnosis not present

## 2021-04-02 DIAGNOSIS — G3185 Corticobasal degeneration: Secondary | ICD-10-CM | POA: Diagnosis not present

## 2021-04-02 DIAGNOSIS — R482 Apraxia: Secondary | ICD-10-CM | POA: Diagnosis not present

## 2021-04-02 DIAGNOSIS — F330 Major depressive disorder, recurrent, mild: Secondary | ICD-10-CM | POA: Diagnosis not present

## 2021-04-02 DIAGNOSIS — E890 Postprocedural hypothyroidism: Secondary | ICD-10-CM | POA: Diagnosis not present

## 2021-04-02 DIAGNOSIS — I119 Hypertensive heart disease without heart failure: Secondary | ICD-10-CM | POA: Diagnosis not present

## 2021-04-03 DIAGNOSIS — F330 Major depressive disorder, recurrent, mild: Secondary | ICD-10-CM | POA: Diagnosis not present

## 2021-04-03 DIAGNOSIS — E890 Postprocedural hypothyroidism: Secondary | ICD-10-CM | POA: Diagnosis not present

## 2021-04-03 DIAGNOSIS — C73 Malignant neoplasm of thyroid gland: Secondary | ICD-10-CM | POA: Diagnosis not present

## 2021-04-03 DIAGNOSIS — G3185 Corticobasal degeneration: Secondary | ICD-10-CM | POA: Diagnosis not present

## 2021-04-03 DIAGNOSIS — I119 Hypertensive heart disease without heart failure: Secondary | ICD-10-CM | POA: Diagnosis not present

## 2021-04-03 DIAGNOSIS — R482 Apraxia: Secondary | ICD-10-CM | POA: Diagnosis not present

## 2021-04-06 DIAGNOSIS — R482 Apraxia: Secondary | ICD-10-CM | POA: Diagnosis not present

## 2021-04-06 DIAGNOSIS — I119 Hypertensive heart disease without heart failure: Secondary | ICD-10-CM | POA: Diagnosis not present

## 2021-04-06 DIAGNOSIS — C73 Malignant neoplasm of thyroid gland: Secondary | ICD-10-CM | POA: Diagnosis not present

## 2021-04-06 DIAGNOSIS — F330 Major depressive disorder, recurrent, mild: Secondary | ICD-10-CM | POA: Diagnosis not present

## 2021-04-06 DIAGNOSIS — E890 Postprocedural hypothyroidism: Secondary | ICD-10-CM | POA: Diagnosis not present

## 2021-04-06 DIAGNOSIS — G3185 Corticobasal degeneration: Secondary | ICD-10-CM | POA: Diagnosis not present

## 2021-04-07 DIAGNOSIS — C73 Malignant neoplasm of thyroid gland: Secondary | ICD-10-CM | POA: Diagnosis not present

## 2021-04-07 DIAGNOSIS — F330 Major depressive disorder, recurrent, mild: Secondary | ICD-10-CM | POA: Diagnosis not present

## 2021-04-07 DIAGNOSIS — I119 Hypertensive heart disease without heart failure: Secondary | ICD-10-CM | POA: Diagnosis not present

## 2021-04-07 DIAGNOSIS — R482 Apraxia: Secondary | ICD-10-CM | POA: Diagnosis not present

## 2021-04-07 DIAGNOSIS — G3185 Corticobasal degeneration: Secondary | ICD-10-CM | POA: Diagnosis not present

## 2021-04-07 DIAGNOSIS — E890 Postprocedural hypothyroidism: Secondary | ICD-10-CM | POA: Diagnosis not present

## 2021-04-09 DIAGNOSIS — E890 Postprocedural hypothyroidism: Secondary | ICD-10-CM | POA: Diagnosis not present

## 2021-04-09 DIAGNOSIS — F330 Major depressive disorder, recurrent, mild: Secondary | ICD-10-CM | POA: Diagnosis not present

## 2021-04-09 DIAGNOSIS — I119 Hypertensive heart disease without heart failure: Secondary | ICD-10-CM | POA: Diagnosis not present

## 2021-04-09 DIAGNOSIS — G3185 Corticobasal degeneration: Secondary | ICD-10-CM | POA: Diagnosis not present

## 2021-04-09 DIAGNOSIS — C73 Malignant neoplasm of thyroid gland: Secondary | ICD-10-CM | POA: Diagnosis not present

## 2021-04-09 DIAGNOSIS — R482 Apraxia: Secondary | ICD-10-CM | POA: Diagnosis not present

## 2021-04-10 DIAGNOSIS — G3185 Corticobasal degeneration: Secondary | ICD-10-CM | POA: Diagnosis not present

## 2021-04-10 DIAGNOSIS — F330 Major depressive disorder, recurrent, mild: Secondary | ICD-10-CM | POA: Diagnosis not present

## 2021-04-10 DIAGNOSIS — I119 Hypertensive heart disease without heart failure: Secondary | ICD-10-CM | POA: Diagnosis not present

## 2021-04-10 DIAGNOSIS — R482 Apraxia: Secondary | ICD-10-CM | POA: Diagnosis not present

## 2021-04-10 DIAGNOSIS — C73 Malignant neoplasm of thyroid gland: Secondary | ICD-10-CM | POA: Diagnosis not present

## 2021-04-10 DIAGNOSIS — E890 Postprocedural hypothyroidism: Secondary | ICD-10-CM | POA: Diagnosis not present

## 2021-04-13 DIAGNOSIS — R482 Apraxia: Secondary | ICD-10-CM | POA: Diagnosis not present

## 2021-04-13 DIAGNOSIS — I119 Hypertensive heart disease without heart failure: Secondary | ICD-10-CM | POA: Diagnosis not present

## 2021-04-13 DIAGNOSIS — C73 Malignant neoplasm of thyroid gland: Secondary | ICD-10-CM | POA: Diagnosis not present

## 2021-04-13 DIAGNOSIS — G3185 Corticobasal degeneration: Secondary | ICD-10-CM | POA: Diagnosis not present

## 2021-04-13 DIAGNOSIS — F330 Major depressive disorder, recurrent, mild: Secondary | ICD-10-CM | POA: Diagnosis not present

## 2021-04-13 DIAGNOSIS — E890 Postprocedural hypothyroidism: Secondary | ICD-10-CM | POA: Diagnosis not present

## 2021-04-15 DIAGNOSIS — E890 Postprocedural hypothyroidism: Secondary | ICD-10-CM | POA: Diagnosis not present

## 2021-04-15 DIAGNOSIS — F330 Major depressive disorder, recurrent, mild: Secondary | ICD-10-CM | POA: Diagnosis not present

## 2021-04-15 DIAGNOSIS — C73 Malignant neoplasm of thyroid gland: Secondary | ICD-10-CM | POA: Diagnosis not present

## 2021-04-15 DIAGNOSIS — I119 Hypertensive heart disease without heart failure: Secondary | ICD-10-CM | POA: Diagnosis not present

## 2021-04-15 DIAGNOSIS — R482 Apraxia: Secondary | ICD-10-CM | POA: Diagnosis not present

## 2021-04-15 DIAGNOSIS — G3185 Corticobasal degeneration: Secondary | ICD-10-CM | POA: Diagnosis not present

## 2021-04-17 DIAGNOSIS — E890 Postprocedural hypothyroidism: Secondary | ICD-10-CM | POA: Diagnosis not present

## 2021-04-17 DIAGNOSIS — R482 Apraxia: Secondary | ICD-10-CM | POA: Diagnosis not present

## 2021-04-17 DIAGNOSIS — I119 Hypertensive heart disease without heart failure: Secondary | ICD-10-CM | POA: Diagnosis not present

## 2021-04-17 DIAGNOSIS — C73 Malignant neoplasm of thyroid gland: Secondary | ICD-10-CM | POA: Diagnosis not present

## 2021-04-17 DIAGNOSIS — F330 Major depressive disorder, recurrent, mild: Secondary | ICD-10-CM | POA: Diagnosis not present

## 2021-04-17 DIAGNOSIS — G3185 Corticobasal degeneration: Secondary | ICD-10-CM | POA: Diagnosis not present

## 2021-04-21 DIAGNOSIS — R482 Apraxia: Secondary | ICD-10-CM | POA: Diagnosis not present

## 2021-04-21 DIAGNOSIS — G3185 Corticobasal degeneration: Secondary | ICD-10-CM | POA: Diagnosis not present

## 2021-04-21 DIAGNOSIS — F330 Major depressive disorder, recurrent, mild: Secondary | ICD-10-CM | POA: Diagnosis not present

## 2021-04-21 DIAGNOSIS — E890 Postprocedural hypothyroidism: Secondary | ICD-10-CM | POA: Diagnosis not present

## 2021-04-21 DIAGNOSIS — I119 Hypertensive heart disease without heart failure: Secondary | ICD-10-CM | POA: Diagnosis not present

## 2021-04-21 DIAGNOSIS — C73 Malignant neoplasm of thyroid gland: Secondary | ICD-10-CM | POA: Diagnosis not present

## 2021-04-23 DIAGNOSIS — I119 Hypertensive heart disease without heart failure: Secondary | ICD-10-CM | POA: Diagnosis not present

## 2021-04-23 DIAGNOSIS — G3185 Corticobasal degeneration: Secondary | ICD-10-CM | POA: Diagnosis not present

## 2021-04-23 DIAGNOSIS — F330 Major depressive disorder, recurrent, mild: Secondary | ICD-10-CM | POA: Diagnosis not present

## 2021-04-23 DIAGNOSIS — E890 Postprocedural hypothyroidism: Secondary | ICD-10-CM | POA: Diagnosis not present

## 2021-04-23 DIAGNOSIS — C73 Malignant neoplasm of thyroid gland: Secondary | ICD-10-CM | POA: Diagnosis not present

## 2021-04-23 DIAGNOSIS — R482 Apraxia: Secondary | ICD-10-CM | POA: Diagnosis not present

## 2021-04-24 DIAGNOSIS — C73 Malignant neoplasm of thyroid gland: Secondary | ICD-10-CM | POA: Diagnosis not present

## 2021-04-24 DIAGNOSIS — R482 Apraxia: Secondary | ICD-10-CM | POA: Diagnosis not present

## 2021-04-24 DIAGNOSIS — I119 Hypertensive heart disease without heart failure: Secondary | ICD-10-CM | POA: Diagnosis not present

## 2021-04-24 DIAGNOSIS — G3185 Corticobasal degeneration: Secondary | ICD-10-CM | POA: Diagnosis not present

## 2021-04-24 DIAGNOSIS — F330 Major depressive disorder, recurrent, mild: Secondary | ICD-10-CM | POA: Diagnosis not present

## 2021-04-24 DIAGNOSIS — E890 Postprocedural hypothyroidism: Secondary | ICD-10-CM | POA: Diagnosis not present

## 2021-04-26 DIAGNOSIS — E785 Hyperlipidemia, unspecified: Secondary | ICD-10-CM | POA: Diagnosis not present

## 2021-04-26 DIAGNOSIS — G3185 Corticobasal degeneration: Secondary | ICD-10-CM | POA: Diagnosis not present

## 2021-04-26 DIAGNOSIS — I700 Atherosclerosis of aorta: Secondary | ICD-10-CM | POA: Diagnosis not present

## 2021-04-26 DIAGNOSIS — R482 Apraxia: Secondary | ICD-10-CM | POA: Diagnosis not present

## 2021-04-26 DIAGNOSIS — F330 Major depressive disorder, recurrent, mild: Secondary | ICD-10-CM | POA: Diagnosis not present

## 2021-04-26 DIAGNOSIS — C73 Malignant neoplasm of thyroid gland: Secondary | ICD-10-CM | POA: Diagnosis not present

## 2021-04-26 DIAGNOSIS — E890 Postprocedural hypothyroidism: Secondary | ICD-10-CM | POA: Diagnosis not present

## 2021-04-26 DIAGNOSIS — I119 Hypertensive heart disease without heart failure: Secondary | ICD-10-CM | POA: Diagnosis not present

## 2021-04-28 DIAGNOSIS — E890 Postprocedural hypothyroidism: Secondary | ICD-10-CM | POA: Diagnosis not present

## 2021-04-28 DIAGNOSIS — F330 Major depressive disorder, recurrent, mild: Secondary | ICD-10-CM | POA: Diagnosis not present

## 2021-04-28 DIAGNOSIS — G3185 Corticobasal degeneration: Secondary | ICD-10-CM | POA: Diagnosis not present

## 2021-04-28 DIAGNOSIS — I119 Hypertensive heart disease without heart failure: Secondary | ICD-10-CM | POA: Diagnosis not present

## 2021-04-28 DIAGNOSIS — R482 Apraxia: Secondary | ICD-10-CM | POA: Diagnosis not present

## 2021-04-28 DIAGNOSIS — C73 Malignant neoplasm of thyroid gland: Secondary | ICD-10-CM | POA: Diagnosis not present

## 2021-04-29 DIAGNOSIS — F330 Major depressive disorder, recurrent, mild: Secondary | ICD-10-CM | POA: Diagnosis not present

## 2021-04-29 DIAGNOSIS — R482 Apraxia: Secondary | ICD-10-CM | POA: Diagnosis not present

## 2021-04-29 DIAGNOSIS — E890 Postprocedural hypothyroidism: Secondary | ICD-10-CM | POA: Diagnosis not present

## 2021-04-29 DIAGNOSIS — I119 Hypertensive heart disease without heart failure: Secondary | ICD-10-CM | POA: Diagnosis not present

## 2021-04-29 DIAGNOSIS — C73 Malignant neoplasm of thyroid gland: Secondary | ICD-10-CM | POA: Diagnosis not present

## 2021-04-29 DIAGNOSIS — G3185 Corticobasal degeneration: Secondary | ICD-10-CM | POA: Diagnosis not present

## 2021-04-30 DIAGNOSIS — E890 Postprocedural hypothyroidism: Secondary | ICD-10-CM | POA: Diagnosis not present

## 2021-04-30 DIAGNOSIS — C73 Malignant neoplasm of thyroid gland: Secondary | ICD-10-CM | POA: Diagnosis not present

## 2021-04-30 DIAGNOSIS — G3185 Corticobasal degeneration: Secondary | ICD-10-CM | POA: Diagnosis not present

## 2021-04-30 DIAGNOSIS — F330 Major depressive disorder, recurrent, mild: Secondary | ICD-10-CM | POA: Diagnosis not present

## 2021-04-30 DIAGNOSIS — I119 Hypertensive heart disease without heart failure: Secondary | ICD-10-CM | POA: Diagnosis not present

## 2021-04-30 DIAGNOSIS — R482 Apraxia: Secondary | ICD-10-CM | POA: Diagnosis not present

## 2021-05-01 DIAGNOSIS — F330 Major depressive disorder, recurrent, mild: Secondary | ICD-10-CM | POA: Diagnosis not present

## 2021-05-01 DIAGNOSIS — C73 Malignant neoplasm of thyroid gland: Secondary | ICD-10-CM | POA: Diagnosis not present

## 2021-05-01 DIAGNOSIS — G3185 Corticobasal degeneration: Secondary | ICD-10-CM | POA: Diagnosis not present

## 2021-05-01 DIAGNOSIS — R482 Apraxia: Secondary | ICD-10-CM | POA: Diagnosis not present

## 2021-05-01 DIAGNOSIS — I119 Hypertensive heart disease without heart failure: Secondary | ICD-10-CM | POA: Diagnosis not present

## 2021-05-01 DIAGNOSIS — E890 Postprocedural hypothyroidism: Secondary | ICD-10-CM | POA: Diagnosis not present

## 2021-05-03 ENCOUNTER — Other Ambulatory Visit: Payer: Self-pay | Admitting: Family Medicine

## 2021-05-03 DIAGNOSIS — I10 Essential (primary) hypertension: Secondary | ICD-10-CM

## 2021-05-03 NOTE — Telephone Encounter (Signed)
Requested Prescriptions  Pending Prescriptions Disp Refills   metoprolol succinate (TOPROL-XL) 100 MG 24 hr tablet [Pharmacy Med Name: METOPROLOL SUCC ER 100 MG TAB] 90 tablet 0    Sig: TAKE ONE TABLET WITH OR IMMEDIATELY FOLLOWING A MEAL.     Cardiovascular:  Beta Blockers Passed - 05/03/2021  2:30 PM      Passed - Last BP in normal range    BP Readings from Last 1 Encounters:  12/15/20 102/60         Passed - Last Heart Rate in normal range    Pulse Readings from Last 1 Encounters:  12/15/20 60         Passed - Valid encounter within last 6 months    Recent Outpatient Visits          4 months ago Essential hypertension   [place], MD   5 months ago Bronchiolitis   [facility] Juline Patch, MD   7 months ago Hospital discharge follow-up   [place], MD   7 months ago Somnolence   [facility] Juline Patch, MD   1 year ago Essential hypertension   [place], MD      Future Appointments            In 1 month Juline Patch, MD [facility], [facility]

## 2021-05-04 DIAGNOSIS — E890 Postprocedural hypothyroidism: Secondary | ICD-10-CM | POA: Diagnosis not present

## 2021-05-04 DIAGNOSIS — F330 Major depressive disorder, recurrent, mild: Secondary | ICD-10-CM | POA: Diagnosis not present

## 2021-05-04 DIAGNOSIS — C73 Malignant neoplasm of thyroid gland: Secondary | ICD-10-CM | POA: Diagnosis not present

## 2021-05-04 DIAGNOSIS — I119 Hypertensive heart disease without heart failure: Secondary | ICD-10-CM | POA: Diagnosis not present

## 2021-05-04 DIAGNOSIS — G3185 Corticobasal degeneration: Secondary | ICD-10-CM | POA: Diagnosis not present

## 2021-05-04 DIAGNOSIS — R482 Apraxia: Secondary | ICD-10-CM | POA: Diagnosis not present

## 2021-05-05 DIAGNOSIS — I119 Hypertensive heart disease without heart failure: Secondary | ICD-10-CM | POA: Diagnosis not present

## 2021-05-05 DIAGNOSIS — F330 Major depressive disorder, recurrent, mild: Secondary | ICD-10-CM | POA: Diagnosis not present

## 2021-05-05 DIAGNOSIS — G3185 Corticobasal degeneration: Secondary | ICD-10-CM | POA: Diagnosis not present

## 2021-05-05 DIAGNOSIS — R482 Apraxia: Secondary | ICD-10-CM | POA: Diagnosis not present

## 2021-05-05 DIAGNOSIS — C73 Malignant neoplasm of thyroid gland: Secondary | ICD-10-CM | POA: Diagnosis not present

## 2021-05-05 DIAGNOSIS — E890 Postprocedural hypothyroidism: Secondary | ICD-10-CM | POA: Diagnosis not present

## 2021-05-06 DIAGNOSIS — C73 Malignant neoplasm of thyroid gland: Secondary | ICD-10-CM | POA: Diagnosis not present

## 2021-05-06 DIAGNOSIS — R482 Apraxia: Secondary | ICD-10-CM | POA: Diagnosis not present

## 2021-05-06 DIAGNOSIS — I119 Hypertensive heart disease without heart failure: Secondary | ICD-10-CM | POA: Diagnosis not present

## 2021-05-06 DIAGNOSIS — E890 Postprocedural hypothyroidism: Secondary | ICD-10-CM | POA: Diagnosis not present

## 2021-05-06 DIAGNOSIS — G3185 Corticobasal degeneration: Secondary | ICD-10-CM | POA: Diagnosis not present

## 2021-05-06 DIAGNOSIS — F330 Major depressive disorder, recurrent, mild: Secondary | ICD-10-CM | POA: Diagnosis not present

## 2021-05-08 DIAGNOSIS — R482 Apraxia: Secondary | ICD-10-CM | POA: Diagnosis not present

## 2021-05-08 DIAGNOSIS — I119 Hypertensive heart disease without heart failure: Secondary | ICD-10-CM | POA: Diagnosis not present

## 2021-05-08 DIAGNOSIS — C73 Malignant neoplasm of thyroid gland: Secondary | ICD-10-CM | POA: Diagnosis not present

## 2021-05-08 DIAGNOSIS — E890 Postprocedural hypothyroidism: Secondary | ICD-10-CM | POA: Diagnosis not present

## 2021-05-08 DIAGNOSIS — G3185 Corticobasal degeneration: Secondary | ICD-10-CM | POA: Diagnosis not present

## 2021-05-08 DIAGNOSIS — F330 Major depressive disorder, recurrent, mild: Secondary | ICD-10-CM | POA: Diagnosis not present

## 2021-05-12 DIAGNOSIS — E890 Postprocedural hypothyroidism: Secondary | ICD-10-CM | POA: Diagnosis not present

## 2021-05-12 DIAGNOSIS — I119 Hypertensive heart disease without heart failure: Secondary | ICD-10-CM | POA: Diagnosis not present

## 2021-05-12 DIAGNOSIS — F330 Major depressive disorder, recurrent, mild: Secondary | ICD-10-CM | POA: Diagnosis not present

## 2021-05-12 DIAGNOSIS — R482 Apraxia: Secondary | ICD-10-CM | POA: Diagnosis not present

## 2021-05-12 DIAGNOSIS — C73 Malignant neoplasm of thyroid gland: Secondary | ICD-10-CM | POA: Diagnosis not present

## 2021-05-12 DIAGNOSIS — G3185 Corticobasal degeneration: Secondary | ICD-10-CM | POA: Diagnosis not present

## 2021-05-14 DIAGNOSIS — I119 Hypertensive heart disease without heart failure: Secondary | ICD-10-CM | POA: Diagnosis not present

## 2021-05-14 DIAGNOSIS — E890 Postprocedural hypothyroidism: Secondary | ICD-10-CM | POA: Diagnosis not present

## 2021-05-14 DIAGNOSIS — F330 Major depressive disorder, recurrent, mild: Secondary | ICD-10-CM | POA: Diagnosis not present

## 2021-05-14 DIAGNOSIS — G3185 Corticobasal degeneration: Secondary | ICD-10-CM | POA: Diagnosis not present

## 2021-05-14 DIAGNOSIS — C73 Malignant neoplasm of thyroid gland: Secondary | ICD-10-CM | POA: Diagnosis not present

## 2021-05-14 DIAGNOSIS — R482 Apraxia: Secondary | ICD-10-CM | POA: Diagnosis not present

## 2021-05-15 DIAGNOSIS — I119 Hypertensive heart disease without heart failure: Secondary | ICD-10-CM | POA: Diagnosis not present

## 2021-05-15 DIAGNOSIS — F330 Major depressive disorder, recurrent, mild: Secondary | ICD-10-CM | POA: Diagnosis not present

## 2021-05-15 DIAGNOSIS — C73 Malignant neoplasm of thyroid gland: Secondary | ICD-10-CM | POA: Diagnosis not present

## 2021-05-15 DIAGNOSIS — E890 Postprocedural hypothyroidism: Secondary | ICD-10-CM | POA: Diagnosis not present

## 2021-05-15 DIAGNOSIS — G3185 Corticobasal degeneration: Secondary | ICD-10-CM | POA: Diagnosis not present

## 2021-05-15 DIAGNOSIS — R482 Apraxia: Secondary | ICD-10-CM | POA: Diagnosis not present

## 2021-05-18 DIAGNOSIS — G3185 Corticobasal degeneration: Secondary | ICD-10-CM | POA: Diagnosis not present

## 2021-05-18 DIAGNOSIS — F330 Major depressive disorder, recurrent, mild: Secondary | ICD-10-CM | POA: Diagnosis not present

## 2021-05-18 DIAGNOSIS — I119 Hypertensive heart disease without heart failure: Secondary | ICD-10-CM | POA: Diagnosis not present

## 2021-05-18 DIAGNOSIS — R482 Apraxia: Secondary | ICD-10-CM | POA: Diagnosis not present

## 2021-05-18 DIAGNOSIS — E890 Postprocedural hypothyroidism: Secondary | ICD-10-CM | POA: Diagnosis not present

## 2021-05-18 DIAGNOSIS — C73 Malignant neoplasm of thyroid gland: Secondary | ICD-10-CM | POA: Diagnosis not present

## 2021-05-19 DIAGNOSIS — C73 Malignant neoplasm of thyroid gland: Secondary | ICD-10-CM | POA: Diagnosis not present

## 2021-05-19 DIAGNOSIS — R482 Apraxia: Secondary | ICD-10-CM | POA: Diagnosis not present

## 2021-05-19 DIAGNOSIS — G3185 Corticobasal degeneration: Secondary | ICD-10-CM | POA: Diagnosis not present

## 2021-05-19 DIAGNOSIS — F330 Major depressive disorder, recurrent, mild: Secondary | ICD-10-CM | POA: Diagnosis not present

## 2021-05-19 DIAGNOSIS — I119 Hypertensive heart disease without heart failure: Secondary | ICD-10-CM | POA: Diagnosis not present

## 2021-05-19 DIAGNOSIS — E890 Postprocedural hypothyroidism: Secondary | ICD-10-CM | POA: Diagnosis not present

## 2021-05-20 DIAGNOSIS — F330 Major depressive disorder, recurrent, mild: Secondary | ICD-10-CM | POA: Diagnosis not present

## 2021-05-20 DIAGNOSIS — E890 Postprocedural hypothyroidism: Secondary | ICD-10-CM | POA: Diagnosis not present

## 2021-05-20 DIAGNOSIS — G3185 Corticobasal degeneration: Secondary | ICD-10-CM | POA: Diagnosis not present

## 2021-05-20 DIAGNOSIS — R482 Apraxia: Secondary | ICD-10-CM | POA: Diagnosis not present

## 2021-05-20 DIAGNOSIS — C73 Malignant neoplasm of thyroid gland: Secondary | ICD-10-CM | POA: Diagnosis not present

## 2021-05-20 DIAGNOSIS — I119 Hypertensive heart disease without heart failure: Secondary | ICD-10-CM | POA: Diagnosis not present

## 2021-05-22 DIAGNOSIS — C73 Malignant neoplasm of thyroid gland: Secondary | ICD-10-CM | POA: Diagnosis not present

## 2021-05-22 DIAGNOSIS — E890 Postprocedural hypothyroidism: Secondary | ICD-10-CM | POA: Diagnosis not present

## 2021-05-22 DIAGNOSIS — I119 Hypertensive heart disease without heart failure: Secondary | ICD-10-CM | POA: Diagnosis not present

## 2021-05-22 DIAGNOSIS — R482 Apraxia: Secondary | ICD-10-CM | POA: Diagnosis not present

## 2021-05-22 DIAGNOSIS — F330 Major depressive disorder, recurrent, mild: Secondary | ICD-10-CM | POA: Diagnosis not present

## 2021-05-22 DIAGNOSIS — G3185 Corticobasal degeneration: Secondary | ICD-10-CM | POA: Diagnosis not present

## 2021-05-25 DIAGNOSIS — I119 Hypertensive heart disease without heart failure: Secondary | ICD-10-CM | POA: Diagnosis not present

## 2021-05-25 DIAGNOSIS — G3185 Corticobasal degeneration: Secondary | ICD-10-CM | POA: Diagnosis not present

## 2021-05-25 DIAGNOSIS — C73 Malignant neoplasm of thyroid gland: Secondary | ICD-10-CM | POA: Diagnosis not present

## 2021-05-25 DIAGNOSIS — F330 Major depressive disorder, recurrent, mild: Secondary | ICD-10-CM | POA: Diagnosis not present

## 2021-05-25 DIAGNOSIS — E890 Postprocedural hypothyroidism: Secondary | ICD-10-CM | POA: Diagnosis not present

## 2021-05-25 DIAGNOSIS — R482 Apraxia: Secondary | ICD-10-CM | POA: Diagnosis not present

## 2021-05-26 DIAGNOSIS — C73 Malignant neoplasm of thyroid gland: Secondary | ICD-10-CM | POA: Diagnosis not present

## 2021-05-26 DIAGNOSIS — R482 Apraxia: Secondary | ICD-10-CM | POA: Diagnosis not present

## 2021-05-26 DIAGNOSIS — G3185 Corticobasal degeneration: Secondary | ICD-10-CM | POA: Diagnosis not present

## 2021-05-26 DIAGNOSIS — E890 Postprocedural hypothyroidism: Secondary | ICD-10-CM | POA: Diagnosis not present

## 2021-05-26 DIAGNOSIS — I119 Hypertensive heart disease without heart failure: Secondary | ICD-10-CM | POA: Diagnosis not present

## 2021-05-26 DIAGNOSIS — F330 Major depressive disorder, recurrent, mild: Secondary | ICD-10-CM | POA: Diagnosis not present

## 2021-05-27 DIAGNOSIS — G3185 Corticobasal degeneration: Secondary | ICD-10-CM | POA: Diagnosis not present

## 2021-05-27 DIAGNOSIS — I119 Hypertensive heart disease without heart failure: Secondary | ICD-10-CM | POA: Diagnosis not present

## 2021-05-27 DIAGNOSIS — F330 Major depressive disorder, recurrent, mild: Secondary | ICD-10-CM | POA: Diagnosis not present

## 2021-05-27 DIAGNOSIS — E785 Hyperlipidemia, unspecified: Secondary | ICD-10-CM | POA: Diagnosis not present

## 2021-05-27 DIAGNOSIS — E890 Postprocedural hypothyroidism: Secondary | ICD-10-CM | POA: Diagnosis not present

## 2021-05-27 DIAGNOSIS — R482 Apraxia: Secondary | ICD-10-CM | POA: Diagnosis not present

## 2021-05-27 DIAGNOSIS — C73 Malignant neoplasm of thyroid gland: Secondary | ICD-10-CM | POA: Diagnosis not present

## 2021-05-27 DIAGNOSIS — I700 Atherosclerosis of aorta: Secondary | ICD-10-CM | POA: Diagnosis not present

## 2021-05-29 DIAGNOSIS — C73 Malignant neoplasm of thyroid gland: Secondary | ICD-10-CM | POA: Diagnosis not present

## 2021-05-29 DIAGNOSIS — F330 Major depressive disorder, recurrent, mild: Secondary | ICD-10-CM | POA: Diagnosis not present

## 2021-05-29 DIAGNOSIS — G3185 Corticobasal degeneration: Secondary | ICD-10-CM | POA: Diagnosis not present

## 2021-05-29 DIAGNOSIS — I119 Hypertensive heart disease without heart failure: Secondary | ICD-10-CM | POA: Diagnosis not present

## 2021-05-29 DIAGNOSIS — E890 Postprocedural hypothyroidism: Secondary | ICD-10-CM | POA: Diagnosis not present

## 2021-05-29 DIAGNOSIS — R482 Apraxia: Secondary | ICD-10-CM | POA: Diagnosis not present

## 2021-06-01 DIAGNOSIS — R482 Apraxia: Secondary | ICD-10-CM | POA: Diagnosis not present

## 2021-06-01 DIAGNOSIS — E890 Postprocedural hypothyroidism: Secondary | ICD-10-CM | POA: Diagnosis not present

## 2021-06-01 DIAGNOSIS — C73 Malignant neoplasm of thyroid gland: Secondary | ICD-10-CM | POA: Diagnosis not present

## 2021-06-01 DIAGNOSIS — G3185 Corticobasal degeneration: Secondary | ICD-10-CM | POA: Diagnosis not present

## 2021-06-01 DIAGNOSIS — I119 Hypertensive heart disease without heart failure: Secondary | ICD-10-CM | POA: Diagnosis not present

## 2021-06-01 DIAGNOSIS — F330 Major depressive disorder, recurrent, mild: Secondary | ICD-10-CM | POA: Diagnosis not present

## 2021-06-02 DIAGNOSIS — R482 Apraxia: Secondary | ICD-10-CM | POA: Diagnosis not present

## 2021-06-02 DIAGNOSIS — I119 Hypertensive heart disease without heart failure: Secondary | ICD-10-CM | POA: Diagnosis not present

## 2021-06-02 DIAGNOSIS — C73 Malignant neoplasm of thyroid gland: Secondary | ICD-10-CM | POA: Diagnosis not present

## 2021-06-02 DIAGNOSIS — G3185 Corticobasal degeneration: Secondary | ICD-10-CM | POA: Diagnosis not present

## 2021-06-02 DIAGNOSIS — F330 Major depressive disorder, recurrent, mild: Secondary | ICD-10-CM | POA: Diagnosis not present

## 2021-06-02 DIAGNOSIS — E890 Postprocedural hypothyroidism: Secondary | ICD-10-CM | POA: Diagnosis not present

## 2021-06-03 DIAGNOSIS — G3185 Corticobasal degeneration: Secondary | ICD-10-CM | POA: Diagnosis not present

## 2021-06-03 DIAGNOSIS — E890 Postprocedural hypothyroidism: Secondary | ICD-10-CM | POA: Diagnosis not present

## 2021-06-03 DIAGNOSIS — R482 Apraxia: Secondary | ICD-10-CM | POA: Diagnosis not present

## 2021-06-03 DIAGNOSIS — F330 Major depressive disorder, recurrent, mild: Secondary | ICD-10-CM | POA: Diagnosis not present

## 2021-06-03 DIAGNOSIS — C73 Malignant neoplasm of thyroid gland: Secondary | ICD-10-CM | POA: Diagnosis not present

## 2021-06-03 DIAGNOSIS — I119 Hypertensive heart disease without heart failure: Secondary | ICD-10-CM | POA: Diagnosis not present

## 2021-06-05 DIAGNOSIS — G3185 Corticobasal degeneration: Secondary | ICD-10-CM | POA: Diagnosis not present

## 2021-06-05 DIAGNOSIS — F330 Major depressive disorder, recurrent, mild: Secondary | ICD-10-CM | POA: Diagnosis not present

## 2021-06-05 DIAGNOSIS — I119 Hypertensive heart disease without heart failure: Secondary | ICD-10-CM | POA: Diagnosis not present

## 2021-06-05 DIAGNOSIS — E890 Postprocedural hypothyroidism: Secondary | ICD-10-CM | POA: Diagnosis not present

## 2021-06-05 DIAGNOSIS — C73 Malignant neoplasm of thyroid gland: Secondary | ICD-10-CM | POA: Diagnosis not present

## 2021-06-05 DIAGNOSIS — R482 Apraxia: Secondary | ICD-10-CM | POA: Diagnosis not present

## 2021-06-09 DIAGNOSIS — C73 Malignant neoplasm of thyroid gland: Secondary | ICD-10-CM | POA: Diagnosis not present

## 2021-06-09 DIAGNOSIS — R482 Apraxia: Secondary | ICD-10-CM | POA: Diagnosis not present

## 2021-06-09 DIAGNOSIS — E890 Postprocedural hypothyroidism: Secondary | ICD-10-CM | POA: Diagnosis not present

## 2021-06-09 DIAGNOSIS — G3185 Corticobasal degeneration: Secondary | ICD-10-CM | POA: Diagnosis not present

## 2021-06-09 DIAGNOSIS — I119 Hypertensive heart disease without heart failure: Secondary | ICD-10-CM | POA: Diagnosis not present

## 2021-06-09 DIAGNOSIS — F330 Major depressive disorder, recurrent, mild: Secondary | ICD-10-CM | POA: Diagnosis not present

## 2021-06-10 DIAGNOSIS — F330 Major depressive disorder, recurrent, mild: Secondary | ICD-10-CM | POA: Diagnosis not present

## 2021-06-10 DIAGNOSIS — G3185 Corticobasal degeneration: Secondary | ICD-10-CM | POA: Diagnosis not present

## 2021-06-10 DIAGNOSIS — I119 Hypertensive heart disease without heart failure: Secondary | ICD-10-CM | POA: Diagnosis not present

## 2021-06-10 DIAGNOSIS — C73 Malignant neoplasm of thyroid gland: Secondary | ICD-10-CM | POA: Diagnosis not present

## 2021-06-10 DIAGNOSIS — E890 Postprocedural hypothyroidism: Secondary | ICD-10-CM | POA: Diagnosis not present

## 2021-06-10 DIAGNOSIS — R482 Apraxia: Secondary | ICD-10-CM | POA: Diagnosis not present

## 2021-06-12 DIAGNOSIS — F330 Major depressive disorder, recurrent, mild: Secondary | ICD-10-CM | POA: Diagnosis not present

## 2021-06-12 DIAGNOSIS — G3185 Corticobasal degeneration: Secondary | ICD-10-CM | POA: Diagnosis not present

## 2021-06-12 DIAGNOSIS — I119 Hypertensive heart disease without heart failure: Secondary | ICD-10-CM | POA: Diagnosis not present

## 2021-06-12 DIAGNOSIS — E890 Postprocedural hypothyroidism: Secondary | ICD-10-CM | POA: Diagnosis not present

## 2021-06-12 DIAGNOSIS — R482 Apraxia: Secondary | ICD-10-CM | POA: Diagnosis not present

## 2021-06-12 DIAGNOSIS — C73 Malignant neoplasm of thyroid gland: Secondary | ICD-10-CM | POA: Diagnosis not present

## 2021-06-15 ENCOUNTER — Ambulatory Visit: Payer: Medicare Other | Admitting: Family Medicine

## 2021-06-15 DIAGNOSIS — R482 Apraxia: Secondary | ICD-10-CM | POA: Diagnosis not present

## 2021-06-15 DIAGNOSIS — F330 Major depressive disorder, recurrent, mild: Secondary | ICD-10-CM | POA: Diagnosis not present

## 2021-06-15 DIAGNOSIS — E890 Postprocedural hypothyroidism: Secondary | ICD-10-CM | POA: Diagnosis not present

## 2021-06-15 DIAGNOSIS — C73 Malignant neoplasm of thyroid gland: Secondary | ICD-10-CM | POA: Diagnosis not present

## 2021-06-15 DIAGNOSIS — I119 Hypertensive heart disease without heart failure: Secondary | ICD-10-CM | POA: Diagnosis not present

## 2021-06-15 DIAGNOSIS — G3185 Corticobasal degeneration: Secondary | ICD-10-CM | POA: Diagnosis not present

## 2021-06-16 DIAGNOSIS — F330 Major depressive disorder, recurrent, mild: Secondary | ICD-10-CM | POA: Diagnosis not present

## 2021-06-16 DIAGNOSIS — G3185 Corticobasal degeneration: Secondary | ICD-10-CM | POA: Diagnosis not present

## 2021-06-16 DIAGNOSIS — E890 Postprocedural hypothyroidism: Secondary | ICD-10-CM | POA: Diagnosis not present

## 2021-06-16 DIAGNOSIS — C73 Malignant neoplasm of thyroid gland: Secondary | ICD-10-CM | POA: Diagnosis not present

## 2021-06-16 DIAGNOSIS — R482 Apraxia: Secondary | ICD-10-CM | POA: Diagnosis not present

## 2021-06-16 DIAGNOSIS — I119 Hypertensive heart disease without heart failure: Secondary | ICD-10-CM | POA: Diagnosis not present

## 2021-06-18 DIAGNOSIS — C73 Malignant neoplasm of thyroid gland: Secondary | ICD-10-CM | POA: Diagnosis not present

## 2021-06-18 DIAGNOSIS — I119 Hypertensive heart disease without heart failure: Secondary | ICD-10-CM | POA: Diagnosis not present

## 2021-06-18 DIAGNOSIS — R482 Apraxia: Secondary | ICD-10-CM | POA: Diagnosis not present

## 2021-06-18 DIAGNOSIS — E890 Postprocedural hypothyroidism: Secondary | ICD-10-CM | POA: Diagnosis not present

## 2021-06-18 DIAGNOSIS — G3185 Corticobasal degeneration: Secondary | ICD-10-CM | POA: Diagnosis not present

## 2021-06-18 DIAGNOSIS — F330 Major depressive disorder, recurrent, mild: Secondary | ICD-10-CM | POA: Diagnosis not present

## 2021-06-19 ENCOUNTER — Other Ambulatory Visit: Payer: Self-pay | Admitting: Family Medicine

## 2021-06-19 DIAGNOSIS — I119 Hypertensive heart disease without heart failure: Secondary | ICD-10-CM | POA: Diagnosis not present

## 2021-06-19 DIAGNOSIS — E890 Postprocedural hypothyroidism: Secondary | ICD-10-CM | POA: Diagnosis not present

## 2021-06-19 DIAGNOSIS — G3185 Corticobasal degeneration: Secondary | ICD-10-CM | POA: Diagnosis not present

## 2021-06-19 DIAGNOSIS — R482 Apraxia: Secondary | ICD-10-CM | POA: Diagnosis not present

## 2021-06-19 DIAGNOSIS — C73 Malignant neoplasm of thyroid gland: Secondary | ICD-10-CM | POA: Diagnosis not present

## 2021-06-19 DIAGNOSIS — E785 Hyperlipidemia, unspecified: Secondary | ICD-10-CM

## 2021-06-19 DIAGNOSIS — F330 Major depressive disorder, recurrent, mild: Secondary | ICD-10-CM | POA: Diagnosis not present

## 2021-06-22 DIAGNOSIS — G3185 Corticobasal degeneration: Secondary | ICD-10-CM | POA: Diagnosis not present

## 2021-06-22 DIAGNOSIS — E890 Postprocedural hypothyroidism: Secondary | ICD-10-CM | POA: Diagnosis not present

## 2021-06-22 DIAGNOSIS — C73 Malignant neoplasm of thyroid gland: Secondary | ICD-10-CM | POA: Diagnosis not present

## 2021-06-22 DIAGNOSIS — I119 Hypertensive heart disease without heart failure: Secondary | ICD-10-CM | POA: Diagnosis not present

## 2021-06-22 DIAGNOSIS — R482 Apraxia: Secondary | ICD-10-CM | POA: Diagnosis not present

## 2021-06-22 DIAGNOSIS — F330 Major depressive disorder, recurrent, mild: Secondary | ICD-10-CM | POA: Diagnosis not present

## 2021-06-23 DIAGNOSIS — E890 Postprocedural hypothyroidism: Secondary | ICD-10-CM | POA: Diagnosis not present

## 2021-06-23 DIAGNOSIS — F330 Major depressive disorder, recurrent, mild: Secondary | ICD-10-CM | POA: Diagnosis not present

## 2021-06-23 DIAGNOSIS — G3185 Corticobasal degeneration: Secondary | ICD-10-CM | POA: Diagnosis not present

## 2021-06-23 DIAGNOSIS — R482 Apraxia: Secondary | ICD-10-CM | POA: Diagnosis not present

## 2021-06-23 DIAGNOSIS — I119 Hypertensive heart disease without heart failure: Secondary | ICD-10-CM | POA: Diagnosis not present

## 2021-06-23 DIAGNOSIS — C73 Malignant neoplasm of thyroid gland: Secondary | ICD-10-CM | POA: Diagnosis not present

## 2021-06-24 DIAGNOSIS — C73 Malignant neoplasm of thyroid gland: Secondary | ICD-10-CM | POA: Diagnosis not present

## 2021-06-24 DIAGNOSIS — F330 Major depressive disorder, recurrent, mild: Secondary | ICD-10-CM | POA: Diagnosis not present

## 2021-06-24 DIAGNOSIS — R482 Apraxia: Secondary | ICD-10-CM | POA: Diagnosis not present

## 2021-06-24 DIAGNOSIS — E785 Hyperlipidemia, unspecified: Secondary | ICD-10-CM | POA: Diagnosis not present

## 2021-06-24 DIAGNOSIS — G3185 Corticobasal degeneration: Secondary | ICD-10-CM | POA: Diagnosis not present

## 2021-06-24 DIAGNOSIS — E890 Postprocedural hypothyroidism: Secondary | ICD-10-CM | POA: Diagnosis not present

## 2021-06-24 DIAGNOSIS — I700 Atherosclerosis of aorta: Secondary | ICD-10-CM | POA: Diagnosis not present

## 2021-06-24 DIAGNOSIS — I119 Hypertensive heart disease without heart failure: Secondary | ICD-10-CM | POA: Diagnosis not present

## 2021-06-26 DIAGNOSIS — C73 Malignant neoplasm of thyroid gland: Secondary | ICD-10-CM | POA: Diagnosis not present

## 2021-06-26 DIAGNOSIS — G3185 Corticobasal degeneration: Secondary | ICD-10-CM | POA: Diagnosis not present

## 2021-06-26 DIAGNOSIS — I119 Hypertensive heart disease without heart failure: Secondary | ICD-10-CM | POA: Diagnosis not present

## 2021-06-26 DIAGNOSIS — F330 Major depressive disorder, recurrent, mild: Secondary | ICD-10-CM | POA: Diagnosis not present

## 2021-06-26 DIAGNOSIS — E890 Postprocedural hypothyroidism: Secondary | ICD-10-CM | POA: Diagnosis not present

## 2021-06-26 DIAGNOSIS — R482 Apraxia: Secondary | ICD-10-CM | POA: Diagnosis not present

## 2021-06-30 DIAGNOSIS — I119 Hypertensive heart disease without heart failure: Secondary | ICD-10-CM | POA: Diagnosis not present

## 2021-06-30 DIAGNOSIS — E890 Postprocedural hypothyroidism: Secondary | ICD-10-CM | POA: Diagnosis not present

## 2021-06-30 DIAGNOSIS — G3185 Corticobasal degeneration: Secondary | ICD-10-CM | POA: Diagnosis not present

## 2021-06-30 DIAGNOSIS — F330 Major depressive disorder, recurrent, mild: Secondary | ICD-10-CM | POA: Diagnosis not present

## 2021-06-30 DIAGNOSIS — R482 Apraxia: Secondary | ICD-10-CM | POA: Diagnosis not present

## 2021-06-30 DIAGNOSIS — C73 Malignant neoplasm of thyroid gland: Secondary | ICD-10-CM | POA: Diagnosis not present

## 2021-07-03 DIAGNOSIS — G3185 Corticobasal degeneration: Secondary | ICD-10-CM | POA: Diagnosis not present

## 2021-07-03 DIAGNOSIS — E890 Postprocedural hypothyroidism: Secondary | ICD-10-CM | POA: Diagnosis not present

## 2021-07-03 DIAGNOSIS — C73 Malignant neoplasm of thyroid gland: Secondary | ICD-10-CM | POA: Diagnosis not present

## 2021-07-03 DIAGNOSIS — F330 Major depressive disorder, recurrent, mild: Secondary | ICD-10-CM | POA: Diagnosis not present

## 2021-07-03 DIAGNOSIS — R482 Apraxia: Secondary | ICD-10-CM | POA: Diagnosis not present

## 2021-07-03 DIAGNOSIS — I119 Hypertensive heart disease without heart failure: Secondary | ICD-10-CM | POA: Diagnosis not present

## 2021-07-07 DIAGNOSIS — F330 Major depressive disorder, recurrent, mild: Secondary | ICD-10-CM | POA: Diagnosis not present

## 2021-07-07 DIAGNOSIS — R482 Apraxia: Secondary | ICD-10-CM | POA: Diagnosis not present

## 2021-07-07 DIAGNOSIS — I119 Hypertensive heart disease without heart failure: Secondary | ICD-10-CM | POA: Diagnosis not present

## 2021-07-07 DIAGNOSIS — E890 Postprocedural hypothyroidism: Secondary | ICD-10-CM | POA: Diagnosis not present

## 2021-07-07 DIAGNOSIS — G3185 Corticobasal degeneration: Secondary | ICD-10-CM | POA: Diagnosis not present

## 2021-07-07 DIAGNOSIS — C73 Malignant neoplasm of thyroid gland: Secondary | ICD-10-CM | POA: Diagnosis not present

## 2021-07-10 DIAGNOSIS — I119 Hypertensive heart disease without heart failure: Secondary | ICD-10-CM | POA: Diagnosis not present

## 2021-07-10 DIAGNOSIS — C73 Malignant neoplasm of thyroid gland: Secondary | ICD-10-CM | POA: Diagnosis not present

## 2021-07-10 DIAGNOSIS — E890 Postprocedural hypothyroidism: Secondary | ICD-10-CM | POA: Diagnosis not present

## 2021-07-10 DIAGNOSIS — F330 Major depressive disorder, recurrent, mild: Secondary | ICD-10-CM | POA: Diagnosis not present

## 2021-07-10 DIAGNOSIS — G3185 Corticobasal degeneration: Secondary | ICD-10-CM | POA: Diagnosis not present

## 2021-07-10 DIAGNOSIS — R482 Apraxia: Secondary | ICD-10-CM | POA: Diagnosis not present

## 2021-07-14 DIAGNOSIS — E890 Postprocedural hypothyroidism: Secondary | ICD-10-CM | POA: Diagnosis not present

## 2021-07-14 DIAGNOSIS — C73 Malignant neoplasm of thyroid gland: Secondary | ICD-10-CM | POA: Diagnosis not present

## 2021-07-14 DIAGNOSIS — F330 Major depressive disorder, recurrent, mild: Secondary | ICD-10-CM | POA: Diagnosis not present

## 2021-07-14 DIAGNOSIS — G3185 Corticobasal degeneration: Secondary | ICD-10-CM | POA: Diagnosis not present

## 2021-07-14 DIAGNOSIS — R482 Apraxia: Secondary | ICD-10-CM | POA: Diagnosis not present

## 2021-07-14 DIAGNOSIS — I119 Hypertensive heart disease without heart failure: Secondary | ICD-10-CM | POA: Diagnosis not present

## 2021-07-17 DIAGNOSIS — G3185 Corticobasal degeneration: Secondary | ICD-10-CM | POA: Diagnosis not present

## 2021-07-17 DIAGNOSIS — I119 Hypertensive heart disease without heart failure: Secondary | ICD-10-CM | POA: Diagnosis not present

## 2021-07-17 DIAGNOSIS — E890 Postprocedural hypothyroidism: Secondary | ICD-10-CM | POA: Diagnosis not present

## 2021-07-17 DIAGNOSIS — R482 Apraxia: Secondary | ICD-10-CM | POA: Diagnosis not present

## 2021-07-17 DIAGNOSIS — F330 Major depressive disorder, recurrent, mild: Secondary | ICD-10-CM | POA: Diagnosis not present

## 2021-07-17 DIAGNOSIS — C73 Malignant neoplasm of thyroid gland: Secondary | ICD-10-CM | POA: Diagnosis not present

## 2021-07-21 DIAGNOSIS — C73 Malignant neoplasm of thyroid gland: Secondary | ICD-10-CM | POA: Diagnosis not present

## 2021-07-21 DIAGNOSIS — F330 Major depressive disorder, recurrent, mild: Secondary | ICD-10-CM | POA: Diagnosis not present

## 2021-07-21 DIAGNOSIS — G3185 Corticobasal degeneration: Secondary | ICD-10-CM | POA: Diagnosis not present

## 2021-07-21 DIAGNOSIS — R482 Apraxia: Secondary | ICD-10-CM | POA: Diagnosis not present

## 2021-07-21 DIAGNOSIS — E890 Postprocedural hypothyroidism: Secondary | ICD-10-CM | POA: Diagnosis not present

## 2021-07-21 DIAGNOSIS — I119 Hypertensive heart disease without heart failure: Secondary | ICD-10-CM | POA: Diagnosis not present

## 2021-07-24 DIAGNOSIS — E890 Postprocedural hypothyroidism: Secondary | ICD-10-CM | POA: Diagnosis not present

## 2021-07-24 DIAGNOSIS — C73 Malignant neoplasm of thyroid gland: Secondary | ICD-10-CM | POA: Diagnosis not present

## 2021-07-24 DIAGNOSIS — F330 Major depressive disorder, recurrent, mild: Secondary | ICD-10-CM | POA: Diagnosis not present

## 2021-07-24 DIAGNOSIS — I119 Hypertensive heart disease without heart failure: Secondary | ICD-10-CM | POA: Diagnosis not present

## 2021-07-24 DIAGNOSIS — G3185 Corticobasal degeneration: Secondary | ICD-10-CM | POA: Diagnosis not present

## 2021-07-24 DIAGNOSIS — R482 Apraxia: Secondary | ICD-10-CM | POA: Diagnosis not present

## 2021-07-25 DIAGNOSIS — R482 Apraxia: Secondary | ICD-10-CM | POA: Diagnosis not present

## 2021-07-25 DIAGNOSIS — C73 Malignant neoplasm of thyroid gland: Secondary | ICD-10-CM | POA: Diagnosis not present

## 2021-07-25 DIAGNOSIS — E785 Hyperlipidemia, unspecified: Secondary | ICD-10-CM | POA: Diagnosis not present

## 2021-07-25 DIAGNOSIS — F330 Major depressive disorder, recurrent, mild: Secondary | ICD-10-CM | POA: Diagnosis not present

## 2021-07-25 DIAGNOSIS — E890 Postprocedural hypothyroidism: Secondary | ICD-10-CM | POA: Diagnosis not present

## 2021-07-25 DIAGNOSIS — I700 Atherosclerosis of aorta: Secondary | ICD-10-CM | POA: Diagnosis not present

## 2021-07-25 DIAGNOSIS — G3185 Corticobasal degeneration: Secondary | ICD-10-CM | POA: Diagnosis not present

## 2021-07-25 DIAGNOSIS — I119 Hypertensive heart disease without heart failure: Secondary | ICD-10-CM | POA: Diagnosis not present

## 2021-07-28 DIAGNOSIS — C73 Malignant neoplasm of thyroid gland: Secondary | ICD-10-CM | POA: Diagnosis not present

## 2021-07-28 DIAGNOSIS — F330 Major depressive disorder, recurrent, mild: Secondary | ICD-10-CM | POA: Diagnosis not present

## 2021-07-28 DIAGNOSIS — G3185 Corticobasal degeneration: Secondary | ICD-10-CM | POA: Diagnosis not present

## 2021-07-28 DIAGNOSIS — I119 Hypertensive heart disease without heart failure: Secondary | ICD-10-CM | POA: Diagnosis not present

## 2021-07-28 DIAGNOSIS — E890 Postprocedural hypothyroidism: Secondary | ICD-10-CM | POA: Diagnosis not present

## 2021-07-28 DIAGNOSIS — R482 Apraxia: Secondary | ICD-10-CM | POA: Diagnosis not present

## 2021-07-31 DIAGNOSIS — I119 Hypertensive heart disease without heart failure: Secondary | ICD-10-CM | POA: Diagnosis not present

## 2021-07-31 DIAGNOSIS — G3185 Corticobasal degeneration: Secondary | ICD-10-CM | POA: Diagnosis not present

## 2021-07-31 DIAGNOSIS — E890 Postprocedural hypothyroidism: Secondary | ICD-10-CM | POA: Diagnosis not present

## 2021-07-31 DIAGNOSIS — C73 Malignant neoplasm of thyroid gland: Secondary | ICD-10-CM | POA: Diagnosis not present

## 2021-07-31 DIAGNOSIS — F330 Major depressive disorder, recurrent, mild: Secondary | ICD-10-CM | POA: Diagnosis not present

## 2021-07-31 DIAGNOSIS — R482 Apraxia: Secondary | ICD-10-CM | POA: Diagnosis not present

## 2021-08-04 DIAGNOSIS — G3185 Corticobasal degeneration: Secondary | ICD-10-CM | POA: Diagnosis not present

## 2021-08-04 DIAGNOSIS — R482 Apraxia: Secondary | ICD-10-CM | POA: Diagnosis not present

## 2021-08-04 DIAGNOSIS — F330 Major depressive disorder, recurrent, mild: Secondary | ICD-10-CM | POA: Diagnosis not present

## 2021-08-04 DIAGNOSIS — C73 Malignant neoplasm of thyroid gland: Secondary | ICD-10-CM | POA: Diagnosis not present

## 2021-08-04 DIAGNOSIS — I119 Hypertensive heart disease without heart failure: Secondary | ICD-10-CM | POA: Diagnosis not present

## 2021-08-04 DIAGNOSIS — E890 Postprocedural hypothyroidism: Secondary | ICD-10-CM | POA: Diagnosis not present

## 2021-08-05 DIAGNOSIS — C73 Malignant neoplasm of thyroid gland: Secondary | ICD-10-CM | POA: Diagnosis not present

## 2021-08-05 DIAGNOSIS — G3185 Corticobasal degeneration: Secondary | ICD-10-CM | POA: Diagnosis not present

## 2021-08-05 DIAGNOSIS — E890 Postprocedural hypothyroidism: Secondary | ICD-10-CM | POA: Diagnosis not present

## 2021-08-05 DIAGNOSIS — F330 Major depressive disorder, recurrent, mild: Secondary | ICD-10-CM | POA: Diagnosis not present

## 2021-08-05 DIAGNOSIS — R482 Apraxia: Secondary | ICD-10-CM | POA: Diagnosis not present

## 2021-08-05 DIAGNOSIS — I119 Hypertensive heart disease without heart failure: Secondary | ICD-10-CM | POA: Diagnosis not present

## 2021-08-07 DIAGNOSIS — C73 Malignant neoplasm of thyroid gland: Secondary | ICD-10-CM | POA: Diagnosis not present

## 2021-08-07 DIAGNOSIS — E890 Postprocedural hypothyroidism: Secondary | ICD-10-CM | POA: Diagnosis not present

## 2021-08-07 DIAGNOSIS — F330 Major depressive disorder, recurrent, mild: Secondary | ICD-10-CM | POA: Diagnosis not present

## 2021-08-07 DIAGNOSIS — I119 Hypertensive heart disease without heart failure: Secondary | ICD-10-CM | POA: Diagnosis not present

## 2021-08-07 DIAGNOSIS — R482 Apraxia: Secondary | ICD-10-CM | POA: Diagnosis not present

## 2021-08-07 DIAGNOSIS — G3185 Corticobasal degeneration: Secondary | ICD-10-CM | POA: Diagnosis not present

## 2021-08-11 DIAGNOSIS — I119 Hypertensive heart disease without heart failure: Secondary | ICD-10-CM | POA: Diagnosis not present

## 2021-08-11 DIAGNOSIS — G3185 Corticobasal degeneration: Secondary | ICD-10-CM | POA: Diagnosis not present

## 2021-08-11 DIAGNOSIS — E890 Postprocedural hypothyroidism: Secondary | ICD-10-CM | POA: Diagnosis not present

## 2021-08-11 DIAGNOSIS — R482 Apraxia: Secondary | ICD-10-CM | POA: Diagnosis not present

## 2021-08-11 DIAGNOSIS — F330 Major depressive disorder, recurrent, mild: Secondary | ICD-10-CM | POA: Diagnosis not present

## 2021-08-11 DIAGNOSIS — C73 Malignant neoplasm of thyroid gland: Secondary | ICD-10-CM | POA: Diagnosis not present

## 2021-08-14 DIAGNOSIS — C73 Malignant neoplasm of thyroid gland: Secondary | ICD-10-CM | POA: Diagnosis not present

## 2021-08-14 DIAGNOSIS — F330 Major depressive disorder, recurrent, mild: Secondary | ICD-10-CM | POA: Diagnosis not present

## 2021-08-14 DIAGNOSIS — E890 Postprocedural hypothyroidism: Secondary | ICD-10-CM | POA: Diagnosis not present

## 2021-08-14 DIAGNOSIS — G3185 Corticobasal degeneration: Secondary | ICD-10-CM | POA: Diagnosis not present

## 2021-08-14 DIAGNOSIS — I119 Hypertensive heart disease without heart failure: Secondary | ICD-10-CM | POA: Diagnosis not present

## 2021-08-14 DIAGNOSIS — R482 Apraxia: Secondary | ICD-10-CM | POA: Diagnosis not present

## 2021-08-18 DIAGNOSIS — I119 Hypertensive heart disease without heart failure: Secondary | ICD-10-CM | POA: Diagnosis not present

## 2021-08-18 DIAGNOSIS — E890 Postprocedural hypothyroidism: Secondary | ICD-10-CM | POA: Diagnosis not present

## 2021-08-18 DIAGNOSIS — R482 Apraxia: Secondary | ICD-10-CM | POA: Diagnosis not present

## 2021-08-18 DIAGNOSIS — F330 Major depressive disorder, recurrent, mild: Secondary | ICD-10-CM | POA: Diagnosis not present

## 2021-08-18 DIAGNOSIS — G3185 Corticobasal degeneration: Secondary | ICD-10-CM | POA: Diagnosis not present

## 2021-08-18 DIAGNOSIS — C73 Malignant neoplasm of thyroid gland: Secondary | ICD-10-CM | POA: Diagnosis not present

## 2021-08-21 DIAGNOSIS — G3185 Corticobasal degeneration: Secondary | ICD-10-CM | POA: Diagnosis not present

## 2021-08-21 DIAGNOSIS — E890 Postprocedural hypothyroidism: Secondary | ICD-10-CM | POA: Diagnosis not present

## 2021-08-21 DIAGNOSIS — I119 Hypertensive heart disease without heart failure: Secondary | ICD-10-CM | POA: Diagnosis not present

## 2021-08-21 DIAGNOSIS — C73 Malignant neoplasm of thyroid gland: Secondary | ICD-10-CM | POA: Diagnosis not present

## 2021-08-21 DIAGNOSIS — R482 Apraxia: Secondary | ICD-10-CM | POA: Diagnosis not present

## 2021-08-21 DIAGNOSIS — F330 Major depressive disorder, recurrent, mild: Secondary | ICD-10-CM | POA: Diagnosis not present

## 2021-08-24 DIAGNOSIS — E785 Hyperlipidemia, unspecified: Secondary | ICD-10-CM | POA: Diagnosis not present

## 2021-08-24 DIAGNOSIS — I119 Hypertensive heart disease without heart failure: Secondary | ICD-10-CM | POA: Diagnosis not present

## 2021-08-24 DIAGNOSIS — E890 Postprocedural hypothyroidism: Secondary | ICD-10-CM | POA: Diagnosis not present

## 2021-08-24 DIAGNOSIS — R482 Apraxia: Secondary | ICD-10-CM | POA: Diagnosis not present

## 2021-08-24 DIAGNOSIS — F330 Major depressive disorder, recurrent, mild: Secondary | ICD-10-CM | POA: Diagnosis not present

## 2021-08-24 DIAGNOSIS — I700 Atherosclerosis of aorta: Secondary | ICD-10-CM | POA: Diagnosis not present

## 2021-08-24 DIAGNOSIS — C73 Malignant neoplasm of thyroid gland: Secondary | ICD-10-CM | POA: Diagnosis not present

## 2021-08-24 DIAGNOSIS — G3185 Corticobasal degeneration: Secondary | ICD-10-CM | POA: Diagnosis not present

## 2021-08-25 DIAGNOSIS — F330 Major depressive disorder, recurrent, mild: Secondary | ICD-10-CM | POA: Diagnosis not present

## 2021-08-25 DIAGNOSIS — R482 Apraxia: Secondary | ICD-10-CM | POA: Diagnosis not present

## 2021-08-25 DIAGNOSIS — E890 Postprocedural hypothyroidism: Secondary | ICD-10-CM | POA: Diagnosis not present

## 2021-08-25 DIAGNOSIS — G3185 Corticobasal degeneration: Secondary | ICD-10-CM | POA: Diagnosis not present

## 2021-08-25 DIAGNOSIS — C73 Malignant neoplasm of thyroid gland: Secondary | ICD-10-CM | POA: Diagnosis not present

## 2021-08-25 DIAGNOSIS — I119 Hypertensive heart disease without heart failure: Secondary | ICD-10-CM | POA: Diagnosis not present

## 2021-08-28 DIAGNOSIS — F330 Major depressive disorder, recurrent, mild: Secondary | ICD-10-CM | POA: Diagnosis not present

## 2021-08-28 DIAGNOSIS — E890 Postprocedural hypothyroidism: Secondary | ICD-10-CM | POA: Diagnosis not present

## 2021-08-28 DIAGNOSIS — R482 Apraxia: Secondary | ICD-10-CM | POA: Diagnosis not present

## 2021-08-28 DIAGNOSIS — C73 Malignant neoplasm of thyroid gland: Secondary | ICD-10-CM | POA: Diagnosis not present

## 2021-08-28 DIAGNOSIS — I119 Hypertensive heart disease without heart failure: Secondary | ICD-10-CM | POA: Diagnosis not present

## 2021-08-28 DIAGNOSIS — G3185 Corticobasal degeneration: Secondary | ICD-10-CM | POA: Diagnosis not present

## 2021-09-01 DIAGNOSIS — E890 Postprocedural hypothyroidism: Secondary | ICD-10-CM | POA: Diagnosis not present

## 2021-09-01 DIAGNOSIS — F330 Major depressive disorder, recurrent, mild: Secondary | ICD-10-CM | POA: Diagnosis not present

## 2021-09-01 DIAGNOSIS — C73 Malignant neoplasm of thyroid gland: Secondary | ICD-10-CM | POA: Diagnosis not present

## 2021-09-01 DIAGNOSIS — R482 Apraxia: Secondary | ICD-10-CM | POA: Diagnosis not present

## 2021-09-01 DIAGNOSIS — G3185 Corticobasal degeneration: Secondary | ICD-10-CM | POA: Diagnosis not present

## 2021-09-01 DIAGNOSIS — I119 Hypertensive heart disease without heart failure: Secondary | ICD-10-CM | POA: Diagnosis not present

## 2021-09-04 DIAGNOSIS — F330 Major depressive disorder, recurrent, mild: Secondary | ICD-10-CM | POA: Diagnosis not present

## 2021-09-04 DIAGNOSIS — C73 Malignant neoplasm of thyroid gland: Secondary | ICD-10-CM | POA: Diagnosis not present

## 2021-09-04 DIAGNOSIS — I119 Hypertensive heart disease without heart failure: Secondary | ICD-10-CM | POA: Diagnosis not present

## 2021-09-04 DIAGNOSIS — E890 Postprocedural hypothyroidism: Secondary | ICD-10-CM | POA: Diagnosis not present

## 2021-09-04 DIAGNOSIS — G3185 Corticobasal degeneration: Secondary | ICD-10-CM | POA: Diagnosis not present

## 2021-09-04 DIAGNOSIS — R482 Apraxia: Secondary | ICD-10-CM | POA: Diagnosis not present

## 2021-09-08 DIAGNOSIS — G3185 Corticobasal degeneration: Secondary | ICD-10-CM | POA: Diagnosis not present

## 2021-09-08 DIAGNOSIS — I119 Hypertensive heart disease without heart failure: Secondary | ICD-10-CM | POA: Diagnosis not present

## 2021-09-08 DIAGNOSIS — E890 Postprocedural hypothyroidism: Secondary | ICD-10-CM | POA: Diagnosis not present

## 2021-09-08 DIAGNOSIS — F330 Major depressive disorder, recurrent, mild: Secondary | ICD-10-CM | POA: Diagnosis not present

## 2021-09-08 DIAGNOSIS — C73 Malignant neoplasm of thyroid gland: Secondary | ICD-10-CM | POA: Diagnosis not present

## 2021-09-08 DIAGNOSIS — R482 Apraxia: Secondary | ICD-10-CM | POA: Diagnosis not present

## 2021-09-11 DIAGNOSIS — R482 Apraxia: Secondary | ICD-10-CM | POA: Diagnosis not present

## 2021-09-11 DIAGNOSIS — C73 Malignant neoplasm of thyroid gland: Secondary | ICD-10-CM | POA: Diagnosis not present

## 2021-09-11 DIAGNOSIS — G3185 Corticobasal degeneration: Secondary | ICD-10-CM | POA: Diagnosis not present

## 2021-09-11 DIAGNOSIS — E890 Postprocedural hypothyroidism: Secondary | ICD-10-CM | POA: Diagnosis not present

## 2021-09-11 DIAGNOSIS — I119 Hypertensive heart disease without heart failure: Secondary | ICD-10-CM | POA: Diagnosis not present

## 2021-09-11 DIAGNOSIS — F330 Major depressive disorder, recurrent, mild: Secondary | ICD-10-CM | POA: Diagnosis not present

## 2021-09-15 DIAGNOSIS — G3185 Corticobasal degeneration: Secondary | ICD-10-CM | POA: Diagnosis not present

## 2021-09-15 DIAGNOSIS — F330 Major depressive disorder, recurrent, mild: Secondary | ICD-10-CM | POA: Diagnosis not present

## 2021-09-15 DIAGNOSIS — R482 Apraxia: Secondary | ICD-10-CM | POA: Diagnosis not present

## 2021-09-15 DIAGNOSIS — C73 Malignant neoplasm of thyroid gland: Secondary | ICD-10-CM | POA: Diagnosis not present

## 2021-09-15 DIAGNOSIS — I119 Hypertensive heart disease without heart failure: Secondary | ICD-10-CM | POA: Diagnosis not present

## 2021-09-15 DIAGNOSIS — E890 Postprocedural hypothyroidism: Secondary | ICD-10-CM | POA: Diagnosis not present

## 2021-09-18 DIAGNOSIS — E890 Postprocedural hypothyroidism: Secondary | ICD-10-CM | POA: Diagnosis not present

## 2021-09-18 DIAGNOSIS — I119 Hypertensive heart disease without heart failure: Secondary | ICD-10-CM | POA: Diagnosis not present

## 2021-09-18 DIAGNOSIS — G3185 Corticobasal degeneration: Secondary | ICD-10-CM | POA: Diagnosis not present

## 2021-09-18 DIAGNOSIS — R482 Apraxia: Secondary | ICD-10-CM | POA: Diagnosis not present

## 2021-09-18 DIAGNOSIS — F330 Major depressive disorder, recurrent, mild: Secondary | ICD-10-CM | POA: Diagnosis not present

## 2021-09-18 DIAGNOSIS — C73 Malignant neoplasm of thyroid gland: Secondary | ICD-10-CM | POA: Diagnosis not present

## 2021-09-22 DIAGNOSIS — E890 Postprocedural hypothyroidism: Secondary | ICD-10-CM | POA: Diagnosis not present

## 2021-09-22 DIAGNOSIS — G3185 Corticobasal degeneration: Secondary | ICD-10-CM | POA: Diagnosis not present

## 2021-09-22 DIAGNOSIS — R482 Apraxia: Secondary | ICD-10-CM | POA: Diagnosis not present

## 2021-09-22 DIAGNOSIS — I119 Hypertensive heart disease without heart failure: Secondary | ICD-10-CM | POA: Diagnosis not present

## 2021-09-22 DIAGNOSIS — F330 Major depressive disorder, recurrent, mild: Secondary | ICD-10-CM | POA: Diagnosis not present

## 2021-09-22 DIAGNOSIS — C73 Malignant neoplasm of thyroid gland: Secondary | ICD-10-CM | POA: Diagnosis not present

## 2021-09-24 DIAGNOSIS — C73 Malignant neoplasm of thyroid gland: Secondary | ICD-10-CM | POA: Diagnosis not present

## 2021-09-24 DIAGNOSIS — I700 Atherosclerosis of aorta: Secondary | ICD-10-CM | POA: Diagnosis not present

## 2021-09-24 DIAGNOSIS — G3185 Corticobasal degeneration: Secondary | ICD-10-CM | POA: Diagnosis not present

## 2021-09-24 DIAGNOSIS — R482 Apraxia: Secondary | ICD-10-CM | POA: Diagnosis not present

## 2021-09-24 DIAGNOSIS — E785 Hyperlipidemia, unspecified: Secondary | ICD-10-CM | POA: Diagnosis not present

## 2021-09-24 DIAGNOSIS — Z66 Do not resuscitate: Secondary | ICD-10-CM | POA: Diagnosis not present

## 2021-09-24 DIAGNOSIS — I119 Hypertensive heart disease without heart failure: Secondary | ICD-10-CM | POA: Diagnosis not present

## 2021-09-24 DIAGNOSIS — F330 Major depressive disorder, recurrent, mild: Secondary | ICD-10-CM | POA: Diagnosis not present

## 2021-09-24 DIAGNOSIS — E890 Postprocedural hypothyroidism: Secondary | ICD-10-CM | POA: Diagnosis not present

## 2021-09-25 DIAGNOSIS — G3185 Corticobasal degeneration: Secondary | ICD-10-CM | POA: Diagnosis not present

## 2021-09-25 DIAGNOSIS — F330 Major depressive disorder, recurrent, mild: Secondary | ICD-10-CM | POA: Diagnosis not present

## 2021-09-25 DIAGNOSIS — E890 Postprocedural hypothyroidism: Secondary | ICD-10-CM | POA: Diagnosis not present

## 2021-09-25 DIAGNOSIS — I119 Hypertensive heart disease without heart failure: Secondary | ICD-10-CM | POA: Diagnosis not present

## 2021-09-25 DIAGNOSIS — C73 Malignant neoplasm of thyroid gland: Secondary | ICD-10-CM | POA: Diagnosis not present

## 2021-09-25 DIAGNOSIS — R482 Apraxia: Secondary | ICD-10-CM | POA: Diagnosis not present

## 2021-09-29 DIAGNOSIS — R482 Apraxia: Secondary | ICD-10-CM | POA: Diagnosis not present

## 2021-09-29 DIAGNOSIS — G3185 Corticobasal degeneration: Secondary | ICD-10-CM | POA: Diagnosis not present

## 2021-09-29 DIAGNOSIS — I119 Hypertensive heart disease without heart failure: Secondary | ICD-10-CM | POA: Diagnosis not present

## 2021-09-29 DIAGNOSIS — C73 Malignant neoplasm of thyroid gland: Secondary | ICD-10-CM | POA: Diagnosis not present

## 2021-09-29 DIAGNOSIS — E890 Postprocedural hypothyroidism: Secondary | ICD-10-CM | POA: Diagnosis not present

## 2021-09-29 DIAGNOSIS — F330 Major depressive disorder, recurrent, mild: Secondary | ICD-10-CM | POA: Diagnosis not present

## 2021-10-02 DIAGNOSIS — E890 Postprocedural hypothyroidism: Secondary | ICD-10-CM | POA: Diagnosis not present

## 2021-10-02 DIAGNOSIS — R482 Apraxia: Secondary | ICD-10-CM | POA: Diagnosis not present

## 2021-10-02 DIAGNOSIS — I119 Hypertensive heart disease without heart failure: Secondary | ICD-10-CM | POA: Diagnosis not present

## 2021-10-02 DIAGNOSIS — G3185 Corticobasal degeneration: Secondary | ICD-10-CM | POA: Diagnosis not present

## 2021-10-02 DIAGNOSIS — F330 Major depressive disorder, recurrent, mild: Secondary | ICD-10-CM | POA: Diagnosis not present

## 2021-10-02 DIAGNOSIS — C73 Malignant neoplasm of thyroid gland: Secondary | ICD-10-CM | POA: Diagnosis not present

## 2021-10-03 DIAGNOSIS — R482 Apraxia: Secondary | ICD-10-CM | POA: Diagnosis not present

## 2021-10-03 DIAGNOSIS — E890 Postprocedural hypothyroidism: Secondary | ICD-10-CM | POA: Diagnosis not present

## 2021-10-03 DIAGNOSIS — I119 Hypertensive heart disease without heart failure: Secondary | ICD-10-CM | POA: Diagnosis not present

## 2021-10-03 DIAGNOSIS — G3185 Corticobasal degeneration: Secondary | ICD-10-CM | POA: Diagnosis not present

## 2021-10-03 DIAGNOSIS — C73 Malignant neoplasm of thyroid gland: Secondary | ICD-10-CM | POA: Diagnosis not present

## 2021-10-03 DIAGNOSIS — F330 Major depressive disorder, recurrent, mild: Secondary | ICD-10-CM | POA: Diagnosis not present

## 2021-10-06 DIAGNOSIS — R482 Apraxia: Secondary | ICD-10-CM | POA: Diagnosis not present

## 2021-10-06 DIAGNOSIS — C73 Malignant neoplasm of thyroid gland: Secondary | ICD-10-CM | POA: Diagnosis not present

## 2021-10-06 DIAGNOSIS — I119 Hypertensive heart disease without heart failure: Secondary | ICD-10-CM | POA: Diagnosis not present

## 2021-10-06 DIAGNOSIS — G3185 Corticobasal degeneration: Secondary | ICD-10-CM | POA: Diagnosis not present

## 2021-10-06 DIAGNOSIS — E890 Postprocedural hypothyroidism: Secondary | ICD-10-CM | POA: Diagnosis not present

## 2021-10-06 DIAGNOSIS — F330 Major depressive disorder, recurrent, mild: Secondary | ICD-10-CM | POA: Diagnosis not present

## 2021-10-09 DIAGNOSIS — C73 Malignant neoplasm of thyroid gland: Secondary | ICD-10-CM | POA: Diagnosis not present

## 2021-10-09 DIAGNOSIS — R482 Apraxia: Secondary | ICD-10-CM | POA: Diagnosis not present

## 2021-10-09 DIAGNOSIS — G3185 Corticobasal degeneration: Secondary | ICD-10-CM | POA: Diagnosis not present

## 2021-10-09 DIAGNOSIS — I119 Hypertensive heart disease without heart failure: Secondary | ICD-10-CM | POA: Diagnosis not present

## 2021-10-09 DIAGNOSIS — E890 Postprocedural hypothyroidism: Secondary | ICD-10-CM | POA: Diagnosis not present

## 2021-10-09 DIAGNOSIS — F330 Major depressive disorder, recurrent, mild: Secondary | ICD-10-CM | POA: Diagnosis not present

## 2021-10-13 DIAGNOSIS — I119 Hypertensive heart disease without heart failure: Secondary | ICD-10-CM | POA: Diagnosis not present

## 2021-10-13 DIAGNOSIS — E890 Postprocedural hypothyroidism: Secondary | ICD-10-CM | POA: Diagnosis not present

## 2021-10-13 DIAGNOSIS — C73 Malignant neoplasm of thyroid gland: Secondary | ICD-10-CM | POA: Diagnosis not present

## 2021-10-13 DIAGNOSIS — G3185 Corticobasal degeneration: Secondary | ICD-10-CM | POA: Diagnosis not present

## 2021-10-13 DIAGNOSIS — R482 Apraxia: Secondary | ICD-10-CM | POA: Diagnosis not present

## 2021-10-13 DIAGNOSIS — F330 Major depressive disorder, recurrent, mild: Secondary | ICD-10-CM | POA: Diagnosis not present

## 2021-10-14 DIAGNOSIS — I119 Hypertensive heart disease without heart failure: Secondary | ICD-10-CM | POA: Diagnosis not present

## 2021-10-14 DIAGNOSIS — E890 Postprocedural hypothyroidism: Secondary | ICD-10-CM | POA: Diagnosis not present

## 2021-10-14 DIAGNOSIS — R482 Apraxia: Secondary | ICD-10-CM | POA: Diagnosis not present

## 2021-10-14 DIAGNOSIS — F330 Major depressive disorder, recurrent, mild: Secondary | ICD-10-CM | POA: Diagnosis not present

## 2021-10-14 DIAGNOSIS — C73 Malignant neoplasm of thyroid gland: Secondary | ICD-10-CM | POA: Diagnosis not present

## 2021-10-14 DIAGNOSIS — G3185 Corticobasal degeneration: Secondary | ICD-10-CM | POA: Diagnosis not present

## 2021-10-16 DIAGNOSIS — I119 Hypertensive heart disease without heart failure: Secondary | ICD-10-CM | POA: Diagnosis not present

## 2021-10-16 DIAGNOSIS — R482 Apraxia: Secondary | ICD-10-CM | POA: Diagnosis not present

## 2021-10-16 DIAGNOSIS — G3185 Corticobasal degeneration: Secondary | ICD-10-CM | POA: Diagnosis not present

## 2021-10-16 DIAGNOSIS — F330 Major depressive disorder, recurrent, mild: Secondary | ICD-10-CM | POA: Diagnosis not present

## 2021-10-16 DIAGNOSIS — E890 Postprocedural hypothyroidism: Secondary | ICD-10-CM | POA: Diagnosis not present

## 2021-10-16 DIAGNOSIS — C73 Malignant neoplasm of thyroid gland: Secondary | ICD-10-CM | POA: Diagnosis not present

## 2021-10-20 DIAGNOSIS — F330 Major depressive disorder, recurrent, mild: Secondary | ICD-10-CM | POA: Diagnosis not present

## 2021-10-20 DIAGNOSIS — R482 Apraxia: Secondary | ICD-10-CM | POA: Diagnosis not present

## 2021-10-20 DIAGNOSIS — I119 Hypertensive heart disease without heart failure: Secondary | ICD-10-CM | POA: Diagnosis not present

## 2021-10-20 DIAGNOSIS — G3185 Corticobasal degeneration: Secondary | ICD-10-CM | POA: Diagnosis not present

## 2021-10-20 DIAGNOSIS — E890 Postprocedural hypothyroidism: Secondary | ICD-10-CM | POA: Diagnosis not present

## 2021-10-20 DIAGNOSIS — C73 Malignant neoplasm of thyroid gland: Secondary | ICD-10-CM | POA: Diagnosis not present

## 2021-10-23 DIAGNOSIS — I119 Hypertensive heart disease without heart failure: Secondary | ICD-10-CM | POA: Diagnosis not present

## 2021-10-23 DIAGNOSIS — G3185 Corticobasal degeneration: Secondary | ICD-10-CM | POA: Diagnosis not present

## 2021-10-23 DIAGNOSIS — R482 Apraxia: Secondary | ICD-10-CM | POA: Diagnosis not present

## 2021-10-23 DIAGNOSIS — F330 Major depressive disorder, recurrent, mild: Secondary | ICD-10-CM | POA: Diagnosis not present

## 2021-10-23 DIAGNOSIS — E890 Postprocedural hypothyroidism: Secondary | ICD-10-CM | POA: Diagnosis not present

## 2021-10-23 DIAGNOSIS — C73 Malignant neoplasm of thyroid gland: Secondary | ICD-10-CM | POA: Diagnosis not present

## 2021-10-24 DIAGNOSIS — G3185 Corticobasal degeneration: Secondary | ICD-10-CM | POA: Diagnosis not present

## 2021-10-24 DIAGNOSIS — F330 Major depressive disorder, recurrent, mild: Secondary | ICD-10-CM | POA: Diagnosis not present

## 2021-10-24 DIAGNOSIS — I119 Hypertensive heart disease without heart failure: Secondary | ICD-10-CM | POA: Diagnosis not present

## 2021-10-24 DIAGNOSIS — E890 Postprocedural hypothyroidism: Secondary | ICD-10-CM | POA: Diagnosis not present

## 2021-10-24 DIAGNOSIS — E785 Hyperlipidemia, unspecified: Secondary | ICD-10-CM | POA: Diagnosis not present

## 2021-10-24 DIAGNOSIS — I700 Atherosclerosis of aorta: Secondary | ICD-10-CM | POA: Diagnosis not present

## 2021-10-24 DIAGNOSIS — R482 Apraxia: Secondary | ICD-10-CM | POA: Diagnosis not present

## 2021-10-24 DIAGNOSIS — C73 Malignant neoplasm of thyroid gland: Secondary | ICD-10-CM | POA: Diagnosis not present

## 2021-10-30 DIAGNOSIS — R482 Apraxia: Secondary | ICD-10-CM | POA: Diagnosis not present

## 2021-10-30 DIAGNOSIS — C73 Malignant neoplasm of thyroid gland: Secondary | ICD-10-CM | POA: Diagnosis not present

## 2021-10-30 DIAGNOSIS — E890 Postprocedural hypothyroidism: Secondary | ICD-10-CM | POA: Diagnosis not present

## 2021-10-30 DIAGNOSIS — F330 Major depressive disorder, recurrent, mild: Secondary | ICD-10-CM | POA: Diagnosis not present

## 2021-10-30 DIAGNOSIS — I119 Hypertensive heart disease without heart failure: Secondary | ICD-10-CM | POA: Diagnosis not present

## 2021-10-30 DIAGNOSIS — G3185 Corticobasal degeneration: Secondary | ICD-10-CM | POA: Diagnosis not present

## 2021-11-03 DIAGNOSIS — F330 Major depressive disorder, recurrent, mild: Secondary | ICD-10-CM | POA: Diagnosis not present

## 2021-11-03 DIAGNOSIS — G3185 Corticobasal degeneration: Secondary | ICD-10-CM | POA: Diagnosis not present

## 2021-11-03 DIAGNOSIS — I119 Hypertensive heart disease without heart failure: Secondary | ICD-10-CM | POA: Diagnosis not present

## 2021-11-03 DIAGNOSIS — R482 Apraxia: Secondary | ICD-10-CM | POA: Diagnosis not present

## 2021-11-03 DIAGNOSIS — C73 Malignant neoplasm of thyroid gland: Secondary | ICD-10-CM | POA: Diagnosis not present

## 2021-11-03 DIAGNOSIS — E890 Postprocedural hypothyroidism: Secondary | ICD-10-CM | POA: Diagnosis not present

## 2021-11-06 DIAGNOSIS — E890 Postprocedural hypothyroidism: Secondary | ICD-10-CM | POA: Diagnosis not present

## 2021-11-06 DIAGNOSIS — F330 Major depressive disorder, recurrent, mild: Secondary | ICD-10-CM | POA: Diagnosis not present

## 2021-11-06 DIAGNOSIS — G3185 Corticobasal degeneration: Secondary | ICD-10-CM | POA: Diagnosis not present

## 2021-11-06 DIAGNOSIS — I119 Hypertensive heart disease without heart failure: Secondary | ICD-10-CM | POA: Diagnosis not present

## 2021-11-06 DIAGNOSIS — R482 Apraxia: Secondary | ICD-10-CM | POA: Diagnosis not present

## 2021-11-06 DIAGNOSIS — C73 Malignant neoplasm of thyroid gland: Secondary | ICD-10-CM | POA: Diagnosis not present

## 2021-11-10 DIAGNOSIS — G3185 Corticobasal degeneration: Secondary | ICD-10-CM | POA: Diagnosis not present

## 2021-11-10 DIAGNOSIS — R482 Apraxia: Secondary | ICD-10-CM | POA: Diagnosis not present

## 2021-11-10 DIAGNOSIS — C73 Malignant neoplasm of thyroid gland: Secondary | ICD-10-CM | POA: Diagnosis not present

## 2021-11-10 DIAGNOSIS — E890 Postprocedural hypothyroidism: Secondary | ICD-10-CM | POA: Diagnosis not present

## 2021-11-10 DIAGNOSIS — F330 Major depressive disorder, recurrent, mild: Secondary | ICD-10-CM | POA: Diagnosis not present

## 2021-11-10 DIAGNOSIS — I119 Hypertensive heart disease without heart failure: Secondary | ICD-10-CM | POA: Diagnosis not present

## 2021-11-13 DIAGNOSIS — G3185 Corticobasal degeneration: Secondary | ICD-10-CM | POA: Diagnosis not present

## 2021-11-13 DIAGNOSIS — R482 Apraxia: Secondary | ICD-10-CM | POA: Diagnosis not present

## 2021-11-13 DIAGNOSIS — E890 Postprocedural hypothyroidism: Secondary | ICD-10-CM | POA: Diagnosis not present

## 2021-11-13 DIAGNOSIS — I119 Hypertensive heart disease without heart failure: Secondary | ICD-10-CM | POA: Diagnosis not present

## 2021-11-13 DIAGNOSIS — F330 Major depressive disorder, recurrent, mild: Secondary | ICD-10-CM | POA: Diagnosis not present

## 2021-11-13 DIAGNOSIS — C73 Malignant neoplasm of thyroid gland: Secondary | ICD-10-CM | POA: Diagnosis not present

## 2021-11-17 DIAGNOSIS — C73 Malignant neoplasm of thyroid gland: Secondary | ICD-10-CM | POA: Diagnosis not present

## 2021-11-17 DIAGNOSIS — G3185 Corticobasal degeneration: Secondary | ICD-10-CM | POA: Diagnosis not present

## 2021-11-17 DIAGNOSIS — F330 Major depressive disorder, recurrent, mild: Secondary | ICD-10-CM | POA: Diagnosis not present

## 2021-11-17 DIAGNOSIS — I119 Hypertensive heart disease without heart failure: Secondary | ICD-10-CM | POA: Diagnosis not present

## 2021-11-17 DIAGNOSIS — R482 Apraxia: Secondary | ICD-10-CM | POA: Diagnosis not present

## 2021-11-17 DIAGNOSIS — E890 Postprocedural hypothyroidism: Secondary | ICD-10-CM | POA: Diagnosis not present

## 2021-11-20 DIAGNOSIS — C73 Malignant neoplasm of thyroid gland: Secondary | ICD-10-CM | POA: Diagnosis not present

## 2021-11-20 DIAGNOSIS — G3185 Corticobasal degeneration: Secondary | ICD-10-CM | POA: Diagnosis not present

## 2021-11-20 DIAGNOSIS — R482 Apraxia: Secondary | ICD-10-CM | POA: Diagnosis not present

## 2021-11-20 DIAGNOSIS — F330 Major depressive disorder, recurrent, mild: Secondary | ICD-10-CM | POA: Diagnosis not present

## 2021-11-20 DIAGNOSIS — E890 Postprocedural hypothyroidism: Secondary | ICD-10-CM | POA: Diagnosis not present

## 2021-11-20 DIAGNOSIS — I119 Hypertensive heart disease without heart failure: Secondary | ICD-10-CM | POA: Diagnosis not present

## 2021-11-24 DIAGNOSIS — C73 Malignant neoplasm of thyroid gland: Secondary | ICD-10-CM | POA: Diagnosis not present

## 2021-11-24 DIAGNOSIS — G3185 Corticobasal degeneration: Secondary | ICD-10-CM | POA: Diagnosis not present

## 2021-11-24 DIAGNOSIS — I119 Hypertensive heart disease without heart failure: Secondary | ICD-10-CM | POA: Diagnosis not present

## 2021-11-24 DIAGNOSIS — R482 Apraxia: Secondary | ICD-10-CM | POA: Diagnosis not present

## 2021-11-24 DIAGNOSIS — F330 Major depressive disorder, recurrent, mild: Secondary | ICD-10-CM | POA: Diagnosis not present

## 2021-11-24 DIAGNOSIS — E890 Postprocedural hypothyroidism: Secondary | ICD-10-CM | POA: Diagnosis not present

## 2021-11-24 DIAGNOSIS — I700 Atherosclerosis of aorta: Secondary | ICD-10-CM | POA: Diagnosis not present

## 2021-11-24 DIAGNOSIS — E785 Hyperlipidemia, unspecified: Secondary | ICD-10-CM | POA: Diagnosis not present

## 2021-11-27 DIAGNOSIS — G3185 Corticobasal degeneration: Secondary | ICD-10-CM | POA: Diagnosis not present

## 2021-11-27 DIAGNOSIS — C73 Malignant neoplasm of thyroid gland: Secondary | ICD-10-CM | POA: Diagnosis not present

## 2021-11-27 DIAGNOSIS — F330 Major depressive disorder, recurrent, mild: Secondary | ICD-10-CM | POA: Diagnosis not present

## 2021-11-27 DIAGNOSIS — I119 Hypertensive heart disease without heart failure: Secondary | ICD-10-CM | POA: Diagnosis not present

## 2021-11-27 DIAGNOSIS — E890 Postprocedural hypothyroidism: Secondary | ICD-10-CM | POA: Diagnosis not present

## 2021-11-27 DIAGNOSIS — R482 Apraxia: Secondary | ICD-10-CM | POA: Diagnosis not present

## 2021-12-01 DIAGNOSIS — E890 Postprocedural hypothyroidism: Secondary | ICD-10-CM | POA: Diagnosis not present

## 2021-12-01 DIAGNOSIS — R482 Apraxia: Secondary | ICD-10-CM | POA: Diagnosis not present

## 2021-12-01 DIAGNOSIS — F330 Major depressive disorder, recurrent, mild: Secondary | ICD-10-CM | POA: Diagnosis not present

## 2021-12-01 DIAGNOSIS — G3185 Corticobasal degeneration: Secondary | ICD-10-CM | POA: Diagnosis not present

## 2021-12-01 DIAGNOSIS — C73 Malignant neoplasm of thyroid gland: Secondary | ICD-10-CM | POA: Diagnosis not present

## 2021-12-01 DIAGNOSIS — I119 Hypertensive heart disease without heart failure: Secondary | ICD-10-CM | POA: Diagnosis not present

## 2021-12-04 DIAGNOSIS — F330 Major depressive disorder, recurrent, mild: Secondary | ICD-10-CM | POA: Diagnosis not present

## 2021-12-04 DIAGNOSIS — E890 Postprocedural hypothyroidism: Secondary | ICD-10-CM | POA: Diagnosis not present

## 2021-12-04 DIAGNOSIS — G3185 Corticobasal degeneration: Secondary | ICD-10-CM | POA: Diagnosis not present

## 2021-12-04 DIAGNOSIS — I119 Hypertensive heart disease without heart failure: Secondary | ICD-10-CM | POA: Diagnosis not present

## 2021-12-04 DIAGNOSIS — R482 Apraxia: Secondary | ICD-10-CM | POA: Diagnosis not present

## 2021-12-04 DIAGNOSIS — C73 Malignant neoplasm of thyroid gland: Secondary | ICD-10-CM | POA: Diagnosis not present

## 2021-12-06 DIAGNOSIS — G3185 Corticobasal degeneration: Secondary | ICD-10-CM | POA: Diagnosis not present

## 2021-12-06 DIAGNOSIS — F330 Major depressive disorder, recurrent, mild: Secondary | ICD-10-CM | POA: Diagnosis not present

## 2021-12-06 DIAGNOSIS — I119 Hypertensive heart disease without heart failure: Secondary | ICD-10-CM | POA: Diagnosis not present

## 2021-12-06 DIAGNOSIS — R482 Apraxia: Secondary | ICD-10-CM | POA: Diagnosis not present

## 2021-12-06 DIAGNOSIS — C73 Malignant neoplasm of thyroid gland: Secondary | ICD-10-CM | POA: Diagnosis not present

## 2021-12-06 DIAGNOSIS — E890 Postprocedural hypothyroidism: Secondary | ICD-10-CM | POA: Diagnosis not present

## 2021-12-08 DIAGNOSIS — C73 Malignant neoplasm of thyroid gland: Secondary | ICD-10-CM | POA: Diagnosis not present

## 2021-12-08 DIAGNOSIS — R482 Apraxia: Secondary | ICD-10-CM | POA: Diagnosis not present

## 2021-12-08 DIAGNOSIS — G3185 Corticobasal degeneration: Secondary | ICD-10-CM | POA: Diagnosis not present

## 2021-12-08 DIAGNOSIS — E890 Postprocedural hypothyroidism: Secondary | ICD-10-CM | POA: Diagnosis not present

## 2021-12-08 DIAGNOSIS — F330 Major depressive disorder, recurrent, mild: Secondary | ICD-10-CM | POA: Diagnosis not present

## 2021-12-08 DIAGNOSIS — I119 Hypertensive heart disease without heart failure: Secondary | ICD-10-CM | POA: Diagnosis not present

## 2021-12-11 DIAGNOSIS — C73 Malignant neoplasm of thyroid gland: Secondary | ICD-10-CM | POA: Diagnosis not present

## 2021-12-11 DIAGNOSIS — F330 Major depressive disorder, recurrent, mild: Secondary | ICD-10-CM | POA: Diagnosis not present

## 2021-12-11 DIAGNOSIS — E890 Postprocedural hypothyroidism: Secondary | ICD-10-CM | POA: Diagnosis not present

## 2021-12-11 DIAGNOSIS — R482 Apraxia: Secondary | ICD-10-CM | POA: Diagnosis not present

## 2021-12-11 DIAGNOSIS — G3185 Corticobasal degeneration: Secondary | ICD-10-CM | POA: Diagnosis not present

## 2021-12-11 DIAGNOSIS — I119 Hypertensive heart disease without heart failure: Secondary | ICD-10-CM | POA: Diagnosis not present

## 2021-12-15 DIAGNOSIS — C73 Malignant neoplasm of thyroid gland: Secondary | ICD-10-CM | POA: Diagnosis not present

## 2021-12-15 DIAGNOSIS — E890 Postprocedural hypothyroidism: Secondary | ICD-10-CM | POA: Diagnosis not present

## 2021-12-15 DIAGNOSIS — F330 Major depressive disorder, recurrent, mild: Secondary | ICD-10-CM | POA: Diagnosis not present

## 2021-12-15 DIAGNOSIS — I119 Hypertensive heart disease without heart failure: Secondary | ICD-10-CM | POA: Diagnosis not present

## 2021-12-15 DIAGNOSIS — G3185 Corticobasal degeneration: Secondary | ICD-10-CM | POA: Diagnosis not present

## 2021-12-15 DIAGNOSIS — R482 Apraxia: Secondary | ICD-10-CM | POA: Diagnosis not present

## 2021-12-17 DIAGNOSIS — G3185 Corticobasal degeneration: Secondary | ICD-10-CM | POA: Diagnosis not present

## 2021-12-17 DIAGNOSIS — I119 Hypertensive heart disease without heart failure: Secondary | ICD-10-CM | POA: Diagnosis not present

## 2021-12-17 DIAGNOSIS — C73 Malignant neoplasm of thyroid gland: Secondary | ICD-10-CM | POA: Diagnosis not present

## 2021-12-17 DIAGNOSIS — R482 Apraxia: Secondary | ICD-10-CM | POA: Diagnosis not present

## 2021-12-17 DIAGNOSIS — E890 Postprocedural hypothyroidism: Secondary | ICD-10-CM | POA: Diagnosis not present

## 2021-12-17 DIAGNOSIS — F330 Major depressive disorder, recurrent, mild: Secondary | ICD-10-CM | POA: Diagnosis not present

## 2021-12-18 DIAGNOSIS — I119 Hypertensive heart disease without heart failure: Secondary | ICD-10-CM | POA: Diagnosis not present

## 2021-12-18 DIAGNOSIS — G3185 Corticobasal degeneration: Secondary | ICD-10-CM | POA: Diagnosis not present

## 2021-12-18 DIAGNOSIS — E890 Postprocedural hypothyroidism: Secondary | ICD-10-CM | POA: Diagnosis not present

## 2021-12-18 DIAGNOSIS — R482 Apraxia: Secondary | ICD-10-CM | POA: Diagnosis not present

## 2021-12-18 DIAGNOSIS — C73 Malignant neoplasm of thyroid gland: Secondary | ICD-10-CM | POA: Diagnosis not present

## 2021-12-18 DIAGNOSIS — F330 Major depressive disorder, recurrent, mild: Secondary | ICD-10-CM | POA: Diagnosis not present

## 2021-12-22 DIAGNOSIS — R482 Apraxia: Secondary | ICD-10-CM | POA: Diagnosis not present

## 2021-12-22 DIAGNOSIS — G3185 Corticobasal degeneration: Secondary | ICD-10-CM | POA: Diagnosis not present

## 2021-12-22 DIAGNOSIS — E890 Postprocedural hypothyroidism: Secondary | ICD-10-CM | POA: Diagnosis not present

## 2021-12-22 DIAGNOSIS — I119 Hypertensive heart disease without heart failure: Secondary | ICD-10-CM | POA: Diagnosis not present

## 2021-12-22 DIAGNOSIS — F330 Major depressive disorder, recurrent, mild: Secondary | ICD-10-CM | POA: Diagnosis not present

## 2021-12-22 DIAGNOSIS — C73 Malignant neoplasm of thyroid gland: Secondary | ICD-10-CM | POA: Diagnosis not present

## 2021-12-24 DIAGNOSIS — G3185 Corticobasal degeneration: Secondary | ICD-10-CM | POA: Diagnosis not present

## 2021-12-24 DIAGNOSIS — F330 Major depressive disorder, recurrent, mild: Secondary | ICD-10-CM | POA: Diagnosis not present

## 2021-12-24 DIAGNOSIS — R482 Apraxia: Secondary | ICD-10-CM | POA: Diagnosis not present

## 2021-12-24 DIAGNOSIS — E890 Postprocedural hypothyroidism: Secondary | ICD-10-CM | POA: Diagnosis not present

## 2021-12-24 DIAGNOSIS — I119 Hypertensive heart disease without heart failure: Secondary | ICD-10-CM | POA: Diagnosis not present

## 2021-12-24 DIAGNOSIS — C73 Malignant neoplasm of thyroid gland: Secondary | ICD-10-CM | POA: Diagnosis not present

## 2021-12-25 DIAGNOSIS — E890 Postprocedural hypothyroidism: Secondary | ICD-10-CM | POA: Diagnosis not present

## 2021-12-25 DIAGNOSIS — R482 Apraxia: Secondary | ICD-10-CM | POA: Diagnosis not present

## 2021-12-25 DIAGNOSIS — E785 Hyperlipidemia, unspecified: Secondary | ICD-10-CM | POA: Diagnosis not present

## 2021-12-25 DIAGNOSIS — I700 Atherosclerosis of aorta: Secondary | ICD-10-CM | POA: Diagnosis not present

## 2021-12-25 DIAGNOSIS — C73 Malignant neoplasm of thyroid gland: Secondary | ICD-10-CM | POA: Diagnosis not present

## 2021-12-25 DIAGNOSIS — G3185 Corticobasal degeneration: Secondary | ICD-10-CM | POA: Diagnosis not present

## 2021-12-25 DIAGNOSIS — I119 Hypertensive heart disease without heart failure: Secondary | ICD-10-CM | POA: Diagnosis not present

## 2021-12-25 DIAGNOSIS — F330 Major depressive disorder, recurrent, mild: Secondary | ICD-10-CM | POA: Diagnosis not present

## 2021-12-26 DIAGNOSIS — E890 Postprocedural hypothyroidism: Secondary | ICD-10-CM | POA: Diagnosis not present

## 2021-12-26 DIAGNOSIS — R482 Apraxia: Secondary | ICD-10-CM | POA: Diagnosis not present

## 2021-12-26 DIAGNOSIS — I119 Hypertensive heart disease without heart failure: Secondary | ICD-10-CM | POA: Diagnosis not present

## 2021-12-26 DIAGNOSIS — C73 Malignant neoplasm of thyroid gland: Secondary | ICD-10-CM | POA: Diagnosis not present

## 2021-12-26 DIAGNOSIS — G3185 Corticobasal degeneration: Secondary | ICD-10-CM | POA: Diagnosis not present

## 2021-12-26 DIAGNOSIS — F330 Major depressive disorder, recurrent, mild: Secondary | ICD-10-CM | POA: Diagnosis not present

## 2021-12-28 DIAGNOSIS — G3185 Corticobasal degeneration: Secondary | ICD-10-CM | POA: Diagnosis not present

## 2021-12-28 DIAGNOSIS — F330 Major depressive disorder, recurrent, mild: Secondary | ICD-10-CM | POA: Diagnosis not present

## 2021-12-28 DIAGNOSIS — I119 Hypertensive heart disease without heart failure: Secondary | ICD-10-CM | POA: Diagnosis not present

## 2021-12-28 DIAGNOSIS — E890 Postprocedural hypothyroidism: Secondary | ICD-10-CM | POA: Diagnosis not present

## 2021-12-28 DIAGNOSIS — R482 Apraxia: Secondary | ICD-10-CM | POA: Diagnosis not present

## 2021-12-28 DIAGNOSIS — C73 Malignant neoplasm of thyroid gland: Secondary | ICD-10-CM | POA: Diagnosis not present

## 2021-12-29 DIAGNOSIS — E890 Postprocedural hypothyroidism: Secondary | ICD-10-CM | POA: Diagnosis not present

## 2021-12-29 DIAGNOSIS — C73 Malignant neoplasm of thyroid gland: Secondary | ICD-10-CM | POA: Diagnosis not present

## 2021-12-29 DIAGNOSIS — R482 Apraxia: Secondary | ICD-10-CM | POA: Diagnosis not present

## 2021-12-29 DIAGNOSIS — I119 Hypertensive heart disease without heart failure: Secondary | ICD-10-CM | POA: Diagnosis not present

## 2021-12-29 DIAGNOSIS — G3185 Corticobasal degeneration: Secondary | ICD-10-CM | POA: Diagnosis not present

## 2021-12-29 DIAGNOSIS — F330 Major depressive disorder, recurrent, mild: Secondary | ICD-10-CM | POA: Diagnosis not present

## 2021-12-30 DIAGNOSIS — I119 Hypertensive heart disease without heart failure: Secondary | ICD-10-CM | POA: Diagnosis not present

## 2021-12-30 DIAGNOSIS — F330 Major depressive disorder, recurrent, mild: Secondary | ICD-10-CM | POA: Diagnosis not present

## 2021-12-30 DIAGNOSIS — C73 Malignant neoplasm of thyroid gland: Secondary | ICD-10-CM | POA: Diagnosis not present

## 2021-12-30 DIAGNOSIS — G3185 Corticobasal degeneration: Secondary | ICD-10-CM | POA: Diagnosis not present

## 2021-12-30 DIAGNOSIS — E890 Postprocedural hypothyroidism: Secondary | ICD-10-CM | POA: Diagnosis not present

## 2021-12-30 DIAGNOSIS — R482 Apraxia: Secondary | ICD-10-CM | POA: Diagnosis not present

## 2022-01-01 DIAGNOSIS — C73 Malignant neoplasm of thyroid gland: Secondary | ICD-10-CM | POA: Diagnosis not present

## 2022-01-01 DIAGNOSIS — E890 Postprocedural hypothyroidism: Secondary | ICD-10-CM | POA: Diagnosis not present

## 2022-01-01 DIAGNOSIS — R482 Apraxia: Secondary | ICD-10-CM | POA: Diagnosis not present

## 2022-01-01 DIAGNOSIS — I119 Hypertensive heart disease without heart failure: Secondary | ICD-10-CM | POA: Diagnosis not present

## 2022-01-01 DIAGNOSIS — F330 Major depressive disorder, recurrent, mild: Secondary | ICD-10-CM | POA: Diagnosis not present

## 2022-01-01 DIAGNOSIS — G3185 Corticobasal degeneration: Secondary | ICD-10-CM | POA: Diagnosis not present

## 2022-01-04 DIAGNOSIS — R482 Apraxia: Secondary | ICD-10-CM | POA: Diagnosis not present

## 2022-01-04 DIAGNOSIS — E890 Postprocedural hypothyroidism: Secondary | ICD-10-CM | POA: Diagnosis not present

## 2022-01-04 DIAGNOSIS — C73 Malignant neoplasm of thyroid gland: Secondary | ICD-10-CM | POA: Diagnosis not present

## 2022-01-04 DIAGNOSIS — F330 Major depressive disorder, recurrent, mild: Secondary | ICD-10-CM | POA: Diagnosis not present

## 2022-01-04 DIAGNOSIS — G3185 Corticobasal degeneration: Secondary | ICD-10-CM | POA: Diagnosis not present

## 2022-01-04 DIAGNOSIS — I119 Hypertensive heart disease without heart failure: Secondary | ICD-10-CM | POA: Diagnosis not present

## 2022-01-05 DIAGNOSIS — E890 Postprocedural hypothyroidism: Secondary | ICD-10-CM | POA: Diagnosis not present

## 2022-01-05 DIAGNOSIS — G3185 Corticobasal degeneration: Secondary | ICD-10-CM | POA: Diagnosis not present

## 2022-01-05 DIAGNOSIS — R482 Apraxia: Secondary | ICD-10-CM | POA: Diagnosis not present

## 2022-01-05 DIAGNOSIS — I119 Hypertensive heart disease without heart failure: Secondary | ICD-10-CM | POA: Diagnosis not present

## 2022-01-05 DIAGNOSIS — C73 Malignant neoplasm of thyroid gland: Secondary | ICD-10-CM | POA: Diagnosis not present

## 2022-01-05 DIAGNOSIS — F330 Major depressive disorder, recurrent, mild: Secondary | ICD-10-CM | POA: Diagnosis not present

## 2022-01-06 DIAGNOSIS — R482 Apraxia: Secondary | ICD-10-CM | POA: Diagnosis not present

## 2022-01-06 DIAGNOSIS — C73 Malignant neoplasm of thyroid gland: Secondary | ICD-10-CM | POA: Diagnosis not present

## 2022-01-06 DIAGNOSIS — F330 Major depressive disorder, recurrent, mild: Secondary | ICD-10-CM | POA: Diagnosis not present

## 2022-01-06 DIAGNOSIS — G3185 Corticobasal degeneration: Secondary | ICD-10-CM | POA: Diagnosis not present

## 2022-01-06 DIAGNOSIS — I119 Hypertensive heart disease without heart failure: Secondary | ICD-10-CM | POA: Diagnosis not present

## 2022-01-06 DIAGNOSIS — E890 Postprocedural hypothyroidism: Secondary | ICD-10-CM | POA: Diagnosis not present

## 2022-01-07 DIAGNOSIS — C73 Malignant neoplasm of thyroid gland: Secondary | ICD-10-CM | POA: Diagnosis not present

## 2022-01-07 DIAGNOSIS — I119 Hypertensive heart disease without heart failure: Secondary | ICD-10-CM | POA: Diagnosis not present

## 2022-01-07 DIAGNOSIS — E890 Postprocedural hypothyroidism: Secondary | ICD-10-CM | POA: Diagnosis not present

## 2022-01-07 DIAGNOSIS — F330 Major depressive disorder, recurrent, mild: Secondary | ICD-10-CM | POA: Diagnosis not present

## 2022-01-07 DIAGNOSIS — G3185 Corticobasal degeneration: Secondary | ICD-10-CM | POA: Diagnosis not present

## 2022-01-07 DIAGNOSIS — R482 Apraxia: Secondary | ICD-10-CM | POA: Diagnosis not present

## 2022-01-08 DIAGNOSIS — C73 Malignant neoplasm of thyroid gland: Secondary | ICD-10-CM | POA: Diagnosis not present

## 2022-01-08 DIAGNOSIS — F330 Major depressive disorder, recurrent, mild: Secondary | ICD-10-CM | POA: Diagnosis not present

## 2022-01-08 DIAGNOSIS — I119 Hypertensive heart disease without heart failure: Secondary | ICD-10-CM | POA: Diagnosis not present

## 2022-01-08 DIAGNOSIS — E890 Postprocedural hypothyroidism: Secondary | ICD-10-CM | POA: Diagnosis not present

## 2022-01-08 DIAGNOSIS — G3185 Corticobasal degeneration: Secondary | ICD-10-CM | POA: Diagnosis not present

## 2022-01-08 DIAGNOSIS — R482 Apraxia: Secondary | ICD-10-CM | POA: Diagnosis not present

## 2022-01-09 DIAGNOSIS — R482 Apraxia: Secondary | ICD-10-CM | POA: Diagnosis not present

## 2022-01-09 DIAGNOSIS — E890 Postprocedural hypothyroidism: Secondary | ICD-10-CM | POA: Diagnosis not present

## 2022-01-09 DIAGNOSIS — C73 Malignant neoplasm of thyroid gland: Secondary | ICD-10-CM | POA: Diagnosis not present

## 2022-01-09 DIAGNOSIS — I119 Hypertensive heart disease without heart failure: Secondary | ICD-10-CM | POA: Diagnosis not present

## 2022-01-09 DIAGNOSIS — F330 Major depressive disorder, recurrent, mild: Secondary | ICD-10-CM | POA: Diagnosis not present

## 2022-01-09 DIAGNOSIS — G3185 Corticobasal degeneration: Secondary | ICD-10-CM | POA: Diagnosis not present

## 2022-01-10 DIAGNOSIS — I119 Hypertensive heart disease without heart failure: Secondary | ICD-10-CM | POA: Diagnosis not present

## 2022-01-10 DIAGNOSIS — E890 Postprocedural hypothyroidism: Secondary | ICD-10-CM | POA: Diagnosis not present

## 2022-01-10 DIAGNOSIS — F330 Major depressive disorder, recurrent, mild: Secondary | ICD-10-CM | POA: Diagnosis not present

## 2022-01-10 DIAGNOSIS — R482 Apraxia: Secondary | ICD-10-CM | POA: Diagnosis not present

## 2022-01-10 DIAGNOSIS — G3185 Corticobasal degeneration: Secondary | ICD-10-CM | POA: Diagnosis not present

## 2022-01-10 DIAGNOSIS — C73 Malignant neoplasm of thyroid gland: Secondary | ICD-10-CM | POA: Diagnosis not present

## 2022-01-11 DIAGNOSIS — E890 Postprocedural hypothyroidism: Secondary | ICD-10-CM | POA: Diagnosis not present

## 2022-01-11 DIAGNOSIS — F330 Major depressive disorder, recurrent, mild: Secondary | ICD-10-CM | POA: Diagnosis not present

## 2022-01-11 DIAGNOSIS — I119 Hypertensive heart disease without heart failure: Secondary | ICD-10-CM | POA: Diagnosis not present

## 2022-01-11 DIAGNOSIS — R482 Apraxia: Secondary | ICD-10-CM | POA: Diagnosis not present

## 2022-01-11 DIAGNOSIS — G3185 Corticobasal degeneration: Secondary | ICD-10-CM | POA: Diagnosis not present

## 2022-01-11 DIAGNOSIS — C73 Malignant neoplasm of thyroid gland: Secondary | ICD-10-CM | POA: Diagnosis not present

## 2022-01-12 DIAGNOSIS — E890 Postprocedural hypothyroidism: Secondary | ICD-10-CM | POA: Diagnosis not present

## 2022-01-12 DIAGNOSIS — I119 Hypertensive heart disease without heart failure: Secondary | ICD-10-CM | POA: Diagnosis not present

## 2022-01-12 DIAGNOSIS — C73 Malignant neoplasm of thyroid gland: Secondary | ICD-10-CM | POA: Diagnosis not present

## 2022-01-12 DIAGNOSIS — R482 Apraxia: Secondary | ICD-10-CM | POA: Diagnosis not present

## 2022-01-12 DIAGNOSIS — F330 Major depressive disorder, recurrent, mild: Secondary | ICD-10-CM | POA: Diagnosis not present

## 2022-01-12 DIAGNOSIS — G3185 Corticobasal degeneration: Secondary | ICD-10-CM | POA: Diagnosis not present

## 2022-01-13 DIAGNOSIS — C73 Malignant neoplasm of thyroid gland: Secondary | ICD-10-CM | POA: Diagnosis not present

## 2022-01-13 DIAGNOSIS — E890 Postprocedural hypothyroidism: Secondary | ICD-10-CM | POA: Diagnosis not present

## 2022-01-13 DIAGNOSIS — F330 Major depressive disorder, recurrent, mild: Secondary | ICD-10-CM | POA: Diagnosis not present

## 2022-01-13 DIAGNOSIS — R482 Apraxia: Secondary | ICD-10-CM | POA: Diagnosis not present

## 2022-01-13 DIAGNOSIS — I119 Hypertensive heart disease without heart failure: Secondary | ICD-10-CM | POA: Diagnosis not present

## 2022-01-13 DIAGNOSIS — G3185 Corticobasal degeneration: Secondary | ICD-10-CM | POA: Diagnosis not present

## 2022-01-24 DEATH — deceased

## 2022-02-22 ENCOUNTER — Encounter (INDEPENDENT_AMBULATORY_CARE_PROVIDER_SITE_OTHER): Payer: Self-pay
